# Patient Record
Sex: Female | Born: 1990 | Race: White | Hispanic: No | Marital: Single
Health system: Midwestern US, Academic
[De-identification: ages and names within clinical notes are randomized; demographics above are authoritative.]

## PROBLEM LIST (undated history)

## (undated) DIAGNOSIS — E041 Nontoxic single thyroid nodule: Secondary | ICD-10-CM

## (undated) DIAGNOSIS — R87619 Unspecified abnormal cytological findings in specimens from cervix uteri: Secondary | ICD-10-CM

## (undated) DIAGNOSIS — L309 Dermatitis, unspecified: Secondary | ICD-10-CM

## (undated) DIAGNOSIS — T7840XA Allergy, unspecified, initial encounter: Secondary | ICD-10-CM

## (undated) DIAGNOSIS — Z8489 Family history of other specified conditions: Secondary | ICD-10-CM

## (undated) HISTORY — DX: Dermatitis, unspecified: L30.9

## (undated) HISTORY — DX: Allergy, unspecified, initial encounter: T78.40XA

## (undated) HISTORY — DX: Unspecified abnormal cytological findings in specimens from cervix uteri: R87.619

## (undated) HISTORY — DX: Nontoxic single thyroid nodule: E04.1

## (undated) HISTORY — PX: TYMPANOSTOMY TUBE PLACEMENT: SHX32

---
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
## 2012-05-03 DIAGNOSIS — M67929 Unspecified disorder of synovium and tendon, unspecified upper arm: Secondary | ICD-10-CM

## 2012-05-05 ENCOUNTER — Ambulatory Visit: Payer: PRIVATE HEALTH INSURANCE

## 2012-05-06 ENCOUNTER — Ambulatory Visit: Admit: 2012-05-06 | Payer: PRIVATE HEALTH INSURANCE

## 2012-05-06 DIAGNOSIS — M67929 Unspecified disorder of synovium and tendon, unspecified upper arm: Secondary | ICD-10-CM

## 2012-05-06 NOTE — Unmapped (Signed)
Subjective  HPI:   Patient ID: Debbie Fuller is a 21 y.o. female.    Chief Complaint:  Arm Pain   Incident onset: 05/03/2012. The incident occurred at the gym (after a work out the day prior). There was no injury mechanism. The pain is present in the right forearm and upper right arm. The quality of the pain is described as aching and stabbing. The pain does not radiate. The pain is at a severity of 6/10. The pain is moderate. The pain has been constant since the incident. Pertinent negatives include no chest pain, muscle weakness, numbness or tingling. Exacerbated by: extension of elbow. She has tried NSAIDs for the symptoms. The treatment provided mild relief.                   ROS:   Review of Systems   Constitutional: Negative for fever, chills and diaphoresis.   Respiratory: Negative for cough and shortness of breath.    Cardiovascular: Negative for chest pain and leg swelling.   Gastrointestinal: Negative for nausea and vomiting.   Genitourinary: Negative for dysuria.   Musculoskeletal: Negative for back pain and joint swelling.   Neurological: Negative for tingling and numbness.          Objective:   Physical Exam   Vitals reviewed.  Constitutional: She appears well-nourished. No distress.   HENT:   Head: Normocephalic and atraumatic.   Cardiovascular: Normal rate, regular rhythm and normal heart sounds.    Pulmonary/Chest: Effort normal and breath sounds normal. No respiratory distress. She has no wheezes.   Musculoskeletal:        Right shoulder: Normal.        Left shoulder: Normal.        Right elbow: She exhibits decreased range of motion (decreaased extension due to pain). She exhibits no swelling, no effusion, no deformity and no laceration. tenderness (mild, over distal bicipital tendon) found.        Left elbow: Normal.        Right wrist: Normal.             Filed Vitals:    05/06/12 0932   BP: 86/54   Temp: 98.6 ??F (37 ??C)   TempSrc: Oral   Height: 5' 4.5 (1.638 m)   Weight: 155 lb (70.308  kg)     Body mass index is 26.19 kg/(m^2).  Body surface area is 1.79 meters squared.                Assessment/Plan:     Right bicipital tendinitis  - She has Naprosyn at home, advised her to take two OTC Naprosyn tablets twice a day for 7-10 days and avoid heavy lifting and exercise.

## 2012-05-16 ENCOUNTER — Ambulatory Visit: Admit: 2012-05-16 | Payer: PRIVATE HEALTH INSURANCE

## 2012-05-16 DIAGNOSIS — N926 Irregular menstruation, unspecified: Secondary | ICD-10-CM

## 2012-05-16 MED ORDER — norethindrone-ethinyl estradiol (LOESTRIN 1/20, 21,) 1-20 mg-mcg per tablet
1-20 | PACK | Freq: Every day | ORAL | 1.00 refills | 84.00000 days | Status: AC
Start: 2012-05-16 — End: 2013-04-19

## 2012-05-16 NOTE — Unmapped (Addendum)
Oral Contraceptives  Advantages and Disadvantages  Oral contraceptives (OC's) are the first choice of many women who use birth control. Before starting OC's, a complete history of family and medical problems and a physical exam with a Pap test should be done. Your caregiver may recommend other tests before taking oral contraceptives.  THE ADVANTAGES OF ORAL CONTRACEPTIVES ARE:   ?? They do not interfere with sexual intercourse.   ?? They are the most effective means of birth control (99.5% effective).   ?? They do not affect becoming pregnant when discontinued using them to get pregnant.   ?? They are effective for use in emergency contraception (morning after pill).   ?? They help regulate periods in women with menstrual disorders, and help with severe pain (dysmenorrhea), absence of periods (amenorrhea), and heavy bleeding (menorrhagia).   ?? They can help prevent low red blood cells (anemia) in women who have heavy menstrual periods.   ?? They are helpful in treating polycystic ovary syndrome (multiple tiny cysts on the ovary that do not ovulate and have very irregular menstrual periods).   ?? They are helpful in reducing premenstrual symptoms and for treating endometriosis (uterine tissue misplaced outside its normal location).   ?? There is some evidence that the progestin (synthetic hormone) in OC's may reduce the risk of ovarian, endometrial and possibly colorectal cancers. Progestin seems to be the protective factor in OC's. Some experts believe that women at risk for ovarian cancer might consider oral contraceptives with the highest progestin dose. The longer a woman is on oral contraceptives, the greater the protection.   ?? Progestin OC???s can be taken safely when breastfeeding.   ?? They provide some protection against ectopic pregnancy, ovarian cysts, pelvic inflammatory disease (PID), benign breast lumps, fibrocystic breast disease and rheumatoid arthritis.   ?? Certain low-dose OC's improve acne.   ?? Taking  low-dose OC's in the years before menopause (peri-menopause) may also help protect women against anemia and osteoporosis. The estrogen hormone lowers the risk of osteoporosis.   ?? Although the effect of OC's on bone density is still unclear, some increase in bone density in women taking combination OC's with norethindrone (although not with desogestrel). Low-dose OC's may help prevent bone loss when a woman approaches menopause.   ?? Healthy, nonsmoking women who have frequent physical exams and do not have currant problems taking OC???s can take OC???s up to age 55.   SIDE EFFECTS AND DISADVANTAGES OF ORAL CONTRACEPTIVES  ?? Estrogen and progesterone have different side effects. Women on the combined pill may experience different effects from those on the progestin-only pill. The most serious side effects are due to the estrogen in the combined pill. Women at risk can usually take progestin-only OC's.   ?? Symptoms of serious problems include:   ?? Feeling sick to your stomach (nausea) and vomiting.  ?? Severe abdominal pain.   ?? Chest pain.   ?? Unusual headaches including migraine headaches.   ?? Visual disturbances.   ?? Tiredness and dizziness.  ?? Depression.  ?? Decrease in sex drive (libido).   ?? Amenorrhea (never menstruated by the age of 16).   ?? Breakthrough bleeding.   ?? Yeast infections of the vagina.   ?? Breast tenderness.   ?? Acne.  ?? Patches of brown skin on the face (chloasma).  ?? Rash.   ?? Trouble using contact lenses.   ?? Loss of scalp hair.   ?? Increase hair growth of the face and chest.   ??   Severe pain or swelling in the legs.    ?? OC???s should be used with caution in women over the age of 35.  Severe uncommon complications of OC's include:  ?? High blood pressure can happen. This can usually be corrected by discontinuing the medication. Women who use OC's should not be overly concerned. There is a question if OC's may cause a small but persistent increase in diastolic blood pressure (the second number in a  blood pressure reading or pressure when your heart is resting), which may increase the risk for heart disease years later.   ?? They increase the risk for blood clots. The risk is higher in women with inherited clotting defects. The risk is very slight if there are no other risk factors or if taking anticoagulant therapy.   ?? There may be a higher risk for heart attacks in women taking OC's. Smoking is a significant risk factor in these women. It may not be as big of a risk in healthy women who do not smoke. Chances for a heart attack is also higher in OC users who have high blood pressure, unhealthy cholesterol levels, or both.   ?? There may be a higher than normal risk for stroke in women taking OC's even if women have no other stroke risk factors. Risk of having a stroke is still low. Smoking, migraines and high blood pressure add considerably to the risk.   ?? Use may increase breast or endometrial cancer. If you have a family history of breast cancer and are at a higher risk for developing breast cancer, you should be sure to have regular physical examinations, mammograms and to perform self-examinations every month after your menstrual period.   ?? Use can cause gall bladder disease, benign tumors of the liver, and blood clot in the lungs (pulmonary embolisms).   WOMEN WHO SHOULD NOT TAKE OC???S:  ?? Women who are pregnant, trying to get pregnant or think they may be pregnant.   ?? Women who have a history of blood clots or other blood problems.   ?? Women who have had a heart attack or heart disease should not take the combination estrogen/progesterone OC???s. The progestin only OC may be appropriate with close watching by your caregiver.   ?? Women who have had a stroke.   ?? Women who have migraine headaches.   ?? Women who had yellowing of the skin (jaundice) during pregnancy or with taking OC???s in the past.   ?? Women with undiagnosed vaginal bleeding.   ?? Women who smoke.   ?? Women who have or had a history for breast  or endometrial cancer.   ?? Women who are nursing should not take the combination estrogen/progesterone OC???s.   ?? OC???s should not be used for at least a month before having major surgery.   The risk of having any of these problems is very small.   ?? You should talk with your caregivers to see if OC's are safe for you.   ?? Recommendations for birth control use change continually. There many other options for birth control that are safe and effective depending on your situation and preference. Discuss your choices with your caregivers and decide what is best for you. You may not be able to take certain OC's. Always read the information you are given. Check to find out if there are new recommendations or guidelines for you.   Document Released: 10/03/2004 Document Re-Released: 02/19/2009  Exit[employer]?? Patient Information ??2012 [employer].

## 2012-05-16 NOTE — Unmapped (Signed)
Likely a vagal episode  reassurance

## 2012-05-16 NOTE — Unmapped (Signed)
Trial of ocp's r/b reviewed

## 2012-05-16 NOTE — Unmapped (Signed)
Subjective  HPI:   Patient ID: Debbie Fuller is a 21 y.o. female.    Chief Complaint:  HPI Comments: Here after a spell  Drinking a shake with a friend  Choked and felt out of it  Eyes rolled back  Could hear radio and friend calling her name  Easily came back and had no fatigue or loss of bowel or bladder      Also would like to begin ocp's  For period reguation                  ROS:   Review of Systems   Constitutional: Negative for diaphoresis, activity change, appetite change, fatigue and unexpected weight change.   HENT: Negative for rhinorrhea and postnasal drip.    Respiratory: Negative for shortness of breath.    Cardiovascular: Negative for chest pain.   Musculoskeletal: Negative for back pain and arthralgias.   Neurological: Negative for numbness.   Psychiatric/Behavioral: Negative for dysphoric mood and decreased concentration.   All other systems reviewed and are negative.           Objective:   Physical Exam   Nursing note and vitals reviewed.  Constitutional: She is oriented to person, place, and time. She appears well-developed and well-nourished. No distress.   HENT:   Head: Normocephalic and atraumatic.   Nose: Nose normal.   Mouth/Throat: No oropharyngeal exudate.   Eyes: EOM are normal. Pupils are equal, round, and reactive to light. No scleral icterus.   Neck: Normal range of motion. Neck supple.   Cardiovascular: Normal rate and regular rhythm.    No murmur heard.  Pulmonary/Chest: Effort normal and breath sounds normal. No respiratory distress. She exhibits no tenderness.   Abdominal: Soft. Bowel sounds are normal. She exhibits no distension.   Musculoskeletal: Normal range of motion. She exhibits no edema.   Neurological: She is alert and oriented to person, place, and time.   Skin: Skin is warm and dry. No erythema.   Psychiatric: She has a normal mood and affect. Her behavior is normal. Thought content normal.             Filed Vitals:    05/16/12 1131   BP: 94/56   Pulse: 66      Height: 5' 4.57 (1.64 m)   Weight: 153 lb (69.4 kg)     Body mass index is 25.80 kg/(m^2).  Body surface area is 1.78 meters squared.                Assessment/Plan:     Patient Active Problem List   Diagnosis   ??? Vocal cord disease   ??? Laryngeal spasm   ??? Dyspnea   ??? Routine general medical examination at a health care facility

## 2012-11-02 ENCOUNTER — Ambulatory Visit: Admit: 2012-11-02 | Discharge: 2012-11-02 | Payer: PRIVATE HEALTH INSURANCE | Attending: Adult Health

## 2012-11-02 DIAGNOSIS — D229 Melanocytic nevi, unspecified: Secondary | ICD-10-CM

## 2012-11-02 NOTE — Unmapped (Signed)
Subjective  HPI:   Patient ID: Debbie Fuller is a 21 y.o. female.    Chief Complaint:  Chief Complaint   Patient presents with   ??? FSE       1. Moles on skin surface x many years.  Most have been there as long as she can remember.  All asymptomatic.  Denies any changes.  No prior treatments.  No mod factors.  Denies any personal or family hx of MM.    Derm History: NPV  (- ) Hx NMSC or MM  (- ) sunburns easily  (+ ) uses sunscreen  (- ) history of tanning bed use    ROS:   Review of Systems   Constitutional: Negative for fever and chills.   Respiratory: Negative for cough and shortness of breath.    Neurological: Negative for light-headedness and headaches.   Hematological: Does not bruise/bleed easily.   Reports seasonal allergies      Objective:   Physical Exam  Derm Physical Exam: FSE    Examination was performed of the following were unremarkable: scalp/hair, head/face, conjunctivae/eyelids, gums/teeth/lips, neck, breast/axilla/chest, abdomen, back, RUE, LUE, RLE, LLE, nails/digits and genitalia/groin/buttocks    1. Face, neck, trunk, and extremities with few scattered tan and brown 2-32mm macules and papules     Assessment/Plan:     1. Multiple Nevi  - Education and reassurance  -No atypical features today - rtc for any changes/concerns   -Skin cancer education, ABCDE's reviewed       -Sun protection education, new sunscreen labeling guidelines reviewed  - annual FSE

## 2013-04-19 MED ORDER — GILDESS 1-20 mg-mcg per tablet
1-20 | ORAL_TABLET | ORAL | 3.00 refills | 63.00000 days | Status: AC
Start: 2013-04-19 — End: 2013-05-02

## 2013-05-02 MED ORDER — norethindrone-ethinyl estradiol (GILDESS) 1-20 mg-mcg per tablet
1-20 | ORAL_TABLET | Freq: Every day | ORAL | 1.00 refills | 84.00000 days | Status: AC
Start: 2013-05-02 — End: 2014-06-11

## 2013-05-02 NOTE — Unmapped (Signed)
Pt is wanting to know if we could write for 84 which is 4 pack of the bc pill. pls call pharm back.

## 2013-06-05 NOTE — Unmapped (Signed)
Dr Prescilla Sours would be options for GYN

## 2013-06-05 NOTE — Unmapped (Signed)
Patient's father called and set up a physical for daughter with dr Laural Benes. Patient is scheduled for 07/25/13. Father stating patient would also like a referral to and OB/GN doctor within [facility] preferably a female unless it is booked out far then she may opt for a female

## 2013-06-06 NOTE — Unmapped (Signed)
Pt advised

## 2013-07-25 ENCOUNTER — Ambulatory Visit: Admit: 2013-07-25 | Payer: PRIVATE HEALTH INSURANCE

## 2013-07-25 ENCOUNTER — Inpatient Hospital Stay: Admit: 2013-07-25 | Payer: PRIVATE HEALTH INSURANCE

## 2013-07-25 DIAGNOSIS — Z0000 Encounter for general adult medical examination without abnormal findings: Secondary | ICD-10-CM

## 2013-07-25 LAB — COMPREHENSIVE METABOLIC PANEL, SERUM
ALT: 29 U/L (ref 13–69)
AST (SGOT): 23 U/L (ref 11–53)
Albumin: 4.3 g/dL (ref 3.6–5.1)
Alkaline Phosphatase: 50 U/L (ref 33–115)
Anion Gap: 14 mmol/L (ref 3–16)
BUN: 17 mg/dL (ref 7–25)
CO2: 26 mmol/L (ref 21–33)
Calcium: 9.7 mg/dL (ref 8.6–10.2)
Chloride: 103 mmol/L (ref 98–110)
Creatinine: 0.73 mg/dL (ref 0.50–1.20)
GFR MDRD Af Amer: 123 See note.
GFR MDRD Non Af Amer: 102 See note.
Glucose: 82 mg/dL (ref 65–99)
Osmolality, Calculated: 297 mOsm/kg (ref 278–305)
Potassium: 4.2 mmol/L (ref 3.5–5.3)
Sodium: 143 mmol/L (ref 135–146)
Total Bilirubin: 0.6 mg/dL (ref 0.2–1.2)
Total Protein: 7 g/dL (ref 6.2–8.3)

## 2013-07-25 LAB — LIPID PANEL
Cholesterol, Total: 191 mg/dL (ref 0–200)
HDL: 70 mg/dL (ref 30–60)
LDL Cholesterol: 98 mg/dL (ref 0–160)
Triglycerides: 115 mg/dL (ref 10–150)

## 2013-07-25 MED ORDER — benzoyl peroxide (BPO) 8 % Gel
8 | Freq: Every day | TOPICAL | 3.00 refills | 30.00000 days | Status: AC
Start: 2013-07-25 — End: 2016-09-01

## 2013-07-25 MED ORDER — fluticasone (FLONASE) 50 mcg/actuation nasal spray
50 | Freq: Every day | NASAL | Status: AC
Start: 2013-07-25 — End: 2013-11-28

## 2013-07-25 NOTE — Unmapped (Signed)
Subjective  HPI:   Patient ID: Debbie Fuller is a 22 y.o. female.    Chief Complaint:  HPI Comments: Here for cpe  Feels well  No recent labs             Medications:  Current Outpatient Prescriptions   Medication Sig Dispense Refill   ??? benzoyl peroxide (BPO) 8 % Gel Apply 1 drop topically daily.  90 g  0   ??? cetirizine (ZYRTEC) 10 MG tablet Take 10 mg by mouth daily.       ??? norethindrone-ethinyl estradiol (GILDESS) 1-20 mg-mcg per tablet Take 1 tablet by mouth daily.  84 tablet  2   ??? fluticasone (FLONASE) 50 mcg/actuation nasal spray Use 1 spray into each nostril daily.  16 g  11     No current facility-administered medications for this visit.        ROS:   Review of Systems   Constitutional: Negative for diaphoresis, activity change, appetite change, fatigue and unexpected weight change.   HENT: Negative for rhinorrhea and postnasal drip.    Respiratory: Negative for shortness of breath.    Cardiovascular: Negative for chest pain.   Musculoskeletal: Negative for back pain and arthralgias.   Neurological: Negative for numbness.   Psychiatric/Behavioral: Negative for dysphoric mood and decreased concentration.   All other systems reviewed and are negative.           Objective:   Physical Exam   Nursing note and vitals reviewed.  Constitutional: She is oriented to person, place, and time. She appears well-developed and well-nourished. No distress.   HENT:   Head: Normocephalic and atraumatic.   Right Ear: External ear normal.   Left Ear: External ear normal.   Eyes: Conjunctivae and EOM are normal. Pupils are equal, round, and reactive to light. Right eye exhibits no discharge. Left eye exhibits no discharge.   Neck: Normal range of motion. Neck supple. No thyromegaly present.   Cardiovascular: Normal rate, regular rhythm, normal heart sounds and intact distal pulses.    No murmur heard.  Pulmonary/Chest: Effort normal and breath sounds normal. No respiratory distress.   Normal breast exam no mass       Abdominal: Soft. Bowel sounds are normal. She exhibits no distension. There is no tenderness. There is no rebound.   Musculoskeletal: Normal range of motion.   Neurological: She is alert and oriented to person, place, and time. She has normal reflexes. No cranial nerve deficit.   Skin: Skin is warm and dry.   Psychiatric: She has a normal mood and affect. Her behavior is normal. Thought content normal.             Filed Vitals:    07/25/13 1019   BP: 90/46   Temp: 98.6 ??F (37 ??C)   TempSrc: Oral   Height: 5' 4 (1.626 m)   Weight: 150 lb 3.2 oz (68.13 kg)     Body mass index is 25.77 kg/(m^2).  Body surface area is 1.75 meters squared.                Assessment/Plan:     Patient Active Problem List   Diagnosis   ??? Routine general medical examination at a health care facility   ??? Menstrual irregularity

## 2013-07-25 NOTE — Unmapped (Signed)
Well adult  Obtain fasting labs  We need a copy of immunization records

## 2013-11-28 ENCOUNTER — Ambulatory Visit: Admit: 2013-11-28 | Payer: PRIVATE HEALTH INSURANCE

## 2013-11-28 DIAGNOSIS — N926 Irregular menstruation, unspecified: Secondary | ICD-10-CM

## 2013-11-28 MED ORDER — fluticasone (FLONASE) 50 mcg/actuation nasal spray
50 | Freq: Every day | NASAL | Status: AC
Start: 2013-11-28 — End: ?

## 2013-11-28 MED ORDER — levonorgestrel-ethinyl estradiol (AVIANE) 0.1-20 mg-mcg per tablet
0.1-20 | PACK | Freq: Every day | ORAL | 1.00 refills | 84.00000 days | Status: AC
Start: 2013-11-28 — End: 2014-06-11

## 2013-11-28 NOTE — Unmapped (Signed)
Resume flonase

## 2013-11-28 NOTE — Unmapped (Signed)
Trial of aviane

## 2013-11-28 NOTE — Unmapped (Signed)
Subjective  HPI:   Patient ID: Debbie Fuller is a 22 y.o. female.    Chief Complaint:  HPI Comments: Here for follow up  Accepted in OT school  Needs vaccines  Also discuss birth control  Recently sexually active  Occasional break thru bleeding  Would like to discuss other birth control options    Needs refills on flonase               Medications:  Current Outpatient Prescriptions   Medication Sig Dispense Refill   ??? benzoyl peroxide (BPO) 8 % Gel Apply 1 drop topically daily.  90 g  0   ??? cetirizine (ZYRTEC) 10 MG tablet Take 10 mg by mouth daily.       ??? fluticasone (FLONASE) 50 mcg/actuation nasal spray Use 1 spray into each nostril daily.  48 g  2   ??? levonorgestrel-ethinyl estradiol (AVIANE) 0.1-20 mg-mcg per tablet Take 1 tablet by mouth daily.  3 packet  2   ??? norethindrone-ethinyl estradiol (GILDESS) 1-20 mg-mcg per tablet Take 1 tablet by mouth daily.  84 tablet  2     No current facility-administered medications for this visit.        ROS:   Review of Systems   Constitutional: Negative for diaphoresis, activity change, appetite change, fatigue and unexpected weight change.   HENT: Negative for postnasal drip and rhinorrhea.    Respiratory: Negative for shortness of breath.    Cardiovascular: Negative for chest pain.   Musculoskeletal: Negative for arthralgias and back pain.   Neurological: Negative for numbness.   Psychiatric/Behavioral: Negative for dysphoric mood and decreased concentration.   All other systems reviewed and are negative.           Objective:   Physical Exam   Nursing note and vitals reviewed.  Constitutional: She is oriented to person, place, and time. She appears well-developed and well-nourished. No distress.   HENT:   Head: Normocephalic and atraumatic.   Nose: Nose normal.   Mouth/Throat: No oropharyngeal exudate.   Eyes: EOM are normal. Pupils are equal, round, and reactive to light. No scleral icterus.   Neck: Normal range of motion. Neck supple.   Cardiovascular: Normal  rate and regular rhythm.    No murmur heard.  Pulmonary/Chest: Effort normal and breath sounds normal. No respiratory distress. She exhibits no tenderness.   Abdominal: Soft. Bowel sounds are normal. She exhibits no distension.   Musculoskeletal: Normal range of motion. She exhibits no edema.   Neurological: She is alert and oriented to person, place, and time.   Skin: Skin is warm and dry. No erythema.   Psychiatric: She has a normal mood and affect. Her behavior is normal. Thought content normal.             Filed Vitals:    11/28/13 1106   BP: 130/70   Temp: 98.6 ??F (37 ??C)   TempSrc: Oral   Height: 5' 4 (1.626 m)   Weight: 145 lb (65.772 kg)     Body mass index is 24.88 kg/(m^2).  Body surface area is 1.72 meters squared.                Assessment/Plan:     Patient Active Problem List   Diagnosis   ??? Routine general medical examination at a health care facility   ??? Menstrual irregularity

## 2014-06-11 MED ORDER — levonorgestrel-ethinyl estradiol (LUTERA, 28,) 0.1-20 mg-mcg per tablet
0.1-20 | PACK | Freq: Every day | ORAL | 1.00 refills | 84.00000 days | Status: AC
Start: 2014-06-11 — End: 2015-06-21

## 2014-06-11 NOTE — Unmapped (Signed)
This is her contraception.   Med sent.

## 2014-06-11 NOTE — Unmapped (Signed)
Refill request for Lutera. I do not see this on her med list.

## 2015-06-21 ENCOUNTER — Ambulatory Visit: Admit: 2015-06-21 | Payer: PRIVATE HEALTH INSURANCE

## 2015-06-21 DIAGNOSIS — Q181 Preauricular sinus and cyst: Secondary | ICD-10-CM

## 2015-06-21 MED ORDER — fluocinonide (LIDEX) 0.05 % cream
0.05 | Freq: Two times a day (BID) | TOPICAL | Status: AC
Start: 2015-06-21 — End: ?

## 2015-06-21 NOTE — Unmapped (Signed)
Subjective  HPI:   Patient ID: Debbie Fuller is a 24 y.o. female.    Chief Complaint: Skin issue on ear  HPI         About 1.5 months ago, she had a cyst on right ear. It hasn't resolved. There is a blue tint to it. It is not painful. No discharge. No redness, warmth. Does not wear ear rings.    She didn't take any APAP or NSAID for it.     She has no history of skin cancer.    She has contact dermatitis in posterior knees. Usually uses a steroid cream. She has several environmental allergies, dog and cat allergies. Rash is itchy.       ROS:   Review of Systems   Constitutional: Negative for fever and chills.   HENT: Positive for rhinorrhea. Negative for ear discharge and ear pain.    Respiratory: Negative for cough.           Objective:   Physical Exam   Vitals reviewed.  Constitutional: She appears well-nourished. No distress.   HENT:   On right ear lobe, there is a cyst - non tender, non erythematous, no discharge. It is soft and skin colored.    Skin:   Erythematous maculopapular rash posterior to both knees.             Filed Vitals:    06/21/15 0951   BP: 114/72   Height: 5' 4 (1.626 m)   Weight: 145 lb (65.772 kg)     Body mass index is 24.88 kg/(m^2).  Body surface area is 1.72 meters squared.                Assessment/Plan:           1. Cyst on right ear lobe  - Benign. No signs of infection or abscess. Non tender. Provided reassurance. Educated her on signs of infection.    2. Contact dermatitis due to plants  - Start Lidex cream as needed.

## 2015-07-08 DIAGNOSIS — R87619 Unspecified abnormal cytological findings in specimens from cervix uteri: Secondary | ICD-10-CM

## 2015-07-08 HISTORY — DX: Unspecified abnormal cytological findings in specimens from cervix uteri: R87.619

## 2015-07-30 ENCOUNTER — Ambulatory Visit: Admit: 2015-07-30 | Payer: PRIVATE HEALTH INSURANCE

## 2015-07-30 DIAGNOSIS — Z124 Encounter for screening for malignant neoplasm of cervix: Secondary | ICD-10-CM

## 2015-07-30 MED ORDER — olopatadine (PATANOL) 0.1 % ophthalmic solution
0.1 | Freq: Two times a day (BID) | OPHTHALMIC | 1.00 refills | 25.00000 days | Status: AC
Start: 2015-07-30 — End: ?

## 2015-07-30 NOTE — Unmapped (Signed)
Subjective  HPI:   Patient ID: Debbie Fuller is a 24 y.o. female.    Chief Complaint:  HPI Comments: Here for cpe  Had nexplanon  Had pap then   Had abnormal cells  No periods  Told hpv positive  Did have gardisil               Medications:  Current Outpatient Prescriptions   Medication Sig Dispense Refill   ??? benzoyl peroxide (BPO) 8 % Gel Apply 1 drop topically daily. 90 g 0   ??? cetirizine (ZYRTEC) 10 MG tablet Take 10 mg by mouth daily.     ??? fluocinonide (LIDEX) 0.05 % cream Apply topically 2 times a day. 30 g 2   ??? fluticasone (FLONASE) 50 mcg/actuation nasal spray Use 1 spray into each nostril daily. 48 g 2     No current facility-administered medications for this visit.        ROS:   Review of Systems   Constitutional: Negative for diaphoresis, activity change, appetite change and fatigue.   HENT: Negative for postnasal drip and rhinorrhea.    Respiratory: Negative for shortness of breath.    Cardiovascular: Negative for chest pain.   Musculoskeletal: Negative for back pain and arthralgias.   Neurological: Negative for numbness.   Psychiatric/Behavioral: Negative for dysphoric mood and decreased concentration.   All other systems reviewed and are negative.         Objective:   Physical Exam   Nursing note and vitals reviewed.  Constitutional: She is oriented to person, place, and time. She appears well-developed and well-nourished. No distress.   HENT:   Head: Normocephalic and atraumatic.   Right Ear: External ear normal.   Left Ear: External ear normal.   Eyes: Conjunctivae and EOM are normal. Pupils are equal, round, and reactive to light. Right eye exhibits no discharge. Left eye exhibits no discharge.   Neck: Normal range of motion. Neck supple. No thyromegaly present.   Cardiovascular: Normal rate, regular rhythm, normal heart sounds and intact distal pulses.    No murmur heard.  Pulmonary/Chest: Effort normal and breath sounds normal. No respiratory distress.   Normal breast exam no mass     Abdominal: Soft. Bowel sounds are normal. She exhibits no distension. There is no tenderness. There is no rebound.   Genitourinary: Vagina normal and uterus normal. No vaginal discharge found.   Musculoskeletal: Normal range of motion.   Neurological: She is alert and oriented to person, place, and time. She has normal reflexes. No cranial nerve deficit.   Skin: Skin is warm and dry.   Psychiatric: She has a normal mood and affect. Her behavior is normal. Thought content normal.             Filed Vitals:    07/30/15 1028   BP: 102/80   Height: 5' 4 (1.626 m)   Weight: 140 lb (63.504 kg)     Body mass index is 24.02 kg/(m^2).  Body surface area is 1.69 meters squared.                Assessment/Plan:     Patient Active Problem List   Diagnosis   ??? Routine general medical examination at a health care facility   ??? Menstrual irregularity   ??? Allergic rhinitis        Screening for cervical cancer  Pap sent  Hm utd  Safe sex reviewed

## 2015-07-30 NOTE — Unmapped (Signed)
Pap sent  Hm utd  Safe sex reviewed

## 2016-09-01 ENCOUNTER — Ambulatory Visit: Admit: 2016-09-01 | Discharge: 2016-09-01 | Payer: PRIVATE HEALTH INSURANCE

## 2016-09-01 DIAGNOSIS — Z0000 Encounter for general adult medical examination without abnormal findings: Secondary | ICD-10-CM

## 2016-09-01 NOTE — Assessment & Plan Note (Signed)
Well adult  Hm utd  Update flu today  Obtain record of tetanus

## 2016-09-01 NOTE — Progress Notes (Signed)
[facility]  [facility]  [address]  [place] [address]    Name:  Debbie Fuller Date of Birth: 01/20/1991 (25 y.o.)   MRN: [id number]    Date of Service:  09/01/2016    Subjective   Chief Complaint:     Chief Complaint   Patient presents with    Annual Exam       History of Present Illness:   Debbie Fuller is a(n) 25 y.o. female here today for the following:     Here for cpe  Doing well  Just graduated with OT degree  Feels well          Current Outpatient Medications:  Current Outpatient Prescriptions   Medication Sig Dispense Refill    cetirizine (ZYRTEC) 10 MG tablet Take 10 mg by mouth daily.      fluocinonide (LIDEX) 0.05 % cream Apply topically 2 times a day. 30 g 2    fluticasone (FLONASE) 50 mcg/actuation nasal spray Use 1 spray into each nostril daily. 48 g 2    olopatadine (PATANOL) 0.1 % ophthalmic solution Place 1 drop into both eyes 2 times a day. 5 mL 6     No current facility-administered medications for this visit.          ROS:   Review of Systems   Constitutional: Negative for activity change, appetite change, diaphoresis and fatigue.   HENT: Negative for postnasal drip and rhinorrhea.    Respiratory: Negative for shortness of breath.    Cardiovascular: Negative for chest pain.   Musculoskeletal: Negative for arthralgias and back pain.   Neurological: Negative for numbness.   Psychiatric/Behavioral: Negative for decreased concentration and dysphoric mood.   All other systems reviewed and are negative.         Objective:     Vitals:    09/01/16 1302   BP: 100/70   BP Location: Left arm   Patient Position: Sitting   BP Cuff Size: Regular   Pulse: 96   Weight: 148 lb (67.1 kg)   Height: 5' 4.17 (1.63 m)     Body mass index is 25.27 kg/m.    Physical Exam   Nursing note and vitals reviewed.  Constitutional: She is oriented to person, place, and time. She appears well-developed and well-nourished. No distress.   HENT:   Head:  Normocephalic and atraumatic.   Nose: Nose normal.   Mouth/Throat: No oropharyngeal exudate.   Eyes: EOM are normal. Pupils are equal, round, and reactive to light. No scleral icterus.   Neck: Normal range of motion. Neck supple.   Cardiovascular: Normal rate and regular rhythm.    No murmur heard.  Pulmonary/Chest: Effort normal and breath sounds normal. No respiratory distress. She exhibits no tenderness.   Abdominal: Soft. Bowel sounds are normal. She exhibits no distension.   Musculoskeletal: Normal range of motion. She exhibits no edema.   Neurological: She is alert and oriented to person, place, and time.   Skin: Skin is warm and dry. No erythema.   Psychiatric: She has a normal mood and affect. Her behavior is normal. Thought content normal.             Assessment/Plan:   Debbie Fuller was seen today for annual exam.    Diagnoses and all orders for this visit:    Needs flu shot  -     Flu vaccine greater than or equal to 3yo preservative free Quadrivalent IM  Routine general medical examination at a health care facility    Routine general medical examination at a health care facility  Well adult  Hm utd  Update flu today  Obtain record of tetanus      No Follow-up on file.       Chelsea Aus, MD

## 2016-11-03 ENCOUNTER — Ambulatory Visit: Admit: 2016-11-03 | Discharge: 2016-11-03 | Payer: PRIVATE HEALTH INSURANCE | Attending: Dermatology

## 2016-11-03 DIAGNOSIS — D239 Other benign neoplasm of skin, unspecified: Secondary | ICD-10-CM

## 2016-11-03 NOTE — Patient Instructions (Signed)
OTC -Benzyl peroxide 5% - washing after working out. This can bleach towels, use an old towel. It can burn as well

## 2016-11-03 NOTE — Progress Notes (Signed)
[facility]  [place], [place], [place]       Brenda Fuller  1991/02/16    25 y.o. female     Date of Visit: 11/03/2016    Chief Complaint:   Chief Complaint   Patient presents with   ??? New Patient   ??? Mole     on right side of face, has raised. A month ago, the mole was painful. Not anymore however.         I was asked to see this patient by Dr. No ref. provider found.    History of Present Illness:  Brenda Fuller is a 25 y.o. female who presents with the chief complaint of establish care and the following:    1. Has a raised mole to her right jaw that has been present for decades.  About one month ago, the mole seemed to be irritated and sore.  The symptoms have since ceased.  She denies changes in color, size, or shape  2.  Complains of acne to her back present for years with intermittent flares, worse after exercising. Denies involvement to chest, back, face, shoulders. She requests OTC treatment only.     Review of Systems:  Constitutional: Reports general sense of well-being   Skin: No new or changing moles, no history of keloids or hypertrophic scars.  Heme: No abnormal bruising or bleeding.    Past Medical History, Family History, Surgical History, Medications and Allergies reviewed.    Past Skin Hx:   Patient denies past history of melanoma, NMSC, dysplastic nevi, or chronic skin rashes.    PFHx- maternal grandmother-skin cancer, unsure what type    Family History   Problem Relation Age of Onset   ??? Cancer Maternal Grandmother      skin cancer- unknown     History reviewed. No pertinent past medical history.  History reviewed. No pertinent surgical history.    No Known Allergies  Outpatient Prescriptions Marked as Taking for the 11/03/16 encounter (Office Visit) with Idamae Lusher, DO   Medication Sig Dispense Refill   ??? Cetirizine HCl (ZYRTEC ALLERGY PO) Take by mouth     ??? triamcinolone (KENALOG) 0.1 % ointment Apply topically 2 times daily Apply topically 2 times  daily.         Social History:   Social History     Social History   ??? Marital status: N/A     Spouse name: N/A   ??? Number of children: N/A   ??? Years of education: N/A     Occupational History   ??? Not on file.     Social History Main Topics   ??? Smoking status: Never Smoker   ??? Smokeless tobacco: Never Used   ??? Alcohol use Not on file   ??? Drug use: Unknown   ??? Sexual activity: Not on file     Other Topics Concern   ??? Not on file     Social History Narrative   ??? No narrative on file       Physical Examination     The following were examined and determined to be normal: Psych/Neuro, Conjunctivae/eyelids, Gums/teeth/lips, Neck, Breast/axilla/chest, RUE and LUE.    The following were examined and determined to be abnormal: Head/face and Back.     -General: NAD, well-nourished, well-developed.  -Areas of skin examined as listed above:   1. Right posterior jaw- 0.7x0.6cm well circumscribed brown with uniform pigment and regular borders    2. Few scattered inflammatory  papules to lower and upper back    Assessment and Plan     1. Dermal nevus    2. Acne vulgaris    3. Non-tobacco user        1. Dermal nevus  -Patient declined biopsy today  -Plan: Observation, patient to return in 6 months to monitor changes   - pt to call if changes in size, shape, color or experiences bleeding/pain/itching    2. Acne vulgaris  -recommend OTC benzoyl peroxide 5% wash daily when in shower.   educated regarding the potential for irritation and the risk of bleaching clothing/towels.  -Counseled patient hat acne takes 3-6 months at least to see a significant difference with treatment  -Only wash with mild gentle soap such as Cetaphil and shower after workouts.     3. Non-tobacco user  -continue with tobacco cessation        Return in about 6 months (around 05/03/2017) for mole.

## 2017-05-07 ENCOUNTER — Encounter: Attending: Dermatology

## 2017-07-19 ENCOUNTER — Ambulatory Visit (INDEPENDENT_AMBULATORY_CARE_PROVIDER_SITE_OTHER): Payer: 59 | Admitting: Family Medicine

## 2017-07-19 ENCOUNTER — Encounter: Payer: Self-pay | Admitting: Family Medicine

## 2017-07-19 VITALS — BP 100/70 | HR 63 | Resp 12 | Ht 64.37 in | Wt 147.1 lb

## 2017-07-19 DIAGNOSIS — Z131 Encounter for screening for diabetes mellitus: Secondary | ICD-10-CM

## 2017-07-19 DIAGNOSIS — Z0000 Encounter for general adult medical examination without abnormal findings: Secondary | ICD-10-CM

## 2017-07-19 DIAGNOSIS — J309 Allergic rhinitis, unspecified: Secondary | ICD-10-CM | POA: Diagnosis not present

## 2017-07-19 DIAGNOSIS — L309 Dermatitis, unspecified: Secondary | ICD-10-CM

## 2017-07-19 DIAGNOSIS — M79646 Pain in unspecified finger(s): Secondary | ICD-10-CM

## 2017-07-19 LAB — POCT GLYCOSYLATED HEMOGLOBIN (HGB A1C): Hemoglobin A1C: 5.1

## 2017-07-19 MED ORDER — TRIAMCINOLONE ACETONIDE 0.1 % EX CREA: 1.0000 "application " | TOPICAL_CREAM | Freq: Two times a day (BID) | CUTANEOUS | 2 refills | Status: DC

## 2017-07-19 MED ORDER — FLUTICASONE PROPIONATE 50 MCG/ACT NA SUSP: 1.0000 | Freq: Every day | NASAL | 6 refills | Status: DC

## 2017-07-19 MED FILL — TRIAMCINOLONE 0.1% CREAM: 0.1 | 7 days supply | Qty: 45 | Fill #0

## 2017-07-19 MED FILL — FLUTICASONE PROP 50 MCG SPR: 50 | 60 days supply | Qty: 16 | Fill #0

## 2017-07-19 NOTE — Patient Instructions (Addendum)
A few things to remember from today's visit:   Routine general medical examination at a health care facility  Diabetes mellitus screening - Plan: POCT glycosylated hemoglobin (Hb A1C)  Eczema, unspecified type - Plan: triamcinolone cream (KENALOG) 0.1 %  Allergic rhinitis, unspecified seasonality, unspecified trigger - Plan: fluticasone (FLONASE) 50 MCG/ACT nasal spray    At least 150 minutes of moderate exercise per week, daily brisk walking for 15-30 min is a good exercise option. Healthy diet low in saturated (animal) fats and sweets and consisting of fresh fruits and vegetables, lean meats such as fish and white chicken and whole grains.   - Vaccines:  Tdap vaccine every 10 years.  Shingles vaccine recommended at age 24, could be given after 24 years of age but not sure about insurance coverage.  Pneumonia vaccines:  Prevnar 13 at 65 and Pneumovax at 66.  Screening recommendations for low/normal risk women:  Screening for diabetes at age 24-45 and every 3 years.  Cervical cancer prevention:  -HPV vaccination between 69-24 years old. -Pap smear starts at 24 years of age and continues periodically until 24 years old in low risk women. Pap smear every 3 years between 7721 and 24 years old. Pap smear every 3 years between women 30 and older if pap smear negative and HPV screening negative.   -Breast cancer: Mammogram: There is disagreement between experts about when to start screening in low risk asymptomatic female but recent recommendations are to start screening at 5740 and not later than 24 years old , every 1-2 years and after 24 yo q 2 years. Screening is recommended until 24 years old but some women can continue screening depending of healthy issues.   Colon cancer screening: starts at 24 years old until 24 years old.  Cholesterol disorder screening at age 945 and every 3 years.  Also recommended:  1. Dental visit- Brush and floss your teeth twice daily; visit your  dentist twice a year. 2. Eye doctor- Get an eye exam at least every 2 years. 3. Helmet use- Always wear a helmet when riding a bicycle, motorcycle, rollerblading or skateboarding. 4. Safe sex- If you may be exposed to sexually transmitted infections, use a condom. 5. Seat belts- Seat belts can save your live; always wear one. 6. Smoke/Carbon Monoxide detectors- These detectors need to be installed on the appropriate level of your home. Replace batteries at least once a year. 7. Skin cancer- When out in the sun please cover up and use sunscreen 15 SPF or higher. 8. Violence- If anyone is threatening or hurting you, please tell your healthcare provider.  9. Drink alcohol in moderation- Limit alcohol intake to one drink or less per day. Never drink and drive.  Please be sure medication list is accurate. If a new problem present, please set up appointment sooner than planned today.

## 2017-07-19 NOTE — Progress Notes (Signed)
HPI:   [name] is a 26 y.o. female, who is here today to establish care.  Former PCP: Dr Laural Benes in [place]. Moved to this area in 12/2016. Last preventive routine visit: 07/2016.  Chronic medical problems: Atopic dermatitis and allergic rhinitis. She takes Zyrtec 10 mg and intranasal Flonase for allergic rhinitis.  Triamcinolone oint as needed for rash on ant aspect of elbow, post aspect of knees, upper trunk.   Reporting pap smear in 2015 some "abnormal cells", 2016 pap and HPV negative. Hx of STD: Denies. Birth controlled method: Nexplanon. This has caused irregular vaginal spotting.  LMP 02/23/2017.  Concerns today: She doesn't have any concerns today, she would like a CPE. She has an appointment with gynecologist next week.  He lives alone.  She exercises regularly and follows a healthy diet. She just started with [facility], occupational therapy.   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 Now having occasionally right thumb pain radiated to right forearm. Pain is mild to moderate sometimes, depending of activity. It is exacerbated by prolonged typing and alleviated by rest. It is intermittent, she has not had numbness or tingling. Right handed. No Hx of trauma, erythema,edema,or limitation of ROM.   Review of Systems  Constitutional: Negative for activity change, appetite change, fatigue, fever and unexpected weight change.  HENT: Negative for hearing loss, mouth sores, nosebleeds, sore throat, trouble swallowing and voice change.   Eyes: Negative for redness and visual disturbance.  Respiratory: Negative for cough, shortness of breath and wheezing.   Cardiovascular: Negative for chest pain and leg swelling.  Gastrointestinal: Negative for abdominal pain, nausea and vomiting.       No changes in bowel habits.  Endocrine: Negative for cold intolerance, heat intolerance, polydipsia, polyphagia and polyuria.    Genitourinary: Positive for menstrual problem (irregular). Negative for decreased urine volume, dysuria, hematuria, vaginal bleeding and vaginal discharge.  Musculoskeletal: Positive for arthralgias. Negative for gait problem and neck pain.  Skin: Positive for rash. Negative for color change.  Allergic/Immunologic: Positive for environmental allergies.  Neurological: Negative for syncope, weakness, numbness and headaches.  Hematological: Negative for adenopathy.  Psychiatric/Behavioral: Negative for confusion and sleep disturbance. The patient is not nervous/anxious.   All other systems reviewed and are negative.   No current outpatient prescriptions on file prior to visit.   No current facility-administered medications on file prior to visit.      Past Medical History:  Diagnosis Date  . Allergy   . Eczema    No Known Allergies  Family History  Problem Relation Age of Onset  . Arthritis Mother   . Diabetes Father   . Cancer Father        prostate  . Heart disease Maternal Grandmother   . Stroke Paternal Grandmother   . Hypertension Paternal Grandmother     Social History   Social History  . Marital status: Single    Spouse name: N/A  . Number of children: N/A  . Years of education: N/A   Social History Main Topics  . Smoking status: Never Smoker  . Smokeless tobacco: Never Used  . Alcohol use Yes     Comment: occasional  . Drug use: No  . Sexual activity: Not Asked   Other Topics Concern  . None   Social History Narrative  . None    Vitals:   07/19/17 0653  BP: 100/70  Pulse: 63  Resp: 12  SpO2: 99%  Body mass index is 24.96 kg/m.  Physical Exam  Nursing note and vitals reviewed. Constitutional: She is oriented to person, place, and time. She appears well-developed and well-nourished. No distress.  HENT:  Head: Normocephalic and atraumatic.  Right Ear: Hearing, external ear and ear canal normal. Tympanic membrane is scarred.  Left Ear:  Hearing, external ear and ear canal normal. Tympanic membrane is scarred.  Mouth/Throat: Uvula is midline, oropharynx is clear and moist and mucous membranes are normal.  Eyes: Pupils are equal, round, and reactive to light. Conjunctivae and EOM are normal.  Neck: No tracheal deviation present. No thyromegaly present.  Cardiovascular: Normal rate and regular rhythm.   No murmur heard. Pulses:      Dorsalis pedis pulses are 2+ on the right side, and 2+ on the left side.  Respiratory: Effort normal and breath sounds normal. No respiratory distress.  GI: Soft. She exhibits no mass. There is no hepatomegaly. There is no tenderness.  Genitourinary:  Genitourinary Comments: Deferred to gyn.  Musculoskeletal: She exhibits no edema.       Right wrist: She exhibits normal range of motion and no tenderness.       Left wrist: She exhibits normal range of motion and no tenderness.       Right hand: She exhibits normal range of motion and no tenderness.       Left hand: She exhibits normal range of motion and no tenderness.  No major deformity or signs of synovitis appreciated. Tinel and Phalen test negative. Finkelstein maneuver does not elicit pain, bilateral.   Lymphadenopathy:    She has no cervical adenopathy.       Right: No supraclavicular adenopathy present.       Left: No supraclavicular adenopathy present.  Neurological: She is alert and oriented to person, place, and time. She has normal strength. No cranial nerve deficit. Coordination and gait normal.  Reflex Scores:      Bicep reflexes are 2+ on the right side and 2+ on the left side.      Patellar reflexes are 2+ on the right side and 2+ on the left side. Skin: Skin is warm. Rash noted. No erythema.  Erythematous, micropapular anterior to left axilla, not tender and no local heat or drainage.    Psychiatric: She has a normal mood and affect. Her speech is normal. Cognition and memory are normal.  Well groomed, good eye contact.     ASSESSMENT AND PLAN:   [name] was seen today for establish care and annual exam.  Diagnoses and all orders for this visit:  Lab Results  Component Value Date   HGBA1C 5.1 07/19/2017    Routine general medical examination at a health care facility  We discussed the importance of regular physical activity and healthy diet for prevention of chronic illness and/or complications. Preventive guidelines reviewed. Vaccination up to date, per pt report. She will keep appt with gyn. Next CPE in 1-3 years.  Diabetes mellitus screening -     POCT glycosylated hemoglobin (Hb A1C)  Eczema, unspecified type  Recommend cream instead oint and to use bid as needed. Some side effects of topical steroid discussed. F/U as needed.  -     triamcinolone cream (KENALOG) 0.1 %; Apply 1 application topically 2 (two) times daily.  Allergic rhinitis, unspecified seasonality, unspecified trigger  Stable. No changes in current management. F/U in 12 months.  -     fluticasone (FLONASE) 50 MCG/ACT nasal spray; Place 1 spray into both  nostrils daily.  Thumb pain, unspecified laterality  Asymptomatic at this time. If possible try to avoid trigger factors. OTC NSAID's may help. F/U as needed.      Angelica Pottle G. Swazil[place], MD  [facility]. [place] office.

## 2017-07-22 ENCOUNTER — Encounter: Payer: Self-pay | Admitting: Family Medicine

## 2017-08-02 ENCOUNTER — Other Ambulatory Visit (HOSPITAL_COMMUNITY)
Admission: RE | Admit: 2017-08-02 | Discharge: 2017-08-02 | Disposition: A | Discharge status: Home / Self Care | Payer: 59 | Source: Ambulatory Visit | Attending: Obstetrics and Gynecology | Admitting: Obstetrics and Gynecology

## 2017-08-02 ENCOUNTER — Encounter: Payer: Self-pay | Admitting: Obstetrics and Gynecology

## 2017-08-02 ENCOUNTER — Ambulatory Visit (INDEPENDENT_AMBULATORY_CARE_PROVIDER_SITE_OTHER): Payer: 59 | Admitting: Obstetrics and Gynecology

## 2017-08-02 VITALS — BP 118/70 | HR 90 | Resp 14 | Ht 63.5 in | Wt 145.6 lb

## 2017-08-02 DIAGNOSIS — Z01419 Encounter for gynecological examination (general) (routine) without abnormal findings: Secondary | ICD-10-CM | POA: Diagnosis not present

## 2017-08-02 DIAGNOSIS — Z113 Encounter for screening for infections with a predominantly sexual mode of transmission: Secondary | ICD-10-CM | POA: Diagnosis not present

## 2017-08-02 DIAGNOSIS — Z3009 Encounter for other general counseling and advice on contraception: Secondary | ICD-10-CM

## 2017-08-02 NOTE — Patient Instructions (Signed)

## 2017-08-02 NOTE — Progress Notes (Addendum)
26 y.o. G0P0000 Single Caucasian female here for annual exam.    Patient would like to discuss birth control options. Nexplanon due to expire 10/2017. Happy with Nexplanon and cycles are irregular and light.  No cycle for several months.   Would like general STD testing.   FH uterine didelphys.   From [place].  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  Boyfriend in state of [place].   PCP:  Betty [place], MD   Patient's last menstrual period was 03/21/2017 (exact date).     Period Cycle (Days):  (usually no cycle with Nexplanon)     Sexually active: Yes.    The current method of family planning is Nexplanon inserted 10-26-14 in left arm.    Exercising: Yes.    Weights, yoga biking and rowing Smoker:  no  Health Maintenance: Pap: 07/2016 Neg, 07/2015 Neg:Pos HR HPV. History of abnormal Pap:  Yes, 07/2015 Neg:Pos HR HPV--no colposcopy, no treatment MMG:  n/a Colonoscopy:  n/a BMD:   n/a  Result  n/a TDaP: thinks 2018 Gardasil:   yes HIV: 2016 Neg Hep C: 2016 Neg Screening Labs:    Urine today: not done   reports that she has never smoked. She has never used smokeless tobacco. She reports that she drinks about 0.6 oz of alcohol per week . She reports that she does not use drugs.  Past Medical History:  Diagnosis Date  . Abnormal Pap smear of cervix 07/2015   Neg/Pos HR HPV--in [place]--no colpo/no treatment  . Allergy   . Eczema     No past surgical history on file.  Current Outpatient Prescriptions  Medication Sig Dispense Refill  . cetirizine (ZYRTEC) 10 MG tablet Take 10 mg by mouth daily.    Marland Kitchen etonogestrel (NEXPLANON) 68 MG IMPL implant 1 each by Subdermal route once.    . fluticasone (FLONASE) 50 MCG/ACT nasal spray Place 1 spray into both nostrils daily. 16 g 6  . triamcinolone cream (KENALOG) 0.1 % Apply 1 application topically 2 (two) times daily. 45 g 2   No current facility-administered medications for this visit.     Family History  Problem Relation Age  of Onset  . Arthritis Mother   . Hyperlipidemia Mother   . Diabetes Father   . Cancer Father        prostate  . Thyroid disease Father        hypothyroid  . Heart disease Maternal Grandmother   . Hyperlipidemia Maternal Grandmother   . Stroke Maternal Grandmother   . Stroke Paternal Grandmother   . Hypertension Paternal Grandmother   . Cancer Paternal Grandfather 24       Dec colon cancer    ROS:  Pertinent items are noted in HPI.  Otherwise, a comprehensive ROS was negative.  Exam:   BP 118/70 (BP Location: Right Arm, Patient Position: Sitting, Cuff Size: Normal)   Pulse 90   Resp 14   Ht 5' 3.5" (1.613 m)   Wt 145 lb 9.6 oz (66 kg)   LMP 03/21/2017 (Exact Date)   BMI 25.39 kg/m     General appearance: alert, cooperative and appears stated age Head: Normocephalic, without obvious abnormality, atraumatic Neck: no adenopathy, supple, symmetrical, trachea midline and thyroid normal to inspection and palpation Lungs: clear to auscultation bilaterally Breasts: normal appearance, no masses or tenderness, No nipple retraction or dimpling, No nipple discharge or bleeding, No axillary or supraclavicular adenopathy Heart: regular rate and rhythm Abdomen: soft, non-tender; no masses, no organomegaly Extremities: extremities  normal, atraumatic, no cyanosis or edema.  Nexplanon palpable in left arm, intact. Skin: Skin color, texture, turgor normal. No rashes or lesions Lymph nodes: Cervical, supraclavicular, and axillary nodes normal. No abnormal inguinal nodes palpated Neurologic: Grossly normal  Pelvic: External genitalia:  no lesions              Urethra:  normal appearing urethra with no masses, tenderness or lesions              Bartholins and Skenes: normal                 Vagina: normal appearing vagina with normal color and discharge, no lesions              Cervix: no lesions              Pap taken: Yes.   Bimanual Exam:  Uterus:  normal size, contour, position,  consistency, mobility, non-tender              Adnexa: no mass, fullness, tenderness                Chaperone was present for exam.  Assessment:   Well woman visit with normal exam. Hx positive HR HPV and normal pap.  Desire for STD testing.  Plan: Mammogram screening discussed. Recommended self breast awareness. Pap and HR HPV as above. Guidelines for Calcium, Vitamin D, regular exercise program including cardiovascular and weight bearing exercise. STD screening.  Return for Nexplanon removal and replacement. Follow up annually and prn.   After visit summary provided.

## 2017-08-03 LAB — HEPATITIS C ANTIBODY

## 2017-08-03 LAB — HEP, RPR, HIV PANEL
HIV SCREEN 4TH GENERATION: NONREACTIVE
Hepatitis B Surface Ag: NEGATIVE
RPR Ser Ql: NONREACTIVE

## 2017-08-04 LAB — CYTOLOGY - PAP
CHLAMYDIA, DNA PROBE: NEGATIVE
Diagnosis: NEGATIVE
Neisseria Gonorrhea: NEGATIVE
Trichomonas: NEGATIVE

## 2017-09-20 ENCOUNTER — Telehealth: Payer: Self-pay | Admitting: Obstetrics and Gynecology

## 2017-09-20 NOTE — Telephone Encounter (Signed)
Patient called requesting to schedule Nexplanon removal and reinsertion.

## 2017-09-20 NOTE — Telephone Encounter (Signed)
Spoke with patient. Patient is ready to schedule nexplanon removal and reinsertion. Nexplanon was inserted on 11/20/215. Appointment scheduled for 10/13/2017 at 10 am with Dr.Silva. Patient is agreeable to date and time.  Routing to provider for final review. Patient agreeable to disposition. Will close encounter.

## 2017-09-21 ENCOUNTER — Telehealth: Payer: Self-pay | Admitting: Obstetrics and Gynecology

## 2017-09-21 NOTE — Telephone Encounter (Signed)
Patient called to cancel nexplanon removal and nexplanon insertion on 10/13/17. Patient has rescheduled on 10/20/17 with Dr Edward Jolly.   cc: Dr Edward Jolly

## 2017-09-22 NOTE — Telephone Encounter (Signed)
I have reviewed this phone message and closed the encounter.  

## 2017-10-13 ENCOUNTER — Ambulatory Visit: Payer: 59 | Admitting: Obstetrics and Gynecology

## 2017-10-20 ENCOUNTER — Ambulatory Visit (INDEPENDENT_AMBULATORY_CARE_PROVIDER_SITE_OTHER): Payer: 59 | Admitting: Obstetrics and Gynecology

## 2017-10-20 VITALS — BP 114/70 | HR 70 | Ht 63.5 in | Wt 146.0 lb

## 2017-10-20 DIAGNOSIS — Z30017 Encounter for initial prescription of implantable subdermal contraceptive: Secondary | ICD-10-CM

## 2017-10-20 DIAGNOSIS — Z3046 Encounter for surveillance of implantable subdermal contraceptive: Secondary | ICD-10-CM | POA: Diagnosis not present

## 2017-10-20 DIAGNOSIS — Z01812 Encounter for preprocedural laboratory examination: Secondary | ICD-10-CM | POA: Diagnosis not present

## 2017-10-20 DIAGNOSIS — Z3009 Encounter for other general counseling and advice on contraception: Secondary | ICD-10-CM

## 2017-10-20 LAB — POCT URINE PREGNANCY: PREG TEST UR: NEGATIVE

## 2017-10-20 NOTE — Patient Instructions (Signed)
Nexplanon Instructions After Insertion  Keep bandage clean and dry for 24 hours  May use ice/Tylenol/Ibuprofen for soreness or pain  If you develop fever, drainage or increased warmth from incision site-contact office immediately   

## 2017-10-20 NOTE — Progress Notes (Signed)
GYNECOLOGY  VISIT   HPI: 26 y.o.   Single  Caucasian  female   G0P0000 with Patient's last menstrual period was 10/20/2017 (exact date).   here for Nexplanon removal and reinsertion.    UPT: Negative  GYNECOLOGIC HISTORY: Patient's last menstrual period was 10/20/2017 (exact date). Contraception:  Nexplanon inserted 10-26-14 Menopausal hormone therapy:  n/a Last mammogram:  n/a Last pap smear:  08-02-17 Neg, 07/2016 Neg        OB History    Gravida Para Term Preterm AB Living   0 0 0 0 0 0   SAB TAB Ectopic Multiple Live Births   0 0 0 0 0         Patient Active Problem List   Diagnosis Date Noted  . Eczema 07/19/2017  . Allergic rhinitis 07/19/2017    Past Medical History:  Diagnosis Date  . Abnormal Pap smear of cervix 07/2015   Neg/Pos HR HPV--in [place]--no colpo/no treatment  . Allergy   . Eczema     No past surgical history on file.  Current Outpatient Medications  Medication Sig Dispense Refill  . cetirizine (ZYRTEC) 10 MG tablet Take 10 mg by mouth daily.    Marland Kitchen. etonogestrel (NEXPLANON) 68 MG IMPL implant 1 each by Subdermal route once.    . fluticasone (FLONASE) 50 MCG/ACT nasal spray Place 1 spray into both nostrils daily. 16 g 6  . triamcinolone cream (KENALOG) 0.1 % Apply 1 application topically 2 (two) times daily. 45 g 2   No current facility-administered medications for this visit.      ALLERGIES: Patient has no known allergies.  Family History  Problem Relation Age of Onset  . Arthritis Mother   . Hyperlipidemia Mother   . Diabetes Father   . Cancer Father        prostate  . Thyroid disease Father        hypothyroid  . Heart disease Maternal Grandmother   . Hyperlipidemia Maternal Grandmother   . Stroke Maternal Grandmother   . Stroke Paternal Grandmother   . Hypertension Paternal Grandmother   . Cancer Paternal Grandfather [id number]       Dec colon cancer    Social History   Socioeconomic History  . Marital status: Single    Spouse  name: Not on file  . Number of children: Not on file  . Years of education: Not on file  . Highest education level: Not on file  Social Needs  . Financial resource strain: Not on file  . Food insecurity - worry: Not on file  . Food insecurity - inability: Not on file  . Transportation needs - medical: Not on file  . Transportation needs - non-medical: Not on file  Occupational History  . Not on file  Tobacco Use  . Smoking status: Never Smoker  . Smokeless tobacco: Never Used  Substance and Sexual Activity  . Alcohol use: Yes    Alcohol/week: 0.6 oz    Types: 1 Cans of beer per week  . Drug use: No  . Sexual activity: Yes    Partners: Male    Birth control/protection: Implant    Comment: Nexplanon inserted 10-26-14/Lt.arm  Other Topics Concern  . Not on file  Social History Narrative  . Not on file    ROS:  Pertinent items are noted in HPI.  PHYSICAL EXAMINATION:    BP 114/70 (BP Location: Right Arm, Patient Position: Sitting, Cuff Size: Normal)   Pulse 70   Ht 5' 3.5" (  1.613 m)   Wt 146 lb (66.2 kg)   LMP 10/20/2017 (Exact Date)   BMI 25.46 kg/m     General appearance: alert, cooperative and appears stated age   Nexplanon removal and reinsertion of new Nexplanon.  New Nexplanon - lot [id number], exp 12/2019. Consent for procedures. Nexplanon palpated in left arm. Sterile prep with betadine.  Local 1% lidocaine to left arm. Lot number [id number], exp 11/21. Incision over insertion site of last Nexplanon.  Hemostat used to grasp Nexplanon, intact, shown to patient, and discarded.  New Nexplanon inserted without difficulty.  Palpated by clinician and patient.  No complications.  Minimal EBL.  ASSESSMENT  Nexplanon removal and insertion new Nexplanon.   PLAN  Post Nexplanon insertion instructions reviewed.  Use back up protection for at least one week.  Follow up prn and for annual exams.   An After Visit Summary was printed and given to the patient.

## 2017-12-14 IMAGING — US US THYROID
1 series · 13 of 25 positions shown · non-contrast
Comparison: None.

CLINICAL DATA: Palpable abnormality. 27-year-old female with
thyromegaly

EXAM:
THYROID ULTRASOUND
TECHNIQUE: Ultrasound examination of the thyroid gland and adjacent soft
tissues was performed.

[Series 1: us thyroid · 0.06mm/px · 13 of 63 slices shown]
[im 1/63]
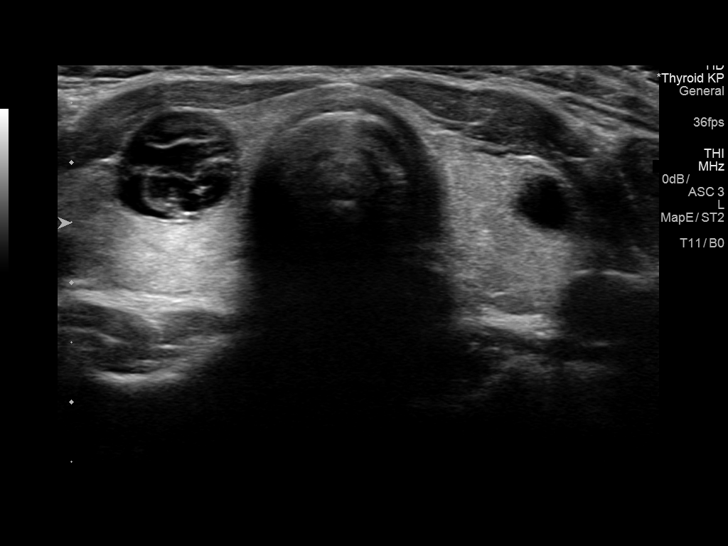
[im 6/63]
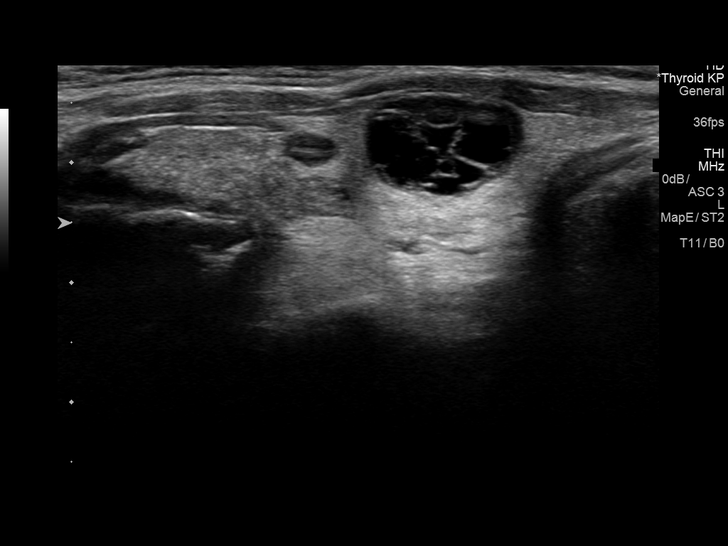
[im 11/63]
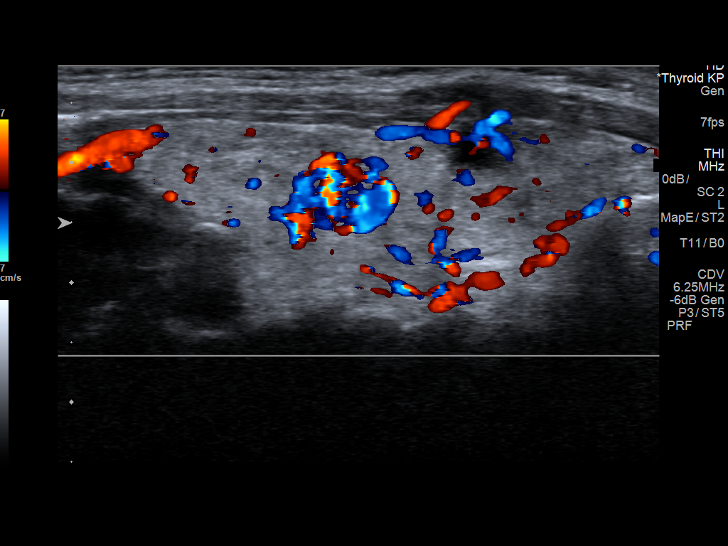
[im 16/63]
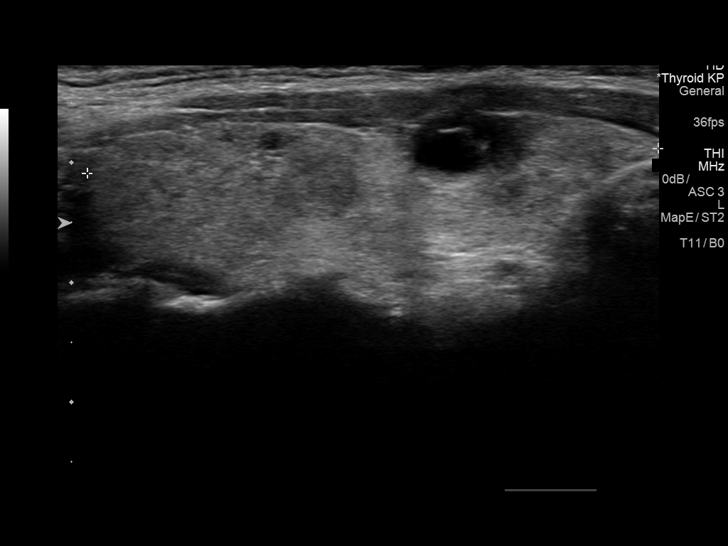
[im 21/63]
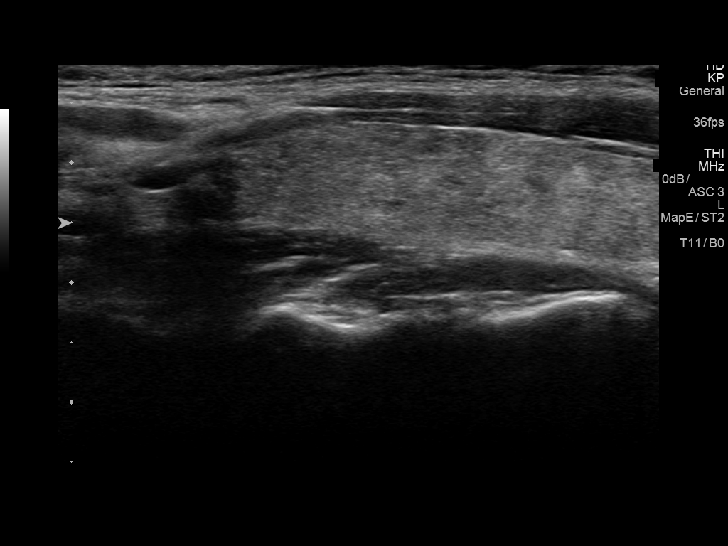
[im 26/63]
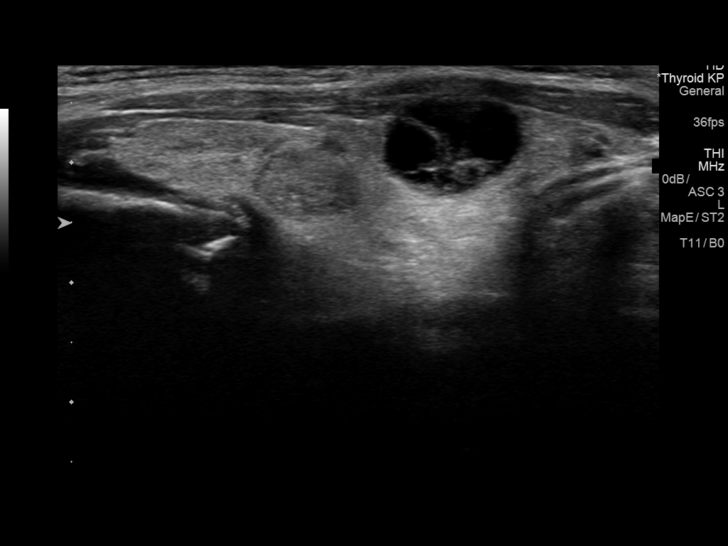
[im 32/63]
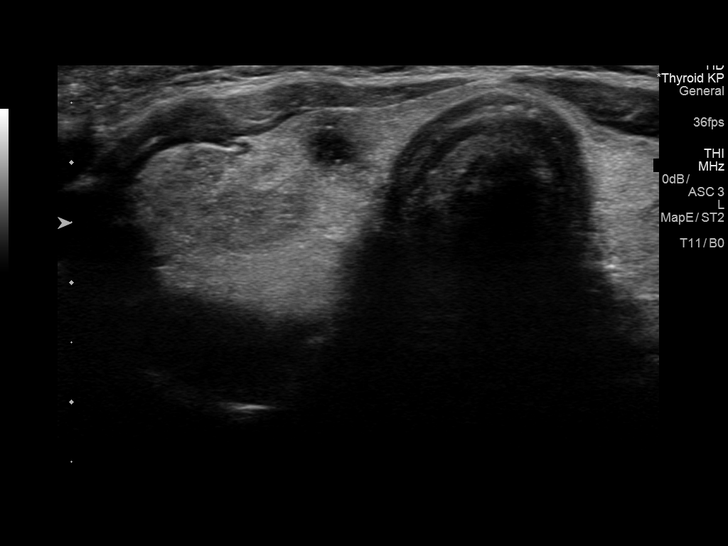
[im 37/63]
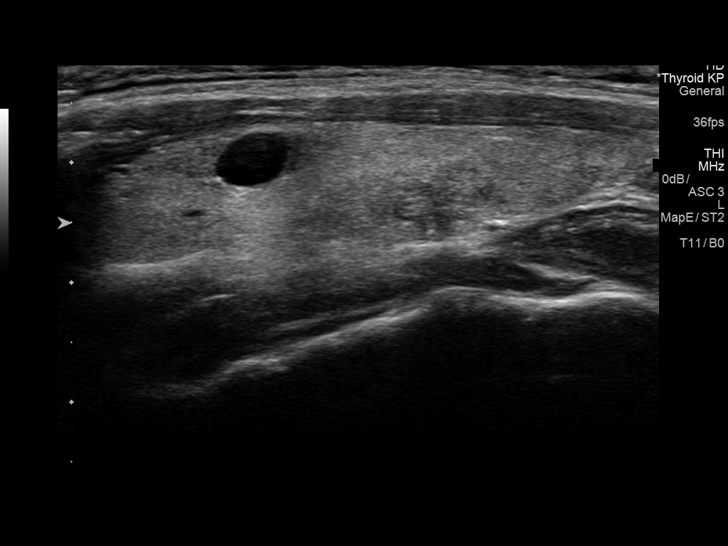
[im 42/63]
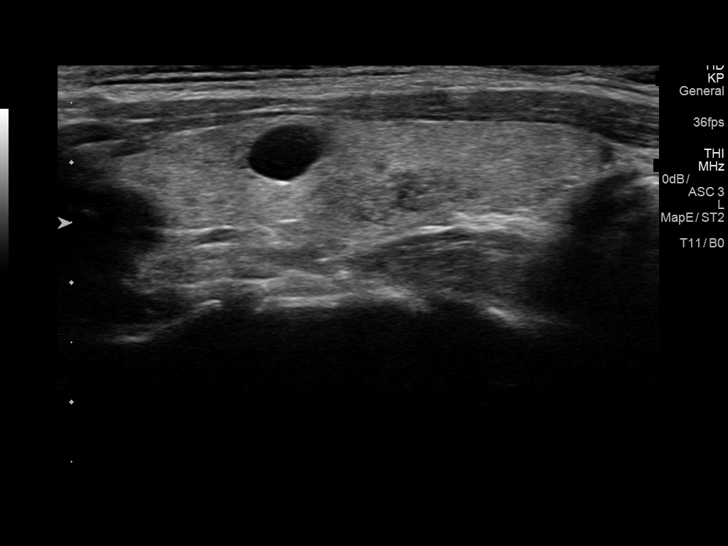
[im 47/63]
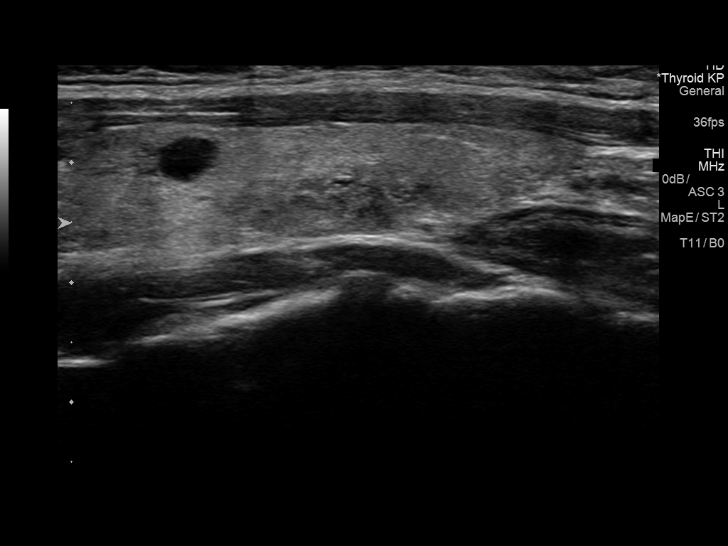
[im 52/63]
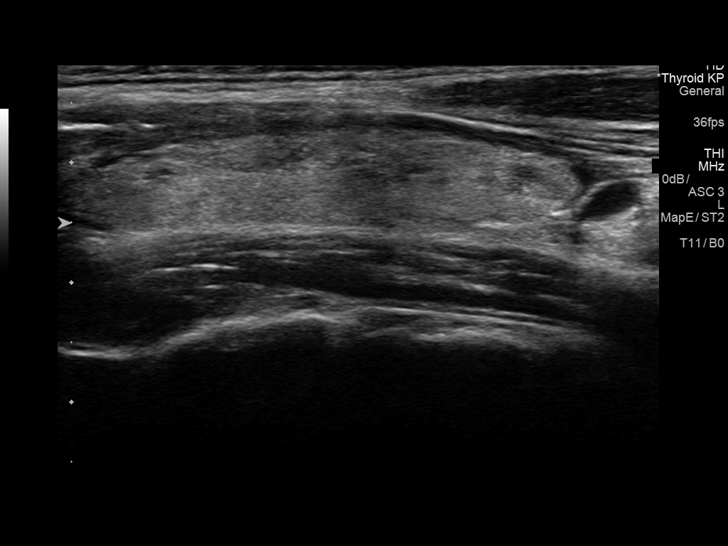
[im 57/63]
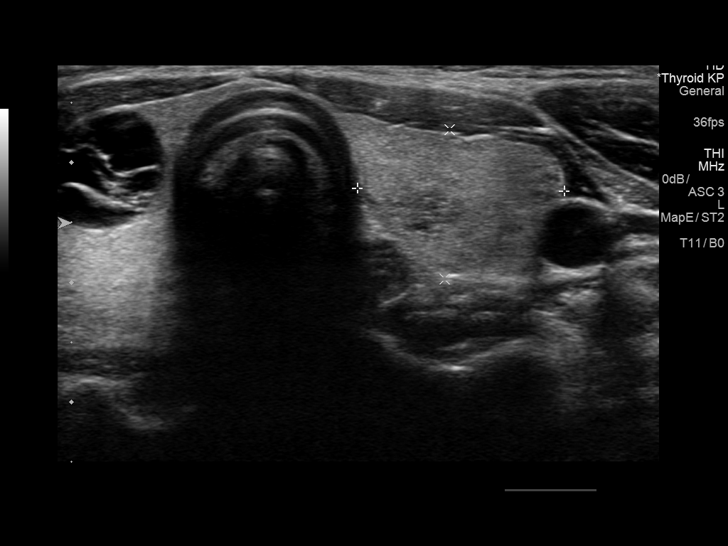
[im 63/63]
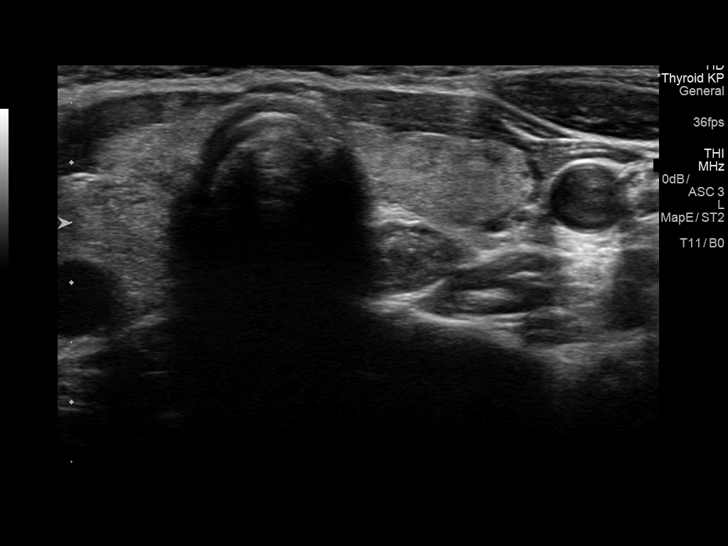

[13 of 25 positions shown; findings below may reference images not displayed]

FINDINGS: Parenchymal Echotexture: Mildly heterogenous

Isthmus: 0.2 cm

Right lobe: 4.8 x 1.7 x 2.1 cm

Left lobe: 4.6 x 1.3 x 1.7 cm

_________________________________________________________

Estimated total number of nodules >/= 1 cm: 2

Number of spongiform nodules >/=  2 cm not described below (TR1): 0

Number of mixed cystic and solid nodules >/= 1.5 cm not described
below (TR2): 0

_________________________________________________________

Mildly exophytic spongiform nodule measuring up to 1.4 cm in the
medial aspect of the right upper thyroid gland may be palpable on
physical exam. The spongiform morphology is consistent with a benign
nodule and this TI-RADS category 2 lesion requires no further
follow-up.

Additional small bilateral thyroid nodules are identified including
a 0.9 cm isoechoic to minimally hypoechoic nodule in the right mid
gland. This nodule does not meet criteria for further evaluation.

Finally, there is a small anechoic simple cyst in the left mid gland
which does not meet criteria for further evaluation.
IMPRESSION: Bilateral thyroid cysts and nodules which are either sonographically
benign or do not meet criteria for further evaluation.

Of note, a slightly exophytic TI-RADS category 2 spongiform nodule
in the medial aspect of the right upper gland could potentially be
palpable on physical exam given its superficial location.

The thyroid gland itself is normal in size.

The above is in keeping with the ACR TI-RADS recommendations - [HOSPITAL] [WW];[DATE].

## 2018-03-06 ENCOUNTER — Encounter: Payer: Self-pay | Admitting: Family Medicine

## 2018-03-07 ENCOUNTER — Ambulatory Visit (INDEPENDENT_AMBULATORY_CARE_PROVIDER_SITE_OTHER): Payer: No Typology Code available for payment source | Admitting: Family Medicine

## 2018-03-07 ENCOUNTER — Encounter: Payer: Self-pay | Admitting: Family Medicine

## 2018-03-07 VITALS — BP 110/60 | HR 73 | Temp 98.3°F | Resp 12 | Ht 63.5 in | Wt 140.4 lb

## 2018-03-07 DIAGNOSIS — R05 Cough: Secondary | ICD-10-CM | POA: Diagnosis not present

## 2018-03-07 DIAGNOSIS — J309 Allergic rhinitis, unspecified: Secondary | ICD-10-CM | POA: Diagnosis not present

## 2018-03-07 DIAGNOSIS — R053 Chronic cough: Secondary | ICD-10-CM

## 2018-03-07 MED ORDER — MONTELUKAST SODIUM 10 MG PO TABS: 10.0000 mg | ORAL_TABLET | Freq: Every day | ORAL | 0 refills | Status: DC

## 2018-03-07 MED FILL — MONTELUKAST SOD 10 MG TAB: 10 | 90 days supply | Qty: 90 | Fill #0

## 2018-03-07 NOTE — Patient Instructions (Signed)
A few things to remember from today's visit:   Persistent cough  Allergic rhinitis, unspecified seasonality, unspecified trigger - Plan: montelukast (SINGULAIR) 10 MG tablet  Cough after upper respiratory infection can last a few weeks or longer depending of history of allergies. Today I do not hear pneumonia or bronchitis in your lungs, so I will obtain antibiotic as needed.  Please let me know in about a week if you want a chest x-ray. Singulair 10 mg at bedtime added today.  Please be sure medication list is accurate. If a new problem present, please set up appointment sooner than planned today.

## 2018-03-07 NOTE — Progress Notes (Signed)
ACUTE VISIT  HPI:  Chief Complaint  Patient presents with  . Chest congestion    started 3 weeks ago, taking OTC medications    Angelica Norman is a 27 y.o.female here today complaining of 3 weeks of respiratory symptoms. Productive cough with greenish mucus, she feels like it is getting worse. She tried OTC medications, Mucinex and Sudafed, discontinued because she did not feel like it was helping.  Initially she had sore throat, nasal congestion, and rhinorrhea.  These symptoms have resolved. Cough seems to be worse in the morning, when she also has yellowish sputum.  She denies hemoptysis. Intermittent dysphonia, worse at the end of the day.  She has no noted fever, chills, dyspnea, wheezing, or chest pain.  Negative for heartburn or abdominal pain. History of allergic rhinitis, she is currently on Zyrtec 10 mg daily and Flonase nasal spray.   Cough  This is a new problem. The current episode started 1 to 4 weeks ago. The problem has been waxing and waning. The problem occurs every few hours. The cough is productive of sputum. Associated symptoms include nasal congestion, postnasal drip and rhinorrhea. Pertinent negatives include no chest pain, chills, ear congestion, ear pain, eye redness, fever, headaches, heartburn, hemoptysis, myalgias, rash, sore throat, shortness of breath, weight loss or wheezing. The symptoms are aggravated by pollens. She has tried OTC cough suppressant for the symptoms. The treatment provided mild relief. Her past medical history is significant for environmental allergies.    No Hx of recent travel. No sick contact. No known insect bite.   Symptoms otherwise stable.   Review of Systems  Constitutional: Negative for activity change, appetite change, chills, fatigue, fever and weight loss.  HENT: Positive for congestion, postnasal drip, rhinorrhea and voice change. Negative for ear pain, mouth sores, sinus pressure, sore  throat and trouble swallowing.   Eyes: Negative for discharge, redness and itching.  Respiratory: Positive for cough. Negative for hemoptysis, shortness of breath and wheezing.   Cardiovascular: Negative for chest pain.  Gastrointestinal: Negative for abdominal pain, diarrhea, heartburn, nausea and vomiting.  Musculoskeletal: Negative for gait problem and myalgias.  Skin: Negative for rash.  Allergic/Immunologic: Positive for environmental allergies.  Neurological: Negative for weakness and headaches.  Hematological: Negative for adenopathy. Does not bruise/bleed easily.      Current Outpatient Medications on File Prior to Visit  Medication Sig Dispense Refill  . cetirizine (ZYRTEC) 10 MG tablet Take 10 mg by mouth daily.    Marland Kitchen. etonogestrel (NEXPLANON) 68 MG IMPL implant 1 each by Subdermal route once.    . fluticasone (FLONASE) 50 MCG/ACT nasal spray Place 1 spray into both nostrils daily. 16 g 6  . triamcinolone cream (KENALOG) 0.1 % Apply 1 application topically 2 (two) times daily. (Patient taking differently: Apply 1 application topically as needed. ) 45 g 2   No current facility-administered medications on file prior to visit.      Past Medical History:  Diagnosis Date  . Abnormal Pap smear of cervix 07/2015   Neg/Pos HR HPV--in [place]--no colpo/no treatment  . Allergy   . Eczema    No Known Allergies  Social History   Socioeconomic History  . Marital status: Single    Spouse name: Not on file  . Number of children: Not on file  . Years of education: Not on file  . Highest education level: Not on file  Occupational History  . Not on file  Social Needs  .  Financial resource strain: Not on file  . Food insecurity:    Worry: Not on file    Inability: Not on file  . Transportation needs:    Medical: Not on file    Non-medical: Not on file  Tobacco Use  . Smoking status: Never Smoker  . Smokeless tobacco: Never Used  Substance and Sexual Activity  . Alcohol  use: Yes    Alcohol/week: 0.6 oz    Types: 1 Cans of beer per week  . Drug use: No  . Sexual activity: Yes    Partners: Male    Birth control/protection: Implant    Comment: Nexplanon inserted 10-26-14/Lt.arm  Lifestyle  . Physical activity:    Days per week: Not on file    Minutes per session: Not on file  . Stress: Not on file  Relationships  . Social connections:    Talks on phone: Not on file    Gets together: Not on file    Attends religious service: Not on file    Active member of club or organization: Not on file    Attends meetings of clubs or organizations: Not on file    Relationship status: Not on file  Other Topics Concern  . Not on file  Social History Narrative  . Not on file    Vitals:   03/07/18 1524  BP: 110/60  Pulse: 73  Resp: 12  Temp: 98.3 F (36.8 C)  SpO2: 100%   Body mass index is 24.48 kg/m.      Physical Exam  Nursing note and vitals reviewed. Constitutional: She is oriented to person, place, and time. She appears well-developed and well-nourished. She does not appear ill. No distress.  HENT:  Head: Normocephalic and atraumatic.  Right Ear: Tympanic membrane, external ear and ear canal normal.  Left Ear: Tympanic membrane, external ear and ear canal normal.  Nose: Rhinorrhea present. Right sinus exhibits no maxillary sinus tenderness and no frontal sinus tenderness. Left sinus exhibits no maxillary sinus tenderness and no frontal sinus tenderness.  Mouth/Throat: Oropharynx is clear and moist and mucous membranes are normal.  Hypertrophic turbinates. Postnasal drainage.  Eyes: Conjunctivae are normal.  Cardiovascular: Normal rate and regular rhythm.  No murmur heard. Respiratory: Effort normal and breath sounds normal. No respiratory distress.  Lymphadenopathy:       Head (right side): No submandibular adenopathy present.       Head (left side): No submandibular adenopathy present.    She has no cervical adenopathy.  Neurological:  She is alert and oriented to person, place, and time. She has normal strength.  Skin: Skin is warm. No rash noted. No erythema.  Psychiatric: She has a normal mood and affect. Her speech is normal.  Well groomed, good eye contact.     ASSESSMENT AND PLAN:  Angelica Norman was seen today for chest congestion.  Diagnoses and all orders for this visit:  Persistent cough  We discussed possible etiologies: Residual symptoms from recent URI, GI, and allergies among some. Lung auscultation today is negative for wheezing, rhonchi, or rales.  History does not suggest a serious respiratory process. She prefers to hold on imaging for now. CXR to be considered if symptoms are still persistent in 1-2 weeks.  Allergic rhinitis, unspecified seasonality, unspecified trigger  This problem could be aggravating cough. Continue Zyrtec 10 mg daily and Flonase nasal spray. Saline nasal irrigations as needed will also help. We will add Singulair 10 mg at bedtime.  -  montelukast (SINGULAIR) 10 MG tablet; Take 1 tablet (10 mg total) by mouth at bedtime.      -Angelica Norman advised to seek attention immediately if symptoms worsen or to follow if they persist or new concerns arise.       Angelica Franze G. [place], MD  [facility]. [place] office.

## 2018-03-07 NOTE — Assessment & Plan Note (Signed)
This problem could be contributing to cough. She agrees on adding Singulair 10 mg at bedtime. She will let me know if new medication helps. Continue Zyrtec 10 mg daily and Flonase nasal spray. Nasal irrigation with saline as needed also recommended.

## 2018-07-07 DIAGNOSIS — E041 Nontoxic single thyroid nodule: Secondary | ICD-10-CM

## 2018-07-07 HISTORY — DX: Nontoxic single thyroid nodule: E04.1

## 2018-07-13 ENCOUNTER — Encounter: Payer: Self-pay | Admitting: Family Medicine

## 2018-07-13 ENCOUNTER — Ambulatory Visit (INDEPENDENT_AMBULATORY_CARE_PROVIDER_SITE_OTHER): Payer: No Typology Code available for payment source | Admitting: Family Medicine

## 2018-07-13 VITALS — BP 112/66 | HR 75 | Temp 98.1°F | Resp 12 | Ht 63.5 in | Wt 140.2 lb

## 2018-07-13 DIAGNOSIS — Z131 Encounter for screening for diabetes mellitus: Secondary | ICD-10-CM | POA: Diagnosis not present

## 2018-07-13 DIAGNOSIS — Z0000 Encounter for general adult medical examination without abnormal findings: Secondary | ICD-10-CM

## 2018-07-13 DIAGNOSIS — E049 Nontoxic goiter, unspecified: Secondary | ICD-10-CM

## 2018-07-13 DIAGNOSIS — Z13220 Encounter for screening for lipoid disorders: Secondary | ICD-10-CM

## 2018-07-13 LAB — BASIC METABOLIC PANEL
BUN: 19 mg/dL (ref 6–23)
CALCIUM: 9.4 mg/dL (ref 8.4–10.5)
CO2: 28 meq/L (ref 19–32)
Chloride: 104 mEq/L (ref 96–112)
Creatinine, Ser: 0.7 mg/dL (ref 0.40–1.20)
GFR: 108.39 mL/min (ref >60.00)
Glucose, Bld: 82 mg/dL (ref 70–99)
Potassium: 4.2 mEq/L (ref 3.5–5.1)
SODIUM: 139 meq/L (ref 135–145)

## 2018-07-13 LAB — LIPID PANEL
CHOL/HDL RATIO: 3
CHOLESTEROL: 135 mg/dL (ref 0–200)
HDL: 48 mg/dL (ref >39.00)
LDL CALC: 75 mg/dL (ref 0–99)
NONHDL: 86.56
Triglycerides: 57 mg/dL (ref 0.0–149.0)
VLDL: 11.4 mg/dL (ref 0.0–40.0)

## 2018-07-13 LAB — TSH: TSH: 1.33 u[IU]/mL (ref 0.35–4.50)

## 2018-07-13 NOTE — Patient Instructions (Signed)
A few things to remember from today's visit:   Routine general medical examination at a health care facility  Screening for lipid disorders - Plan: Lipid panel  Diabetes mellitus screening - Plan: Basic metabolic panel  Enlarged thyroid gland - Plan: TSH  Today you have you routine preventive visit.  At least 150 minutes of moderate exercise per week, daily brisk walking for 15-30 min is a good exercise option. Healthy diet low in saturated (animal) fats and sweets and consisting of fresh fruits and vegetables, lean meats such as fish and white chicken and whole grains.  These are some of recommendations for screening depending of age and risk factors:   - Vaccines:  Tdap vaccine every 10 years.  Shingles vaccine recommended at age 25, could be given after 25 years of age but not sure about insurance coverage.   Pneumonia vaccines:  Prevnar 13 at 65 and Pneumovax at 66. Sometimes Pneumovax is giving earlier if history of smoking, lung disease,diabetes,kidney disease among some.    Screening for diabetes at age 940 and every 3 years.  Cervical cancer prevention:  Pap smear starts at 25 years of age and continues periodically until 25 years old in low risk women. Pap smear every 3 years between 5321 and 25 years old. Pap smear every 3-5 years between women 30 and older if pap smear negative and HPV screening negative.   -Breast cancer: Mammogram: There is disagreement between experts about when to start screening in low risk asymptomatic female but recent recommendations are to start screening at 3840 and not later than 25 years old , every 1-2 years and after 25 yo q 2 years. Screening is recommended until 25 years old but some women can continue screening depending of healthy issues.   Colon cancer screening: starts at 25 years old until 25 years old.  Cholesterol disorder screening at age 25 and every 3 years.  Also recommended:  1. Dental visit- Brush and floss your teeth  twice daily; visit your dentist twice a year. 2. Eye doctor- Get an eye exam at least every 2 years. 3. Helmet use- Always wear a helmet when riding a bicycle, motorcycle, rollerblading or skateboarding. 4. Safe sex- If you may be exposed to sexually transmitted infections, use a condom. 5. Seat belts- Seat belts can save your live; always wear one. 6. Smoke/Carbon Monoxide detectors- These detectors need to be installed on the appropriate level of your home. Replace batteries at least once a year. 7. Skin cancer- When out in the sun please cover up and use sunscreen 15 SPF or higher. 8. Violence- If anyone is threatening or hurting you, please tell your healthcare provider.  9. Drink alcohol in moderation- Limit alcohol intake to one drink or less per day. Never drink and drive.  Please be sure medication list is accurate. If a new problem present, please set up appointment sooner than planned today.

## 2018-07-13 NOTE — Progress Notes (Signed)
HPI:   Angelica Norman is a 27 y.o. female, who is here today for her routine physical.  Last CPE: 07/19/2017  Regular exercise 3 or more time per week: Yes,doing yoga 3/week and wt lifting 4/week. Following a healthy diet: Yes. She lives with her boyfriend.  Chronic medical problems: Allergic rhinitis, eczema, and irregular menses.  Pap smear 07/2017, she follows regularly with gynecologist.  Hx of STD's: Negative   There is no immunization history on file for this patient.  She would like lab work today,she thinks she will need biometric labs for insurance. She has no concerns today.    Review of Systems  Constitutional: Negative for appetite change, fatigue, fever and unexpected weight change.  HENT: Negative for dental problem, hearing loss, mouth sores, sore throat, trouble swallowing and voice change.   Eyes: Negative for redness and visual disturbance.  Respiratory: Negative for cough, shortness of breath and wheezing.   Cardiovascular: Negative for chest pain and leg swelling.  Gastrointestinal: Negative for abdominal pain, blood in stool, nausea and vomiting.       No changes in bowel habits.  Endocrine: Negative for cold intolerance, heat intolerance, polydipsia, polyphagia and polyuria.  Genitourinary: Negative for decreased urine volume, dysuria, hematuria, vaginal bleeding and vaginal discharge.  Musculoskeletal: Negative for gait problem and myalgias.  Skin: Negative for color change and rash.  Allergic/Immunologic: Positive for environmental allergies.  Neurological: Negative for syncope, weakness and headaches.  Hematological: Negative for adenopathy. Does not bruise/bleed easily.  Psychiatric/Behavioral: Negative for confusion and sleep disturbance. The patient is not nervous/anxious.   All other systems reviewed and are negative.     Current Outpatient Medications on File Prior to Visit  Medication Sig Dispense Refill  .  cetirizine (ZYRTEC) 10 MG tablet Take 10 mg by mouth daily.    Marland Kitchen etonogestrel (NEXPLANON) 68 MG IMPL implant 1 each by Subdermal route once.    . fluticasone (FLONASE) 50 MCG/ACT nasal spray Place 1 spray into both nostrils daily. 16 g 6  . montelukast (SINGULAIR) 10 MG tablet Take 1 tablet (10 mg total) by mouth at bedtime. 90 tablet 0  . triamcinolone cream (KENALOG) 0.1 % Apply 1 application topically 2 (two) times daily. (Patient taking differently: Apply 1 application topically as needed. ) 45 g 2   No current facility-administered medications on file prior to visit.      Past Medical History:  Diagnosis Date  . Abnormal Pap smear of cervix 07/2015   Neg/Pos HR HPV--in [place]--no colpo/no treatment  . Allergy   . Eczema     History reviewed. No pertinent surgical history.  No Known Allergies  Family History  Problem Relation Age of Onset  . Arthritis Mother   . Hyperlipidemia Mother   . Diabetes Father   . Cancer Father        prostate  . Thyroid disease Father        hypothyroid  . Heart disease Maternal Grandmother   . Hyperlipidemia Maternal Grandmother   . Stroke Maternal Grandmother   . Stroke Paternal Grandmother   . Hypertension Paternal Grandmother   . Cancer Paternal Grandfather 56       Dec colon cancer    Social History   Socioeconomic History  . Marital status: Single    Spouse name: Not on file  . Number of children: Not on file  . Years of education: Not on file  . Highest education level: Not on file  Occupational  History  . Not on file  Social Needs  . Financial resource strain: Not on file  . Food insecurity:    Worry: Not on file    Inability: Not on file  . Transportation needs:    Medical: Not on file    Non-medical: Not on file  Tobacco Use  . Smoking status: Never Smoker  . Smokeless tobacco: Never Used  Substance and Sexual Activity  . Alcohol use: Yes    Alcohol/week: 1.0 standard drinks    Types: 1 Cans of beer per week    . Drug use: No  . Sexual activity: Yes    Partners: Male    Birth control/protection: Implant    Comment: Nexplanon inserted 10-26-14/Lt.arm  Lifestyle  . Physical activity:    Days per week: Not on file    Minutes per session: Not on file  . Stress: Not on file  Relationships  . Social connections:    Talks on phone: Not on file    Gets together: Not on file    Attends religious service: Not on file    Active member of club or organization: Not on file    Attends meetings of clubs or organizations: Not on file    Relationship status: Not on file  Other Topics Concern  . Not on file  Social History Narrative  . Not on file     Vitals:   07/13/18 0827  BP: 112/66  Pulse: 75  Resp: 12  Temp: 98.1 F (36.7 C)  SpO2: 99%   Body mass index is 24.45 kg/m.   Wt Readings from Last 3 Encounters:  07/13/18 140 lb 4 oz (63.6 kg)  03/07/18 140 lb 6 oz (63.7 kg)  10/20/17 146 lb (66.2 kg)      Physical Exam  Nursing note and vitals reviewed. Constitutional: She is oriented to person, place, and time. She appears well-developed. No distress.  HENT:  Head: Normocephalic and atraumatic.  Right Ear: Hearing, external ear and ear canal normal. Tympanic membrane is scarred.  Left Ear: Hearing, external ear and ear canal normal. Tympanic membrane is scarred.  Mouth/Throat: Uvula is midline, oropharynx is clear and moist and mucous membranes are normal.  Eyes: Pupils are equal, round, and reactive to light. Conjunctivae and EOM are normal.  Neck: No tracheal deviation present. Thyromegaly (? Right thyroid nodule.) present.    Cardiovascular: Normal rate and regular rhythm.  No murmur heard. Pulses:      Dorsalis pedis pulses are 2+ on the right side, and 2+ on the left side.  Respiratory: Effort normal and breath sounds normal. No respiratory distress.  GI: Soft. She exhibits no mass. There is no hepatomegaly. There is no tenderness.  Musculoskeletal: She exhibits no  edema or tenderness.  No major deformity or signs of synovitis appreciated.  Lymphadenopathy:    She has no cervical adenopathy.       Right: No supraclavicular adenopathy present.       Left: No supraclavicular adenopathy present.  Neurological: She is alert and oriented to person, place, and time. She has normal strength. No cranial nerve deficit. Coordination and gait normal.  Reflex Scores:      Bicep reflexes are 2+ on the right side and 2+ on the left side.      Patellar reflexes are 2+ on the right side and 2+ on the left side. Skin: Skin is warm. No rash noted. No erythema.  Psychiatric: She has a normal mood and affect. Cognition  and memory are normal.  Well groomed, good eye contact.      ASSESSMENT AND PLAN:  Angelica Norman was here today annual physical examination.     Orders Placed This Encounter  Procedures  . US THYROID  . Lipid panel  . TSH  . Basic metabolic panel    Lab Results  Component Value Date   TSH 1.33 07/13/2018   Lab Results  Component Value Date   CHOL 135 07/13/2018   HDL 48.00 07/13/2018   LDLCALC 75 07/13/2018   TRIG 57.0 07/13/2018   CHOLHDL 3 07/13/2018   Lab Results  Component Value Date   CREATININE 0.70 07/13/2018   BUN 19 07/13/2018   NA 139 07/13/2018   K 4.2 07/13/2018   CL 104 07/13/2018   CO2 28 07/13/2018    Routine general medical examination at a health care facility  We discussed the importance of regular physical activity and healthy diet for prevention of chronic illness and/or complications. Preventive guidelines reviewed. Vaccination up to date. She will continue female preventive care with gyn. If she has a form to be completed she can drop it off. Next CPE in a year.  Screening for lipid disorders -     Lipid panel  Diabetes mellitus screening -     Basic metabolic panel  Enlarged thyroid gland  ? Small nodule right thyroid lobe. Further recommendations will be given  according to labs/imaging results.  -     TSH -     US THYROID; Future    Return in 1 year (on 07/14/2019) for CPE.     Erick Murin G. [place], MD  [facility]. [place] office.

## 2018-07-17 ENCOUNTER — Encounter: Payer: Self-pay | Admitting: Family Medicine

## 2018-07-26 ENCOUNTER — Encounter: Payer: No Typology Code available for payment source | Admitting: Family Medicine

## 2018-07-28 ENCOUNTER — Ambulatory Visit
Admission: RE | Admit: 2018-07-28 | Discharge: 2018-07-28 | Disposition: A | Discharge status: Home / Self Care | Payer: No Typology Code available for payment source | Source: Ambulatory Visit | Attending: Family Medicine | Admitting: Family Medicine

## 2018-07-28 DIAGNOSIS — E049 Nontoxic goiter, unspecified: Secondary | ICD-10-CM

## 2018-08-01 ENCOUNTER — Encounter: Payer: Self-pay | Admitting: Family Medicine

## 2018-08-10 ENCOUNTER — Other Ambulatory Visit: Payer: Self-pay

## 2018-08-10 ENCOUNTER — Ambulatory Visit (INDEPENDENT_AMBULATORY_CARE_PROVIDER_SITE_OTHER): Payer: No Typology Code available for payment source | Admitting: Obstetrics and Gynecology

## 2018-08-10 ENCOUNTER — Other Ambulatory Visit (HOSPITAL_COMMUNITY)
Admission: RE | Admit: 2018-08-10 | Discharge: 2018-08-10 | Disposition: A | Discharge status: Home / Self Care | Payer: No Typology Code available for payment source | Source: Ambulatory Visit | Attending: Obstetrics and Gynecology | Admitting: Obstetrics and Gynecology

## 2018-08-10 ENCOUNTER — Encounter: Payer: Self-pay | Admitting: Obstetrics and Gynecology

## 2018-08-10 VITALS — BP 100/58 | HR 84 | Resp 16 | Ht 64.5 in | Wt 142.2 lb

## 2018-08-10 DIAGNOSIS — N926 Irregular menstruation, unspecified: Secondary | ICD-10-CM

## 2018-08-10 DIAGNOSIS — Z113 Encounter for screening for infections with a predominantly sexual mode of transmission: Secondary | ICD-10-CM

## 2018-08-10 DIAGNOSIS — Z01419 Encounter for gynecological examination (general) (routine) without abnormal findings: Secondary | ICD-10-CM | POA: Diagnosis present

## 2018-08-10 LAB — POCT URINE PREGNANCY: Preg Test, Ur: NEGATIVE

## 2018-08-10 NOTE — Patient Instructions (Signed)

## 2018-08-10 NOTE — Progress Notes (Addendum)
27 y.o. G67P0000 Single Caucasian female here for annual exam.    Having irregular menses. Bleeding for 2.5 - 3 weeks during the last month.  This is inconvenient for her.  Had this happen with her prior Nexplanon.   Using condoms as back up protection.   Hx of irregular menses prior to Nexplanon.  Used OCPs in the past.   Had benign thyroid nodules.   Does hot yoga.   Boyfriend moved up from [place].  UPT negative.   PCP:  Betty [place]   Patient's last menstrual period was 08/05/2018.           Sexually active: Yes.    The current method of family planning is Nexplanon placed 10/20/17.    Exercising: Yes.    yoga, weight lifting, cardio Smoker:  no  Health Maintenance: Pap: 08/02/17 Negative; 07/2016 Negative History of abnormal Pap:  Yes, 07/2015 Neg:Pos HR HPV--no colposcopy, no treatment TDaP:  Current  Gardasil:   yes HIV and Hep C: 08/02/17 Negative Screening Labs: PCP   reports that she has never smoked. She has never used smokeless tobacco. She reports that she drinks about 2.0 standard drinks of alcohol per week. She reports that she does not use drugs.  Past Medical History:  Diagnosis Date  . Abnormal Pap smear of cervix 07/2015   Neg/Pos HR HPV--in [place]--no colpo/no treatment  . Allergy   . Eczema   . Thyroid nodule 07/2018    History reviewed. No pertinent surgical history.  Current Outpatient Medications  Medication Sig Dispense Refill  . cetirizine (ZYRTEC) 10 MG tablet Take 10 mg by mouth daily.    Marland Kitchen etonogestrel (NEXPLANON) 68 MG IMPL implant 1 each by Subdermal route once.    . fluticasone (FLONASE) 50 MCG/ACT nasal spray Place 1 spray into both nostrils daily. 16 g 6  . montelukast (SINGULAIR) 10 MG tablet Take 1 tablet (10 mg total) by mouth at bedtime. 90 tablet 0  . triamcinolone cream (KENALOG) 0.1 % Apply 1 application topically 2 (two) times daily. (Patient taking differently: Apply 1 application topically as needed. ) 45 g 2   No current  facility-administered medications for this visit.     Family History  Problem Relation Age of Onset  . Arthritis Mother   . Hyperlipidemia Mother   . Diabetes Father   . Cancer Father        prostate  . Thyroid disease Father        hypothyroid  . Heart disease Maternal Grandmother   . Hyperlipidemia Maternal Grandmother   . Stroke Maternal Grandmother   . Stroke Paternal Grandmother   . Hypertension Paternal Grandmother   . Cancer Paternal Grandfather 58       Dec colon cancer    Review of Systems  Genitourinary:       Menstrual cycle changes Unscheduled bleeding or spotting   All other systems reviewed and are negative.   Exam:   BP (!) 100/58 (BP Location: Right Arm, Patient Position: Sitting, Cuff Size: Large)   Pulse 84   Resp 16   Ht 5' 4.5" (1.638 m)   Wt 142 lb 3.2 oz (64.5 kg)   LMP 08/05/2018   BMI 24.03 kg/m     General appearance: alert, cooperative and appears stated age Head: Normocephalic, without obvious abnormality, atraumatic Neck: no adenopathy, supple, symmetrical, trachea midline and thyroid enlarged, right greaster than left, nontender.  Lungs: clear to auscultation bilaterally Breasts: normal appearance, no masses or tenderness, No nipple  retraction or dimpling, No nipple discharge or bleeding, No axillary or supraclavicular adenopathy Heart: regular rate and rhythm Abdomen: soft, non-tender; no masses, no organomegaly Extremities: extremities normal, atraumatic, no cyanosis or edema.  Nexplanon present in left arm.   Skin: Skin color, texture, turgor normal. No rashes or lesions Lymph nodes: Cervical, supraclavicular, and axillary nodes normal. No abnormal inguinal nodes palpated Neurologic: Grossly normal  Pelvic: External genitalia:  no lesions              Urethra:  normal appearing urethra with no masses, tenderness or lesions              Bartholins and Skenes: normal                 Vagina: normal appearing vagina with normal color  and discharge, no lesions              Cervix: no lesions              Pap taken: Yes.   Bimanual Exam:  Uterus:  normal size, contour, position, consistency, mobility, non-tender              Adnexa: no mass, fullness, tenderness                Chaperone was present for exam.  Assessment:   Well woman visit with normal exam. Breakthrough bleeding with Nexplanon.  Hx irregular menses prior to Nexplanon.  Hx prior positive HR HPV.   Plan: Mammogram screening age 58.  Recommended self breast awareness. Pap today. Guidelines for Calcium, Vitamin D, regular exercise program including cardiovascular and weight bearing exercise. We discussed NuvaRing, Ortho Evra, and progesterone IUDs.  She may consider a NuvaRing if her irregular bleeding persists.  STD screening.  Follow up annually and prn.   After visit summary provided.

## 2018-08-11 LAB — HEP, RPR, HIV PANEL
HEP B S AG: NEGATIVE
HIV SCREEN 4TH GENERATION: NONREACTIVE
RPR: NONREACTIVE

## 2018-08-11 LAB — CYTOLOGY - PAP
Chlamydia: NEGATIVE
DIAGNOSIS: NEGATIVE
Neisseria Gonorrhea: NEGATIVE
Trichomonas: NEGATIVE

## 2018-08-11 LAB — HEPATITIS C ANTIBODY: Hep C Virus Ab: <0.1 s/co ratio (ref 0.0–0.9)

## 2018-11-21 ENCOUNTER — Other Ambulatory Visit: Payer: Self-pay | Admitting: *Deleted

## 2018-11-21 DIAGNOSIS — J309 Allergic rhinitis, unspecified: Secondary | ICD-10-CM

## 2018-11-21 MED ORDER — FLUTICASONE PROPIONATE 50 MCG/ACT NA SUSP: 1.0000 | Freq: Every day | NASAL | 2 refills | Status: DC

## 2018-11-21 MED ORDER — MONTELUKAST SODIUM 10 MG PO TABS: 10.0000 mg | ORAL_TABLET | Freq: Every day | ORAL | 0 refills | Status: DC

## 2018-11-21 MED FILL — MONTELUKAST SOD 10 MG TAB: 10 | 90 days supply | Qty: 90 | Fill #0

## 2018-11-21 MED FILL — FLUTICASONE PROP 50 MCG SPR: 50 | 60 days supply | Qty: 16 | Fill #0

## 2018-12-15 ENCOUNTER — Encounter: Payer: Self-pay | Admitting: Adult Health

## 2018-12-15 ENCOUNTER — Ambulatory Visit (INDEPENDENT_AMBULATORY_CARE_PROVIDER_SITE_OTHER): Payer: No Typology Code available for payment source | Admitting: Adult Health

## 2018-12-15 VITALS — BP 104/60 | Temp 99.3°F | Wt 147.0 lb

## 2018-12-15 DIAGNOSIS — R6889 Other general symptoms and signs: Secondary | ICD-10-CM

## 2018-12-15 LAB — POC INFLUENZA A&B (BINAX/QUICKVUE)
INFLUENZA A, POC: NEGATIVE
INFLUENZA B, POC: POSITIVE — AB

## 2018-12-15 MED ORDER — OSELTAMIVIR PHOSPHATE 75 MG PO CAPS: 75.0000 mg | ORAL_CAPSULE | Freq: Two times a day (BID) | ORAL | 0 refills | Status: AC

## 2018-12-15 NOTE — Progress Notes (Signed)
Subjective:    Patient ID: Angelica Norman, female    DOB: 1991-06-14, 28 y.o.   MRN: [id number]  Influenza  This is a new problem. The current episode started yesterday. Associated symptoms include chills, congestion, coughing, fatigue, a fever, headaches and swollen glands. Pertinent negatives include no sore throat. Treatments tried: Dayquil    She did receive flu vaccination    Review of Systems  Constitutional: Positive for chills, fatigue and fever.  HENT: Positive for congestion and sinus pressure. Negative for postnasal drip, rhinorrhea, sore throat and trouble swallowing.   Respiratory: Positive for cough. Negative for chest tightness, shortness of breath and wheezing.   Cardiovascular: Negative.   Gastrointestinal: Negative.   Neurological: Positive for headaches.   Past Medical History:  Diagnosis Date  . Abnormal Pap smear of cervix 07/2015   Neg/Pos HR HPV--in [place]--no colpo/no treatment  . Allergy   . Eczema   . Thyroid nodule 07/2018    Social History   Socioeconomic History  . Marital status: Single    Spouse name: Not on file  . Number of children: Not on file  . Years of education: Not on file  . Highest education level: Not on file  Occupational History  . Not on file  Social Needs  . Financial resource strain: Not on file  . Food insecurity:    Worry: Not on file    Inability: Not on file  . Transportation needs:    Medical: Not on file    Non-medical: Not on file  Tobacco Use  . Smoking status: Never Smoker  . Smokeless tobacco: Never Used  Substance and Sexual Activity  . Alcohol use: Yes    Alcohol/week: 2.0 standard drinks    Types: 2 Cans of beer per week  . Drug use: No  . Sexual activity: Yes    Partners: Male    Birth control/protection: Implant    Comment: Nexplanon inserted 10-26-14/Lt.arm  Lifestyle  . Physical activity:    Days per week: Not on file    Minutes per session: Not on file  . Stress: Not on  file  Relationships  . Social connections:    Talks on phone: Not on file    Gets together: Not on file    Attends religious service: Not on file    Active member of club or organization: Not on file    Attends meetings of clubs or organizations: Not on file    Relationship status: Not on file  . Intimate partner violence:    Fear of current or ex partner: Not on file    Emotionally abused: Not on file    Physically abused: Not on file    Forced sexual activity: Not on file  Other Topics Concern  . Not on file  Social History Narrative  . Not on file    History reviewed. No pertinent surgical history.  Family History  Problem Relation Age of Onset  . Arthritis Mother   . Hyperlipidemia Mother   . Diabetes Father   . Cancer Father        prostate  . Thyroid disease Father        hypothyroid  . Heart disease Maternal Grandmother   . Hyperlipidemia Maternal Grandmother   . Stroke Maternal Grandmother   . Stroke Paternal Grandmother   . Hypertension Paternal Grandmother   . Cancer Paternal Grandfather 14       Dec colon cancer    No Known Allergies  Current Outpatient Medications on File Prior to Visit  Medication Sig Dispense Refill  . cetirizine (ZYRTEC) 10 MG tablet Take 10 mg by mouth daily.    Marland Kitchen etonogestrel (NEXPLANON) 68 MG IMPL implant 1 each by Subdermal route once.    . fluticasone (FLONASE) 50 MCG/ACT nasal spray Place 1 spray into both nostrils daily. 16 g 2  . montelukast (SINGULAIR) 10 MG tablet Take 1 tablet (10 mg total) by mouth at bedtime. 90 tablet 0  . triamcinolone cream (KENALOG) 0.1 % Apply 1 application topically 2 (two) times daily. (Patient taking differently: Apply 1 application topically as needed. ) 45 g 2   No current facility-administered medications on file prior to visit.     BP 104/60   Temp 99.3 F (37.4 C)   Wt 147 lb (66.7 kg)   BMI 24.84 kg/m       Objective:   Physical Exam Vitals signs reviewed.  Constitutional:       Appearance: Normal appearance. She is normal weight. She is ill-appearing and diaphoretic.  Neck:     Musculoskeletal: Normal range of motion and neck supple.  Cardiovascular:     Rate and Rhythm: Normal rate and regular rhythm.     Pulses: Normal pulses.     Heart sounds: Normal heart sounds.  Pulmonary:     Effort: Pulmonary effort is normal.     Breath sounds: Normal breath sounds.  Skin:    General: Skin is warm.     Capillary Refill: Capillary refill takes less than 2 seconds.  Neurological:     General: No focal deficit present.     Mental Status: She is alert and oriented to person, place, and time.  Psychiatric:        Mood and Affect: Mood normal.        Behavior: Behavior normal.        Thought Content: Thought content normal.        Judgment: Judgment normal.       Assessment & Plan:  1. Flu-like symptoms - Stay hydrated, rest motrin and tylenol  - POC Influenza A&B(BINAX/QUICKVUE) + influenza B - oseltamivir (TAMIFLU) 75 MG capsule; Take 1 capsule (75 mg total) by mouth 2 (two) times daily for 5 days.  Dispense: 10 capsule; Refill: 0   Shirline Frees, NP

## 2019-03-21 ENCOUNTER — Other Ambulatory Visit: Payer: Self-pay | Admitting: Family Medicine

## 2019-03-21 DIAGNOSIS — J309 Allergic rhinitis, unspecified: Secondary | ICD-10-CM

## 2019-03-21 MED FILL — MONTELUKAST SOD 10 MG TAB: 10 | 90 days supply | Qty: 90 | Fill #0

## 2019-07-17 ENCOUNTER — Encounter: Payer: Self-pay | Admitting: Family Medicine

## 2019-07-17 ENCOUNTER — Ambulatory Visit (INDEPENDENT_AMBULATORY_CARE_PROVIDER_SITE_OTHER): Payer: No Typology Code available for payment source | Admitting: Family Medicine

## 2019-07-17 ENCOUNTER — Other Ambulatory Visit: Payer: Self-pay

## 2019-07-17 VITALS — BP 110/66 | HR 69 | Temp 98.0°F | Resp 12 | Ht 64.5 in | Wt 142.0 lb

## 2019-07-17 DIAGNOSIS — Z13228 Encounter for screening for other metabolic disorders: Secondary | ICD-10-CM | POA: Diagnosis not present

## 2019-07-17 DIAGNOSIS — Z130 Encounter for screening for diseases of the blood and blood-forming organs and certain disorders involving the immune mechanism: Secondary | ICD-10-CM

## 2019-07-17 DIAGNOSIS — L309 Dermatitis, unspecified: Secondary | ICD-10-CM

## 2019-07-17 DIAGNOSIS — Z1329 Encounter for screening for other suspected endocrine disorder: Secondary | ICD-10-CM

## 2019-07-17 DIAGNOSIS — J309 Allergic rhinitis, unspecified: Secondary | ICD-10-CM

## 2019-07-17 DIAGNOSIS — Z13220 Encounter for screening for lipoid disorders: Secondary | ICD-10-CM | POA: Diagnosis not present

## 2019-07-17 DIAGNOSIS — Z0000 Encounter for general adult medical examination without abnormal findings: Secondary | ICD-10-CM

## 2019-07-17 DIAGNOSIS — E041 Nontoxic single thyroid nodule: Secondary | ICD-10-CM | POA: Diagnosis not present

## 2019-07-17 DIAGNOSIS — Z008 Encounter for other general examination: Secondary | ICD-10-CM

## 2019-07-17 DIAGNOSIS — Z0189 Encounter for other specified special examinations: Secondary | ICD-10-CM

## 2019-07-17 LAB — LIPID PANEL
Cholesterol: 168 mg/dL (ref 0–200)
HDL: 59.1 mg/dL (ref >39.00)
LDL Cholesterol: 97 mg/dL (ref 0–99)
NonHDL: 108.52
Total CHOL/HDL Ratio: 3
Triglycerides: 57 mg/dL (ref 0.0–149.0)
VLDL: 11.4 mg/dL (ref 0.0–40.0)

## 2019-07-17 LAB — GLUCOSE, RANDOM: Glucose, Bld: 84 mg/dL (ref 70–99)

## 2019-07-17 LAB — TSH: TSH: 1.49 u[IU]/mL (ref 0.35–4.50)

## 2019-07-17 MED ORDER — TRIAMCINOLONE ACETONIDE 0.1 % EX CREA: 1.0000 "application " | TOPICAL_CREAM | Freq: Every day | CUTANEOUS | 2 refills | Status: DC | PRN

## 2019-07-17 MED ORDER — MONTELUKAST SODIUM 10 MG PO TABS: 10.0000 mg | ORAL_TABLET | Freq: Every day | ORAL | 3 refills | Status: DC

## 2019-07-17 MED FILL — MONTELUKAST SOD 10 MG TAB: 10 | 90 days supply | Qty: 90 | Fill #0

## 2019-07-17 MED FILL — TRIAMCINOLONE 0.1% CREAM: 0.1 | 21 days supply | Qty: 45 | Fill #0

## 2019-07-17 NOTE — Assessment & Plan Note (Signed)
Benign characteristics. We will continue monitoring for changes.

## 2019-07-17 NOTE — Assessment & Plan Note (Signed)
Currently she has some rash. Continue topical steroid, triamcinolone 01% daily as needed. We discussed some side effects of topical steroids, she already has hypopigmented macular area where she has been applied medication. We can consider Elidel,she would let me know.

## 2019-07-17 NOTE — Patient Instructions (Addendum)
Today you have you routine preventive visit.  At least 150 minutes of moderate exercise per week, daily brisk walking for 15-30 min is a good exercise option. Healthy diet low in saturated (animal) fats and sweets and consisting of fresh fruits and vegetables, lean meats such as fish and white chicken and whole grains.  These are some of recommendations for screening depending of age and risk factors: A few things to remember from today's visit:   Thyroid nodule - Plan: TSH  Routine general medical examination at a health care facility  Screening for lipoid disorders - Plan: Lipid panel  Screening for endocrine, metabolic and immunity disorder - Plan: Glucose, random  Encounter for biometric screening - Plan: Glucose, random  Allergic rhinitis, unspecified seasonality, unspecified trigger - Plan: montelukast (SINGULAIR) 10 MG tablet  Eczema, unspecified type - Plan: triamcinolone cream (KENALOG) 0.1 %   Please be sure medication list is accurate. If a new problem present, please set up appointment sooner than planned today.     - Vaccines:  Tdap vaccine every 10 years.  Shingles vaccine recommended at age 76, could be given after 26 years of age but not sure about insurance coverage.   Pneumonia vaccines:  Prevnar 13 at 65 and Pneumovax at 70. Sometimes Pneumovax is giving earlier if history of smoking, lung disease,diabetes,kidney disease among some.    Screening for diabetes at age 65 and every 3 years.  Cervical cancer prevention:  Pap smear starts at 26 years of age and continues periodically until 26 years old in low risk women. Pap smear every 3 years between 46 and 66 years old. Pap smear every 3-5 years between women 85 and older if pap smear negative and HPV screening negative.   -Breast cancer: Mammogram: There is disagreement between experts about when to start screening in low risk asymptomatic female but recent recommendations are to start screening at 77  and not later than 26 years old , every 1-2 years and after 26 yo q 2 years. Screening is recommended until 26 years old but some women can continue screening depending of healthy issues.   Colon cancer screening: starts at 26 years old until 26 years old.  Cholesterol disorder screening at age 24 and every 3 years.  Also recommended:  1. Dental visit- Brush and floss your teeth twice daily; visit your dentist twice a year. 2. Eye doctor- Get an eye exam at least every 2 years. 3. Helmet use- Always wear a helmet when riding a bicycle, motorcycle, rollerblading or skateboarding. 4. Safe sex- If you may be exposed to sexually transmitted infections, use a condom. 5. Seat belts- Seat belts can save your live; always wear one. 6. Smoke/Carbon Monoxide detectors- These detectors need to be installed on the appropriate level of your home. Replace batteries at least once a year. 7. Skin cancer- When out in the sun please cover up and use sunscreen 15 SPF or higher. 8. Violence- If anyone is threatening or hurting you, please tell your healthcare provider.  9. Drink alcohol in moderation- Limit alcohol intake to one drink or less per day. Never drink and drive.

## 2019-07-17 NOTE — Assessment & Plan Note (Addendum)
Problem is still well controlled with Singulair 10 mg . Continue Flonase nasal spray and Zyrtec 10 mg daily as needed.

## 2019-07-17 NOTE — Progress Notes (Addendum)
HPI:   [name] is a 28 y.o. female, who is here today for her routine physical.  Last CPE: 07/13/2018.  Regular exercise 3 or more time per week: Yes,she does wt lifting,yoga,and walking. Following a healthy diet: Yes. She lives with her boyfriend.  Chronic medical problems: Irregular menses, allergy rhinitis, and eczema.  Pap smear: 08/10/2018.  She follows with her gynecology regularly.  There is no immunization history on file for this patient.  She needs biometric labs done today in case they are requested for insurance purposes. She has no concerns today.  In 07/2018 she has thyroid US: Bilateral thyroid cysts and nodules which are either sonographically benign or do not meet criteria for further evaluation.  Of note, a slightly exophytic TI-RADS category 2 spongiform nodule in the medial aspect of the right upper gland could potentially be palpable on physical exam given its superficial location. The thyroid gland itself is normal in size.  Requesting refills on some of her meds.  Allergic rhinitis, she is on Singulair 10 mg. She is also on Zyrtec 10 mg daily prn and Flonase nasal spray.  Eczema,triamcinolone helps. She has some pruritic rash on UE's.   Review of Systems  Constitutional: Negative for appetite change, fatigue and fever.  HENT: Negative for dental problem, hearing loss, mouth sores, sore throat, trouble swallowing and voice change.   Eyes: Negative for redness and visual disturbance.  Respiratory: Negative for cough, shortness of breath and wheezing.   Cardiovascular: Negative for chest pain, palpitations and leg swelling.  Gastrointestinal: Negative for abdominal pain, nausea and vomiting.       No changes in bowel habits.  Endocrine: Negative for cold intolerance, heat intolerance, polydipsia, polyphagia and polyuria.  Genitourinary: Positive for vaginal bleeding (intermittent for a year or so,attributed to Nexplanon.).  Negative for decreased urine volume, hematuria and vaginal discharge.  Musculoskeletal: Positive for arthralgias and back pain. Negative for gait problem.       Seldom joint pain,very mild and exacerbated by working up,depending of intensity.  Skin: Negative for color change and rash.  Allergic/Immunologic: Positive for environmental allergies.  Neurological: Negative for syncope, weakness and headaches.  Hematological: Negative for adenopathy. Does not bruise/bleed easily.  Psychiatric/Behavioral: Negative for confusion and sleep disturbance. The patient is not nervous/anxious.   All other systems reviewed and are negative.   Current Outpatient Medications on File Prior to Visit  Medication Sig Dispense Refill  . cetirizine (ZYRTEC) 10 MG tablet Take 10 mg by mouth daily.    Marland Kitchen etonogestrel (NEXPLANON) 68 MG IMPL implant 1 each by Subdermal route once.    . fluticasone (FLONASE) 50 MCG/ACT nasal spray Place 1 spray into both nostrils daily. 16 g 2   No current facility-administered medications on file prior to visit.      Past Medical History:  Diagnosis Date  . Abnormal Pap smear of cervix 07/2015   Neg/Pos HR HPV--in [place]--no colpo/no treatment  . Allergy   . Eczema   . Thyroid nodule 07/2018    History reviewed. No pertinent surgical history.  No Known Allergies  Family History  Problem Relation Age of Onset  . Arthritis Mother   . Hyperlipidemia Mother   . Diabetes Father   . Cancer Father        prostate  . Thyroid disease Father        hypothyroid  . Heart disease Maternal Grandmother   . Hyperlipidemia Maternal Grandmother   . Stroke Maternal  Grandmother   . Stroke Paternal Grandmother   . Hypertension Paternal Grandmother   . Cancer Paternal Grandfather [phone]       Dec colon cancer    Social History   Socioeconomic History  . Marital status: Single    Spouse name: Not on file  . Number of children: Not on file  . Years of education: Not on file  .  Highest education level: Not on file  Occupational History  . Not on file  Social Needs  . Financial resource strain: Not on file  . Food insecurity    Worry: Not on file    Inability: Not on file  . Transportation needs    Medical: Not on file    Non-medical: Not on file  Tobacco Use  . Smoking status: Never Smoker  . Smokeless tobacco: Never Used  Substance and Sexual Activity  . Alcohol use: Yes    Alcohol/week: 2.0 standard drinks    Types: 2 Cans of beer per week  . Drug use: No  . Sexual activity: Yes    Partners: Male    Birth control/protection: Implant    Comment: Nexplanon inserted 10-26-14/Lt.arm  Lifestyle  . Physical activity    Days per week: Not on file    Minutes per session: Not on file  . Stress: Not on file  Relationships  . Social Musicianconnections    Talks on phone: Not on file    Gets together: Not on file    Attends religious service: Not on file    Active member of club or organization: Not on file    Attends meetings of clubs or organizations: Not on file    Relationship status: Not on file  Other Topics Concern  . Not on file  Social History Narrative  . Not on file     Vitals:   07/17/19 0838  BP: 110/66  Pulse: 69  Resp: 12  Temp: 98 F (36.7 C)  SpO2: 99%   Body mass index is 24 kg/m.   Wt Readings from Last 3 Encounters:  07/17/19 142 lb (64.4 kg)  12/15/18 147 lb (66.7 kg)  08/10/18 142 lb 3.2 oz (64.5 kg)   Physical Exam  Nursing note and vitals reviewed. Constitutional: She is oriented to person, place, and time. She appears well-developed and well-nourished. No distress.  HENT:  Head: Normocephalic and atraumatic.  Right Ear: Hearing, external ear and ear canal normal. Tympanic membrane is scarred. Tympanic membrane is not erythematous.  Left Ear: Hearing, tympanic membrane, external ear and ear canal normal.  Mouth/Throat: Uvula is midline, oropharynx is clear and moist and mucous membranes are normal.  Eyes: Pupils are  equal, round, and reactive to light. Conjunctivae and EOM are normal.  Neck: No tracheal deviation present. Thyromegaly present.  Cardiovascular: Normal rate and regular rhythm.  No murmur heard. Pulses:      Dorsalis pedis pulses are 2+ on the right side and 2+ on the left side.  Respiratory: Effort normal and breath sounds normal. No respiratory distress.  GI: Soft. She exhibits no mass. There is no hepatomegaly. There is no abdominal tenderness.  Genitourinary:    Genitourinary Comments: Deferred to gyn.   Musculoskeletal:        General: No edema.     Comments: No major deformity or signs of synovitis appreciated.  Lymphadenopathy:    She has no cervical adenopathy.       Right: No supraclavicular adenopathy present.  Left: No supraclavicular adenopathy present.  Neurological: She is alert and oriented to person, place, and time. She has normal strength. No cranial nerve deficit. Coordination and gait normal.  Reflex Scores:      Bicep reflexes are 2+ on the right side and 2+ on the left side.      Patellar reflexes are 2+ on the right side and 2+ on the left side. Skin: Skin is warm. Rash noted. No erythema.     Micropapular erythematous rash on anterior aspect of both elbows. Hypopigmented macular lesion surrounded by some erythema.  Psychiatric: She has a normal mood and affect. Her speech is normal.  Well groomed, good eye contact.    ASSESSMENT AND PLAN:  [name] was here today annual physical examination.  Orders Placed This Encounter  Procedures  . Lipid panel  . TSH  . Glucose, random    Lab Results  Component Value Date   TSH 1.49 07/17/2019   Lab Results  Component Value Date   CHOL 168 07/17/2019   HDL 59.10 07/17/2019   LDLCALC 97 07/17/2019   TRIG 57.0 07/17/2019   CHOLHDL 3 07/17/2019    Routine general medical examination at a health care facility We discussed the importance of regular physical activity and  healthy diet for prevention of chronic illness and/or complications. Preventive guidelines reviewed. Vaccination up to date. Continue following with gyn for her female preventive care and to discussed irregular vaginal bleeding. Next CPE in a year.  Screening for lipoid disorders -     Lipid panel  Screening for endocrine, metabolic and immunity disorder -     Glucose, random  Encounter for biometric screening -     Glucose, random  Allergic rhinitis Problem is still well controlled with Singulair 10 mg . Continue Flonase nasal spray and Zyrtec 10 mg daily as needed.  Eczema Currently she has some rash. Continue topical steroid, triamcinolone 01% daily as needed. We discussed some side effects of topical steroids, she already has hypopigmented macular area where she has been applied medication. We can consider Elidel,she would let me know.  Thyroid nodule Benign characteristics. We will continue monitoring for changes.    Return in 1 year (on 07/16/2020) for cpe.    Angelica Isabell G. SwazilandJordan, MD  [facility]. [place] office.

## 2019-08-01 ENCOUNTER — Encounter: Payer: Self-pay | Admitting: Obstetrics and Gynecology

## 2019-08-02 ENCOUNTER — Telehealth: Payer: Self-pay | Admitting: Obstetrics and Gynecology

## 2019-08-02 NOTE — Telephone Encounter (Signed)
Patient sent the following correspondence through [facility].  I have a scheduled yearly check up in October and I was wondering if we could also remove my nexplanon and exchange with skyla. I have had 2 periods per month for the last 5 months and it is not what I am looking for in contraceptive.     Looking forward to hearing about options    Steps Gillen

## 2019-08-02 NOTE — Telephone Encounter (Signed)
Message left to return call to Triage Nurse at [phone].    

## 2019-08-23 ENCOUNTER — Ambulatory Visit: Payer: No Typology Code available for payment source | Admitting: Obstetrics and Gynecology

## 2019-09-07 NOTE — Telephone Encounter (Signed)
Spoke with patient. Has nexplanon for contraceptive, has been bleeding twice each month for the past 5 months. Describes one as a normal menses and spotting at other times. Denies any other GYN symptoms or pain. Would like to discuss other contraceptive options. Is scheduled for AEX 09/26/19. Advised patient nexplaon removal and IUD insertion would not be able to be done same day as AEX. Recommended discussing with Dr. Quincy Simmonds at [facility], can then precert and schedule depending on what contraceptive you choose.  Offered to move AEX to earlier date, patient declined. Patient verbalizes understanding and is agreeable.   Routing to provider for final review. Patient is agreeable to disposition. Will close encounter.

## 2019-09-21 NOTE — Progress Notes (Signed)
28 y.o. G0P0000 Single Caucasian female here for annual exam.  Patient having 2 cycles per month with Nexplanon.   Interested in a progesterone IUD.   Engaged to be married in [place].  Declines blood work and STD screening.  PCP:  Betty [place], MD   Patient's last menstrual period was 09/23/2019 (exact date).           Sexually active: Yes.    The current method of family planning is Nexplanon 10-20-17 in left arm    Exercising: Yes.    strength and power yoga. Smoker:  no  Health Maintenance: Pap: 08/10/18 normal.  08-02-17 Neg, 07/2016 Neg, 07/2015 Neg:Pos Hr HPV History of abnormal Pap:  Yes,  07/2015 Neg:Pos Hr HPV--no colposcopy, no treatment MMG:  n/a Colonoscopy:  n/a BMD:   n/a  Result  n/a TDaP: up to date through [facility]  Gardasil:   yes HIV:08-02-17 NR Hep C:08-02-17 Neg Screening Labs:  Declined.  Flu vaccine:  Completed.   reports that she has never smoked. She has never used smokeless tobacco. She reports current alcohol use of about 3.0 standard drinks of alcohol per week. She reports that she does not use drugs.  Past Medical History:  Diagnosis Date  . Abnormal Pap smear of cervix 07/2015   Neg/Pos HR HPV--in [place]--no colpo/no treatment  . Allergy   . Eczema   . Thyroid nodule 07/2018    History reviewed. No pertinent surgical history.  Current Outpatient Medications  Medication Sig Dispense Refill  . cetirizine (ZYRTEC) 10 MG tablet Take 10 mg by mouth daily.    Marland Kitchen etonogestrel (NEXPLANON) 68 MG IMPL implant 1 each by Subdermal route once.    . fluticasone (FLONASE) 50 MCG/ACT nasal spray Place 1 spray into both nostrils daily. 16 g 2  . montelukast (SINGULAIR) 10 MG tablet Take 1 tablet (10 mg total) by mouth at bedtime. 90 tablet 3  . triamcinolone cream (KENALOG) 0.1 % Apply 1 application topically daily as needed. 45 g 2   No current facility-administered medications for this visit.     Family History  Problem Relation Age of Onset  . Arthritis  Mother   . Hyperlipidemia Mother   . Diabetes Father   . Cancer Father        prostate  . Thyroid disease Father        hypothyroid  . Heart disease Maternal Grandmother   . Hyperlipidemia Maternal Grandmother   . Stroke Maternal Grandmother   . Stroke Paternal Grandmother   . Hypertension Paternal Grandmother   . Cancer Paternal Grandfather 51       Dec colon cancer    Review of Systems  All other systems reviewed and are negative.   Exam:   BP 112/72   Pulse 66   Temp 98.1 F (36.7 C) (Temporal)   Resp 14   Ht 5' 4.5" (1.638 m)   Wt 143 lb (64.9 kg)   LMP 09/23/2019 (Exact Date)   BMI 24.17 kg/m     General appearance: alert, cooperative and appears stated age Head: normocephalic, without obvious abnormality, atraumatic Neck: no adenopathy, supple, symmetrical, trachea midline and thyroid normal to inspection and palpation Lungs: clear to auscultation bilaterally Breasts: normal appearance, no masses or tenderness, No nipple retraction or dimpling, No nipple discharge or bleeding, No axillary adenopathy Heart: regular rate and rhythm Abdomen: soft, non-tender; no masses, no organomegaly Extremities: extremities normal, atraumatic, no cyanosis or edema.  Nexplanon present in her left arm.  Skin:  skin color, texture, turgor normal. No rashes or lesions Lymph nodes: cervical, supraclavicular, and axillary nodes normal. Neurologic: grossly normal  Pelvic: External genitalia:  no lesions              No abnormal inguinal nodes palpated.              Urethra:  normal appearing urethra with no masses, tenderness or lesions              Bartholins and Skenes: normal                 Vagina: normal appearing vagina with normal color and discharge, no lesions              Cervix: no lesions              Pap taken: No. Bimanual Exam:  Uterus:  normal size, contour, position, consistency, mobility, non-tender              Adnexa: no mass, fullness, tenderness             Chaperone was present for exam.  Assessment:   Well woman visit with normal exam. Nexplanon.  Breakthrough bleeding.  Hx positive HR HPV and normal pap in 2016.  FU paps normal.   Plan: Mammogram screening discussed. Self breast awareness reviewed. Cervical cancer screening in 2022. Guidelines for Calcium, Vitamin D, regular exercise program including cardiovascular and weight bearing exercise. Return for removal of Nexplanon and placement of Nash or Mirena.  Progesterone IUDs discussed in detail, risks and benefits. Will plan for paracervical block and Cytotec 200 mcg pv at hs the night before and the am of procedure.  Follow up annually and prn.   After visit summary provided.

## 2019-09-26 ENCOUNTER — Other Ambulatory Visit: Payer: Self-pay

## 2019-09-26 ENCOUNTER — Ambulatory Visit (INDEPENDENT_AMBULATORY_CARE_PROVIDER_SITE_OTHER): Payer: No Typology Code available for payment source | Admitting: Obstetrics and Gynecology

## 2019-09-26 ENCOUNTER — Encounter: Payer: Self-pay | Admitting: Obstetrics and Gynecology

## 2019-09-26 VITALS — BP 112/72 | HR 66 | Temp 98.1°F | Resp 14 | Ht 64.5 in | Wt 143.0 lb

## 2019-09-26 DIAGNOSIS — Z01419 Encounter for gynecological examination (general) (routine) without abnormal findings: Secondary | ICD-10-CM

## 2019-09-26 DIAGNOSIS — Z3009 Encounter for other general counseling and advice on contraception: Secondary | ICD-10-CM | POA: Diagnosis not present

## 2019-09-26 NOTE — Patient Instructions (Addendum)
EXERCISE AND DIET:  We recommended that you start or continue a regular exercise program for good health. Regular exercise means any activity that makes your heart beat faster and makes you sweat.  We recommend exercising at least 30 minutes per day at least 3 days a week, preferably 4 or 5.  We also recommend a diet low in fat and sugar.  Inactivity, poor dietary choices and obesity can cause diabetes, heart attack, stroke, and kidney damage, among others.   ° °ALCOHOL AND SMOKING:  Women should limit their alcohol intake to no more than 7 drinks/beers/glasses of wine (combined, not each!) per week. Moderation of alcohol intake to this level decreases your risk of breast cancer and liver damage. And of course, no recreational drugs are part of a healthy lifestyle.  And absolutely no smoking or even second hand smoke. Most people know smoking can cause heart and lung diseases, but did you know it also contributes to weakening of your bones? Aging of your skin?  Yellowing of your teeth and nails? ° °CALCIUM AND VITAMIN D:  Adequate intake of calcium and Vitamin D are recommended.  The recommendations for exact amounts of these supplements seem to change often, but generally speaking 600 mg of calcium (either carbonate or citrate) and 800 units of Vitamin D per day seems prudent. Certain women may benefit from higher intake of Vitamin D.  If you are among these women, your doctor will have told you during your visit.   ° °PAP SMEARS:  Pap smears, to check for cervical cancer or precancers,  have traditionally been done yearly, although recent scientific advances have shown that most women can have pap smears less often.  However, every woman still should have a physical exam from her gynecologist every year. It will include a breast check, inspection of the vulva and vagina to check for abnormal growths or skin changes, a visual exam of the cervix, and then an exam to evaluate the size and shape of the uterus and  ovaries.  And after 26 years of age, a rectal exam is indicated to check for rectal cancers. We will also provide age appropriate advice regarding health maintenance, like when you should have certain vaccines, screening for sexually transmitted diseases, bone density testing, colonoscopy, mammograms, etc.  ° °MAMMOGRAMS:  All women over 40 years old should have a yearly mammogram. Many facilities now offer a "3D" mammogram, which may cost around $50 extra out of pocket. If possible,  we recommend you accept the option to have the 3D mammogram performed.  It both reduces the number of women who will be called back for extra views which then turn out to be normal, and it is better than the routine mammogram at detecting truly abnormal areas.   ° °COLONOSCOPY:  Colonoscopy to screen for colon cancer is recommended for all women at age 50.  We know, you hate the idea of the prep.  We agree, BUT, having colon cancer and not knowing it is worse!!  Colon cancer so often starts as a polyp that can be seen and removed at colonscopy, which can quite literally save your life!  And if your first colonoscopy is normal and you have no family history of colon cancer, most women don't have to have it again for 10 years.  Once every ten years, you can do something that may end up saving your life, right?  We will be happy to help you get it scheduled when you are ready.    Be sure to check your insurance coverage so you understand how much it will cost.  It may be covered as a preventative service at no cost, but you should check your particular policy.       Intrauterine Device Information An intrauterine device (IUD) is a medical device that is inserted in the uterus to prevent pregnancy. It is a small, T-shaped device that has one or two nylon strings hanging down from it. The strings hang out of the lower part of the uterus (cervix) to allow for future IUD removal. There are two types of IUDs available:  Hormone IUD. This  type of IUD is made of plastic and contains the hormone progestin (synthetic progesterone). A hormone IUD may last 3-5 years.  Copper IUD. This type of IUD has copper wire wrapped around it. A copper IUD may last up to 10 years. How is an IUD inserted? An IUD is inserted through the vagina and placed into the uterus with a minor medical procedure. The exact procedure for IUD insertion may vary among health care providers and hospitals. How does an IUD work? Synthetic progesterone in a hormonal IUD prevents pregnancy by:  Thickening cervical mucus to prevent sperm from entering the uterus.  Thinning the uterine lining to prevent a fertilized egg from being implanted there. Copper in a copper IUD prevents pregnancy by making the uterus and fallopian tubes produce a fluid that kills sperm. What are the advantages of an IUD? Advantages of either type of IUD  It is highly effective in preventing pregnancy.  It is reversible. You can become pregnant shortly after the IUD is removed.  It is low-maintenance and can stay in place for a long time.  There are no estrogen-related side effects.  It can be used when breastfeeding.  It is not associated with weight gain.  It can be inserted right after childbirth, an abortion, or a miscarriage. Advantages of a hormone IUD  If it is inserted within 7 days of your period starting, it works right after it is inserted. If the hormone IUD is inserted at any other time in your cycle, you will need to use a backup method of birth control for 7 days after insertion.  It can make menstrual periods lighter.  It can reduce menstrual cramping.  It can be used for 3-5 years. Advantages of a copper IUD  It works right after it is inserted.  It can be used as a form of emergency birth control if it is inserted within 5 days after having unprotected sex.  It does not interfere with your body's natural hormones.  It can be used for 10 years. What are  the disadvantages of an IUD?  An IUD may cause irregular menstrual bleeding for a period of time after insertion.  You may have pain during insertion and have cramping and vaginal bleeding after insertion.  An IUD may cut the uterus (uterine perforation) when it is inserted. This is rare.  An IUD may cause pelvic inflammatory disease (PID), which is an infection in the uterus and fallopian tubes. This is rare, and it usually happens during the first 20 days after the IUD is inserted.  A copper IUD can make your menstrual flow heavier and more painful. How is an IUD removed?  You will lie on your back with your knees bent and your feet in footrests (stirrups).  A device will be inserted into your vagina to spread apart the vaginal walls (speculum). This will allow  your health care provider to see the strings attached to the IUD.  Your health care provider will use a small instrument (forceps) to grasp the IUD strings and pull firmly until the IUD is removed. You may have some discomfort when the IUD is removed. Your health care provider may recommend taking over-the-counter pain relievers, such as ibuprofen, before the procedure. You may also have minor spotting for a few days after the procedure. The exact procedure for IUD removal may vary among health care providers and hospitals. Is the IUD right for me? Your health care provider will make sure you are a good candidate for an IUD and will discuss the advantages, disadvantages, and possible side effects with you. Summary  An intrauterine device (IUD) is a medical device that is inserted in the uterus to prevent pregnancy. It is a small, T-shaped device that has one or two nylon strings hanging down from it.  A hormone IUD contains the hormone progestin (synthetic progesterone). A copper IUD has copper wire wrapped around it.  Synthetic progesterone in a hormone IUD prevents pregnancy by thickening cervical mucus and thinning the walls  of the uterus. Copper in a copper IUD prevents pregnancy by making the uterus and fallopian tubes produce a fluid that kills sperm.  A hormone IUD can be left in place for 3-5 years. A copper IUD can be left in place for up to 10 years.  An IUD is inserted and removed by a health care provider. You may feel some pain during insertion and removal. Your health care provider may recommend taking over-the-counter pain medicine, such as ibuprofen, before an IUD procedure. This information is not intended to replace advice given to you by your health care provider. Make sure you discuss any questions you have with your health care provider. Document Released: 10/27/2004 Document Revised: 11/05/2017 Document Reviewed: 12/22/2016 Elsevier Patient Education  2020 [employer].

## 2019-09-27 ENCOUNTER — Telehealth: Payer: Self-pay | Admitting: Obstetrics and Gynecology

## 2019-09-27 NOTE — Telephone Encounter (Signed)
Spoke with patient and conveyed benefits. Patient understands/agreeable with the benefits. Patient prefers a Monday/Tuesday appointment with Dr. Quincy Simmonds. Please call patient for scheduling.

## 2019-09-27 NOTE — Telephone Encounter (Signed)
Call placed to convey benefits for nexplanon removal and iud insertion. °

## 2019-09-27 NOTE — Telephone Encounter (Signed)
Message left to return call to Triage Nurse at [phone].    

## 2019-10-03 MED ORDER — MISOPROSTOL 200 MCG PO TABS: ORAL_TABLET | ORAL | 0 refills | Status: DC

## 2019-10-03 MED FILL — miSOPROStol 200 MCG TABS: 200 | 2 days supply | Qty: 2 | Fill #0

## 2019-10-03 NOTE — Telephone Encounter (Signed)
Spoke with patient. Request to schedule nexplanon removal and IUD insertion with Dr. Quincy Simmonds. Scheduled for 11/9 at 10am with Dr. Quincy Simmonds. Patient declined earlier appts.   Reviewed 09/26/19 OV notes: Will plan for paracervical block and Cytotec 200 mcg pv at hs the night before and the am of procedure.   Rx for cytotec to verified pharmacy, patient read back instructions. Advised to take Motrin 800 mg with food and water one hour before procedure.   Routing to provider for final review. Patient is agreeable to disposition. Will close encounter.  Cc: Lerry Liner, Magdalene Patricia

## 2019-10-11 MED FILL — miSOPROStol 200 MCG TABS: 200 | 2 days supply | Qty: 2 | Fill #0

## 2019-10-12 ENCOUNTER — Other Ambulatory Visit: Payer: Self-pay

## 2019-10-12 NOTE — Progress Notes (Signed)
GYNECOLOGY  VISIT   HPI: 28 y.o.   Single  Caucasian  female   G0P0000 with Patient's last menstrual period was 09/23/2019 (exact date).   here for Nexplanon removal and insertion of IUD.   She will have a Mirena IUD or [place].   Use Cytotec tablets vaginally last hs and this am.  UPT: Neg  GYNECOLOGIC HISTORY: Patient's last menstrual period was 09/23/2019 (exact date). Contraception: Nexplanon 10-20-17 Lt.arm Menopausal hormone therapy:  n/a Last mammogram:  n/a Last pap smear: 08-10-18 Neg,08-02-17 Neg, 07/2016 Neg, 07/2015 Neg:Pos Hr HPV         OB History    Gravida  0   Para  0   Term  0   Preterm  0   AB  0   Living  0     SAB  0   TAB  0   Ectopic  0   Multiple  0   Live Births  0              Patient Active Problem List   Diagnosis Date Noted  . Thyroid nodule 07/17/2019  . Eczema 07/19/2017  . Allergic rhinitis 07/19/2017    Past Medical History:  Diagnosis Date  . Abnormal Pap smear of cervix 07/2015   Neg/Pos HR HPV--in [place]--no colpo/no treatment  . Allergy   . Eczema   . Thyroid nodule 07/2018    History reviewed. No pertinent surgical history.  Current Outpatient Medications  Medication Sig Dispense Refill  . cetirizine (ZYRTEC) 10 MG tablet Take 10 mg by mouth daily.    Marland Kitchen etonogestrel (NEXPLANON) 68 MG IMPL implant 1 each by Subdermal route once.    . fluticasone (FLONASE) 50 MCG/ACT nasal spray Place 1 spray into both nostrils daily. 16 g 2  . misoprostol (CYTOTEC) 200 MCG tablet Place one tablet vaginally night before procedure and place one tablet vaginally morning of procedure. 2 tablet 0  . montelukast (SINGULAIR) 10 MG tablet Take 1 tablet (10 mg total) by mouth at bedtime. 90 tablet 3  . triamcinolone cream (KENALOG) 0.1 % Apply 1 application topically daily as needed. 45 g 2   No current facility-administered medications for this visit.      ALLERGIES: Patient has no known allergies.  Family History  Problem  Relation Age of Onset  . Arthritis Mother   . Hyperlipidemia Mother   . Diabetes Father   . Cancer Father        prostate  . Thyroid disease Father        hypothyroid  . Heart disease Maternal Grandmother   . Hyperlipidemia Maternal Grandmother   . Stroke Maternal Grandmother   . Stroke Paternal Grandmother   . Hypertension Paternal Grandmother   . Cancer Paternal Grandfather 69       Dec colon cancer    Social History   Socioeconomic History  . Marital status: Single    Spouse name: Not on file  . Number of children: Not on file  . Years of education: Not on file  . Highest education level: Not on file  Occupational History  . Not on file  Social Needs  . Financial resource strain: Not on file  . Food insecurity    Worry: Not on file    Inability: Not on file  . Transportation needs    Medical: Not on file    Non-medical: Not on file  Tobacco Use  . Smoking status: Never Smoker  . Smokeless tobacco: Never  Used  Substance and Sexual Activity  . Alcohol use: Yes    Alcohol/week: 3.0 standard drinks    Types: 3 Cans of beer per week  . Drug use: No  . Sexual activity: Yes    Partners: Male    Birth control/protection: Implant    Comment: Nexplanon inserted 10-26-14/Lt.arm  Lifestyle  . Physical activity    Days per week: Not on file    Minutes per session: Not on file  . Stress: Not on file  Relationships  . Social Musicianconnections    Talks on phone: Not on file    Gets together: Not on file    Attends religious service: Not on file    Active member of club or organization: Not on file    Attends meetings of clubs or organizations: Not on file    Relationship status: Not on file  . Intimate partner violence    Fear of current or ex partner: Not on file    Emotionally abused: Not on file    Physically abused: Not on file    Forced sexual activity: Not on file  Other Topics Concern  . Not on file  Social History Narrative  . Not on file    Review of  Systems  All other systems reviewed and are negative.   PHYSICAL EXAMINATION:    BP 120/72   Pulse 76   Temp 97.6 F (36.4 C) (Temporal)   Ht 5' 4.5" (1.638 m)   Wt 146 lb 9.6 oz (66.5 kg)   LMP 09/23/2019 (Exact Date)   BMI 24.78 kg/m     General appearance: alert, cooperative and appears stated age   Pelvic: External genitalia:  no lesions              Urethra:  normal appearing urethra with no masses, tenderness or lesions              Bartholins and Skenes: normal                 Vagina: normal appearing vagina with normal color and discharge, no lesions              Cervix: no lesions                Bimanual Exam:  Uterus:  normal size, contour, position, consistency, mobility, non-tender              Adnexa: no mass, fullness, tenderness           Nexplanon removal. Consent for procedure.  Sterile prep with betadine.  Local 1% lidocaine lot [id number], expiration April 2022.  Incision over the tip of the Nexplanon. Hemostat used to remove the Nexplanon, intact, shown to patient, and discarded.  Steri strips placed after betadine cleansed from the skin.  Sterile Gauze wrap placed.  Minimal ELB. No complications.  Mirena IUD insertion - lot number [id number], expiration December 2022.  Consent for procedure. Paracervical block with 10 cc 1% lidocaine lot [id number], expiration April 2022.  Tenaculum to anterior cervical lip.  Uterus sounded to 7.5 cm. Mirena placed without difficulty.  Strings trimmed and shown to patient.  Repeat bimanual exam, no change.  Minimal EBL.  No complications.   Chaperone was present for exam.  ASSESSMENT  Nexplanon removal.  Mirena IUD placement.   PLAN  Post procedure instructions to patient.  IUD card to patient.  Back up protection for one week.  Fu in 4 weeks.   An After Visit  Summary was printed and given to the patient.

## 2019-10-16 ENCOUNTER — Other Ambulatory Visit: Payer: Self-pay

## 2019-10-16 ENCOUNTER — Ambulatory Visit (INDEPENDENT_AMBULATORY_CARE_PROVIDER_SITE_OTHER): Payer: No Typology Code available for payment source | Admitting: Obstetrics and Gynecology

## 2019-10-16 ENCOUNTER — Encounter: Payer: Self-pay | Admitting: Obstetrics and Gynecology

## 2019-10-16 VITALS — BP 120/72 | HR 76 | Temp 97.6°F | Ht 64.5 in | Wt 146.6 lb

## 2019-10-16 DIAGNOSIS — Z3046 Encounter for surveillance of implantable subdermal contraceptive: Secondary | ICD-10-CM

## 2019-10-16 DIAGNOSIS — Z30430 Encounter for insertion of intrauterine contraceptive device: Secondary | ICD-10-CM | POA: Diagnosis not present

## 2019-10-16 DIAGNOSIS — Z01812 Encounter for preprocedural laboratory examination: Secondary | ICD-10-CM

## 2019-10-16 DIAGNOSIS — Z3009 Encounter for other general counseling and advice on contraception: Secondary | ICD-10-CM

## 2019-10-16 LAB — POCT URINE PREGNANCY: Preg Test, Ur: NEGATIVE

## 2019-10-16 NOTE — Patient Instructions (Signed)
Intrauterine Device Insertion, Care After  This sheet gives you information about how to care for yourself after your procedure. Your health care provider may also give you more specific instructions. If you have problems or questions, contact your health care provider. What can I expect after the procedure? After the procedure, it is common to have:  Cramps and pain in the abdomen.  Light bleeding (spotting) or heavier bleeding that is like your menstrual period. This may last for up to a few days.  Lower back pain.  Dizziness.  Headaches.  Nausea. Follow these instructions at home:  Before resuming sexual activity, check to make sure that you can feel the IUD string(s). You should be able to feel the end of the string(s) below the opening of your cervix. If your IUD string is in place, you may resume sexual activity. ? If you had a hormonal IUD inserted more than 7 days after your most recent period started, you will need to use a backup method of birth control for 7 days after IUD insertion. Ask your health care provider whether this applies to you.  Continue to check that the IUD is still in place by feeling for the string(s) after every menstrual period, or once a month.  Take over-the-counter and prescription medicines only as told by your health care provider.  Do not drive or use heavy machinery while taking prescription pain medicine.  Keep all follow-up visits as told by your health care provider. This is important. Contact a health care provider if:  You have bleeding that is heavier or lasts longer than a normal menstrual cycle.  You have a fever.  You have cramps or abdominal pain that get worse or do not get better with medicine.  You develop abdominal pain that is new or is not in the same area of earlier cramping and pain.  You feel lightheaded or weak.  You have abnormal or bad-smelling discharge from your vagina.  You have pain during sexual activity.   You have any of the following problems with your IUD string(s): ? The string bothers or hurts you or your sexual partner. ? You cannot feel the string. ? The string has gotten longer.  You can feel the IUD in your vagina.  You think you may be pregnant, or you miss your menstrual period.  You think you may have an STI (sexually transmitted infection). Get help right away if:  You have flu-like symptoms.  You have a fever and chills.  You can feel that your IUD has slipped out of place. Summary  After the procedure, it is common to have cramps and pain in the abdomen. It is also common to have light bleeding (spotting) or heavier bleeding that is like your menstrual period.  Continue to check that the IUD is still in place by feeling for the string(s) after every menstrual period, or once a month.  Keep all follow-up visits as told by your health care provider. This is important.  Contact your health care provider if you have problems with your IUD string(s), such as the string getting longer or bothering you or your sexual partner. This information is not intended to replace advice given to you by your health care provider. Make sure you discuss any questions you have with your health care provider. Document Released: 07/22/2011 Document Revised: 11/05/2017 Document Reviewed: 10/14/2016 Elsevier Patient Education  2020 Else[employer] Inc[employer]  

## 2019-11-14 ENCOUNTER — Ambulatory Visit (INDEPENDENT_AMBULATORY_CARE_PROVIDER_SITE_OTHER): Payer: No Typology Code available for payment source | Admitting: Obstetrics and Gynecology

## 2019-11-14 ENCOUNTER — Encounter: Payer: Self-pay | Admitting: Obstetrics and Gynecology

## 2019-11-14 ENCOUNTER — Other Ambulatory Visit: Payer: Self-pay

## 2019-11-14 VITALS — BP 110/62 | HR 70 | Temp 97.4°F | Ht 64.5 in | Wt 144.4 lb

## 2019-11-14 DIAGNOSIS — Z30431 Encounter for routine checking of intrauterine contraceptive device: Secondary | ICD-10-CM

## 2019-11-14 NOTE — Progress Notes (Signed)
GYNECOLOGY  VISIT   HPI: 28 y.o.   Single  Caucasian  female   G0P0000 with No LMP recorded. (Menstrual status: IUD).   here for 4 week follow up after Mirena IUD insertion.   Having a smear of blood once the entire day, not even filling a panty liner. No pain.   She had a Nexplanon removal on the same day as the Mirena insertion.  She uses the Diva cup.   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  Getting married in [place] next year.  GYNECOLOGIC HISTORY: No LMP recorded. (Menstrual status: IUD). Contraception:  Mirena IUD 10-16-19 Menopausal hormone therapy:  n/a Last mammogram:  n/a Last pap smear:  08-10-18 Neg,08-02-17 Neg, 07/2016 Neg, 07/2015 Neg:Pos Hr HPV         OB History    Gravida  0   Para  0   Term  0   Preterm  0   AB  0   Living  0     SAB  0   TAB  0   Ectopic  0   Multiple  0   Live Births  0              Patient Active Problem List   Diagnosis Date Noted  . Thyroid nodule 07/17/2019  . Eczema 07/19/2017  . Allergic rhinitis 07/19/2017    Past Medical History:  Diagnosis Date  . Abnormal Pap smear of cervix 07/2015   Neg/Pos HR HPV--in [place]--no colpo/no treatment  . Allergy   . Eczema   . Thyroid nodule 07/2018    History reviewed. No pertinent surgical history.  Current Outpatient Medications  Medication Sig Dispense Refill  . cetirizine (ZYRTEC) 10 MG tablet Take 10 mg by mouth daily.    . fluticasone (FLONASE) 50 MCG/ACT nasal spray Place 1 spray into both nostrils daily. 16 g 2  . levonorgestrel (MIRENA) 20 MCG/24HR IUD 1 each by Intrauterine route once.    . montelukast (SINGULAIR) 10 MG tablet Take 1 tablet (10 mg total) by mouth at bedtime. 90 tablet 3  . triamcinolone cream (KENALOG) 0.1 % Apply 1 application topically daily as needed. 45 g 2   No current facility-administered medications for this visit.      ALLERGIES: Patient has no known allergies.  Family History  Problem Relation Age of Onset  . Arthritis  Mother   . Hyperlipidemia Mother   . Diabetes Father   . Cancer Father        prostate  . Thyroid disease Father        hypothyroid  . Heart disease Maternal Grandmother   . Hyperlipidemia Maternal Grandmother   . Stroke Maternal Grandmother   . Stroke Paternal Grandmother   . Hypertension Paternal Grandmother   . Cancer Paternal Grandfather 59       Dec colon cancer    Social History   Socioeconomic History  . Marital status: Single    Spouse name: Not on file  . Number of children: Not on file  . Years of education: Not on file  . Highest education level: Not on file  Occupational History  . Not on file  Social Needs  . Financial resource strain: Not on file  . Food insecurity    Worry: Not on file    Inability: Not on file  . Transportation needs    Medical: Not on file    Non-medical: Not on file  Tobacco Use  . Smoking status: Never  Smoker  . Smokeless tobacco: Never Used  Substance and Sexual Activity  . Alcohol use: Yes    Alcohol/week: 3.0 standard drinks    Types: 3 Cans of beer per week  . Drug use: No  . Sexual activity: Yes    Partners: Male    Birth control/protection: Implant    Comment: Nexplanon inserted 10-26-14/Lt.arm  Lifestyle  . Physical activity    Days per week: Not on file    Minutes per session: Not on file  . Stress: Not on file  Relationships  . Social Musician on phone: Not on file    Gets together: Not on file    Attends religious service: Not on file    Active member of club or organization: Not on file    Attends meetings of clubs or organizations: Not on file    Relationship status: Not on file  . Intimate partner violence    Fear of current or ex partner: Not on file    Emotionally abused: Not on file    Physically abused: Not on file    Forced sexual activity: Not on file  Other Topics Concern  . Not on file  Social History Narrative  . Not on file    Review of Systems  All other systems reviewed and  are negative.   PHYSICAL EXAMINATION:    BP 110/62   Pulse 70   Temp (!) 97.4 F (36.3 C) (Temporal)   Ht 5' 4.5" (1.638 m)   Wt 144 lb 6.4 oz (65.5 kg)   BMI 24.40 kg/m     General appearance: alert, cooperative and appears stated age   Pelvic: External genitalia:  no lesions              Urethra:  normal appearing urethra with no masses, tenderness or lesions              Bartholins and Skenes: normal                 Vagina: normal appearing vagina with normal color and discharge, no lesions              Cervix: no lesions.  Small amount of brown blood.  IUD strings noted.                 Bimanual Exam:  Uterus:  normal size, contour, position, consistency, mobility, non-tender              Adnexa: no mass, fullness, tenderness           Chaperone was present for exam.  ASSESSMENT  Mirena IUD check up. Doing well.  PLAN  Reassurance regarding IUD position, functionality, and bleeding profile.  Ok to use Diva cup. Fu for annual exam and prn.    An After Visit Summary was printed and given to the patient.  __15____ minutes face to face time of which over 50% was spent in counseling.

## 2019-12-18 ENCOUNTER — Other Ambulatory Visit: Payer: Self-pay | Admitting: Family Medicine

## 2019-12-18 DIAGNOSIS — J309 Allergic rhinitis, unspecified: Secondary | ICD-10-CM

## 2019-12-18 MED FILL — MONTELUKAST SOD 10 MG TAB: 10 | 90 days supply | Qty: 90 | Fill #1

## 2019-12-18 MED FILL — FLUTICASONE PROP 50 MCG SPR: 50 | 60 days supply | Qty: 16 | Fill #0

## 2020-04-09 ENCOUNTER — Encounter: Payer: Self-pay | Admitting: Family Medicine

## 2020-04-12 ENCOUNTER — Other Ambulatory Visit: Payer: Self-pay

## 2020-04-15 ENCOUNTER — Ambulatory Visit (INDEPENDENT_AMBULATORY_CARE_PROVIDER_SITE_OTHER): Payer: No Typology Code available for payment source | Admitting: Family Medicine

## 2020-04-15 ENCOUNTER — Encounter: Payer: Self-pay | Admitting: Family Medicine

## 2020-04-15 ENCOUNTER — Other Ambulatory Visit: Payer: Self-pay

## 2020-04-15 VITALS — BP 100/60 | HR 92 | Temp 97.1°F | Resp 12 | Ht 64.5 in | Wt 151.4 lb

## 2020-04-15 DIAGNOSIS — M545 Low back pain, unspecified: Secondary | ICD-10-CM

## 2020-04-15 NOTE — Patient Instructions (Signed)
A few things to remember from today's visit:   Acute left-sided low back pain, unspecified whether sciatica present - Plan: Ambulatory referral to Physical Therapy  PT will be arranged. Caution with exercises that could aggravate problem. Stretching exercises,local ice,massage,and icy hot may help. Let me know if you want a muscle relaxant.

## 2020-04-15 NOTE — Progress Notes (Signed)
ACUTE VISIT  Chief Complaint  Patient presents with  . Back Pain    lower back pain off and on for a few months, would like referral to PT   HPI: Angelica Norman is a 29 y.o. female, who is here today with above complaint.  Negative for recent injury or unusual level of activity. She exercises regularly: Wt lifting,yoga,and very active. Her fianc is her trainer. 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  Pain was radiated to lateral aspect of left lower extremity down to ankle x1.  Pain is not radiated at this time, dull/achy like, 4/10 in intensity, with no associated LE numbness, tingling, urinary incontinence or retention, stool incontinence, or saddle anesthesia.  Exacerbated by certain movements. Alleviated by rest and massage (she had one last week and helped). No rash or edema on area, fever, chills, or abnormal wt loss.  Prior Hx of back pain: Negative. Problem seems to be stable.   OTC medications: Ibuprofen.  Review of Systems  Constitutional: Negative for activity change and appetite change.  Gastrointestinal: Negative for abdominal pain, nausea and vomiting.  Genitourinary: Negative for decreased urine volume, dysuria and hematuria.  Hematological: Negative for adenopathy. Does not bruise/bleed easily.  Rest see pertinent positives and negatives per HPI.  Current Outpatient Medications on File Prior to Visit  Medication Sig Dispense Refill  . cetirizine (ZYRTEC) 10 MG tablet Take 10 mg by mouth daily.    . fluticasone (FLONASE) 50 MCG/ACT nasal spray USE 1 SPRAY INTO EACH NOSTRIL DAILY. 16 g 2  . levonorgestrel (MIRENA) 20 MCG/24HR IUD 1 each by Intrauterine route once.    . montelukast (SINGULAIR) 10 MG tablet Take 1 tablet (10 mg total) by mouth at bedtime. 90 tablet 3  . triamcinolone cream (KENALOG) 0.1 % Apply 1 application topically daily as needed. 45 g 2   No current facility-administered medications on file  prior to visit.   Past Medical History:  Diagnosis Date  . Abnormal Pap smear of cervix 07/2015   Neg/Pos HR HPV--in [place]--no colpo/no treatment  . Allergy   . Eczema   . Thyroid nodule 07/2018   No Known Allergies  Social History   Socioeconomic History  . Marital status: Single    Spouse name: Not on file  . Number of children: Not on file  . Years of education: Not on file  . Highest education level: Not on file  Occupational History  . Not on file  Tobacco Use  . Smoking status: Never Smoker  . Smokeless tobacco: Never Used  Substance and Sexual Activity  . Alcohol use: Yes    Alcohol/week: 3.0 standard drinks    Types: 3 Cans of beer per week  . Drug use: No  . Sexual activity: Yes    Partners: Male    Birth control/protection: Implant    Comment: Nexplanon inserted 10-26-14/Lt.arm  Other Topics Concern  . Not on file  Social History Narrative  . Not on file   Social Determinants of Health   Financial Resource Strain:   . Difficulty of Paying Living Expenses:   Food Insecurity:   . Worried About Charity fundraiser in the Last Year:   . Arboriculturist in the Last Year:   Transportation Needs:   . Film/video editor (Medical):   Marland Kitchen Lack of Transportation (Non-Medical):   Physical Activity:   . Days of Exercise per Week:   . Minutes  of Exercise per Session:   Stress:   . Feeling of Stress :   Social Connections:   . Frequency of Communication with Friends and Family:   . Frequency of Social Gatherings with Friends and Family:   . Attends Religious Services:   . Active Member of Clubs or Organizations:   . Attends Banker Meetings:   Marland Kitchen Marital Status:     Vitals:   04/15/20 0830  BP: 100/60  Pulse: 92  Resp: 12  Temp: (!) 97.1 F (36.2 C)  SpO2: 98%   Body mass index is 25.58 kg/m.  Physical Exam  Nursing note and vitals reviewed. Constitutional: She is oriented to person, place, and time. She appears well-developed  and well-nourished. She does not appear ill. No distress.  HENT:  Head: Atraumatic.  Eyes: Conjunctivae are normal.  Cardiovascular: Normal rate and regular rhythm.  Pulses:      Posterior tibial pulses are 2+ on the right side and 2+ on the left side.  Respiratory: Effort normal and breath sounds normal. No respiratory distress.  GI: Soft. She exhibits no mass. There is no hepatomegaly. There is no abdominal tenderness.  Musculoskeletal:        General: No edema.     Lumbar back: Spasms present. No tenderness or bony tenderness.       Back:     Comments: No significant deformity appreciated. No tenderness upon palpation of paraspinal muscles. Pain is not elicited with movement on exam table during examination. No local edema or erythema appreciated, no suspicious lesions.  Neurological: She is alert and oriented to person, place, and time. She has normal strength. Gait normal.  Reflex Scores:      Patellar reflexes are 2+ on the right side and 2+ on the left side. SLR negative bilateral.  Skin: Skin is warm. No rash noted. No erythema.  Psychiatric: She has a normal mood and affect.  Well groomed, good eye contact.    ASSESSMENT AND PLAN:  Fred was seen today for back pain.  Diagnoses and all orders for this visit:  Acute left-sided low back pain, unspecified whether sciatica present -     Ambulatory referral to Physical Therapy  Most likely musculoskeletal. I do not think imaging or blood work are needed at this time but will be considered if problem is persistent or gets worse. Recommend caution with exercises. Local heat, massage, and OTC IcyHot may help. For now just interested in muscle relaxers, she will let me know if she decides to try. PT will be arranged.  Return if symptoms worsen or fail to improve.   Majestic Brister G. Swazil[place], MD  [facility]. [facility].  Discharge Instructions   None     A few things to remember from today's  visit:  Acute left-sided low back pain, unspecified whether sciatica present - Plan: Ambulatory referral to Physical Therapy  PT will be arranged. Caution with exercises that could aggravate problem. Stretching exercises,local ice,massage,and icy hot may help. Let me know if you want a muscle relaxant.

## 2020-04-22 ENCOUNTER — Ambulatory Visit: Payer: No Typology Code available for payment source | Attending: Family Medicine

## 2020-04-22 ENCOUNTER — Other Ambulatory Visit: Payer: Self-pay

## 2020-04-22 DIAGNOSIS — M545 Low back pain, unspecified: Secondary | ICD-10-CM

## 2020-04-22 DIAGNOSIS — M6281 Muscle weakness (generalized): Secondary | ICD-10-CM | POA: Insufficient documentation

## 2020-04-22 DIAGNOSIS — R252 Cramp and spasm: Secondary | ICD-10-CM | POA: Insufficient documentation

## 2020-04-22 DIAGNOSIS — G8929 Other chronic pain: Secondary | ICD-10-CM | POA: Diagnosis present

## 2020-04-22 NOTE — Patient Instructions (Signed)
Access Code: [email or web address] URL: [email or web address] Date: 04/22/2020 Prepared by: Tresa Endo  Exercises Supine Figure 4 Piriformis Stretch - 2 x daily - 7 x weekly - 1 sets - 3 reps - 20 hold Seated Figure 4 Piriformis Stretch - 2 x daily - 7 x weekly - 1 sets - 3 reps - 20 hold Seated Hamstring Stretch - 2 x daily - 7 x weekly - 1 sets - 3 reps - 20 hold Forward T - 1 x daily - 7 x weekly - 10 reps - 3 sets Sidelying Open Book Thoracic Rotation with Knee on Foam Roll - 3 x daily - 7 x weekly - 1 sets - 10 reps Hip Hiking on Step - 2 x daily - 7 x weekly - 2 sets - 10 reps

## 2020-04-22 NOTE — Therapy (Signed)
[facility] [address] [place], [place], [address] Phone: [phone]   Fax:  [phone]  Physical Therapy Evaluation  Patient Details  Name: Ocia Simek MRN: [id number] Date of Birth: 1991/07/06 Referring Provider (PT): [place], Betty, MD   Encounter Date: 04/22/2020  PT End of Session - 04/22/20 1529    Visit Number  1    Date for PT Re-Evaluation  06/17/20    Authorization Type  [facility]    PT Start Time  1447    PT Stop Time  1528    PT Time Calculation (min)  41 min    Activity Tolerance  Patient tolerated treatment well    Behavior During Therapy  [facility] for tasks assessed/performed       Past Medical History:  Diagnosis Date  . Abnormal Pap smear of cervix 07/2015   Neg/Pos HR HPV--in [place]--no colpo/no treatment  . Allergy   . Eczema   . Thyroid nodule 07/2018    History reviewed. No pertinent surgical history.  There were no vitals filed for this visit.   Subjective Assessment - 04/22/20 1451    Subjective  Pt presents to PT with compliants of Lt sided LBP that began 6-9 months ago. Pt power lifts and teaches yoga.    Diagnostic tests  none    Patient Stated Goals  reduce pain, improve symmetry    Currently in Pain?  Yes    Pain Score  3    up to 4/10 after lifting   Pain Location  Buttocks    Pain Orientation  Left    Pain Descriptors / Indicators  Aching;Tightness    Pain Type  Chronic pain    Pain Onset  More than a month ago    Pain Frequency  Intermittent    Aggravating Factors   transfering patients, constant pain/background noise         [facility] PT Assessment - 04/22/20 0001      Assessment   Medical Diagnosis  acute Lt sided low back pain    Referring Provider (PT)  [place], Betty, MD    Onset Date/Surgical Date  08/24/19    Next MD Visit  none    Prior Therapy  none      Precautions   Precautions  None      Restrictions   Weight Bearing Restrictions   No      Balance Screen   Has the patient fallen in the past 6 months  No    Has the patient had a decrease in activity level because of a fear of falling?   No    Is the patient reluctant to leave their home because of a fear of falling?   No      Home Environment   Living Environment  Private residence    Type of Home  House      Prior Function   Level of Independence  Independent    Vocation  Full time employment    Vocation Requirements  OT at Inpatient Rehab    Leisure  teaches yoga, power lifting      Cognition   Overall Cognitive Status  Within Functional Limits for tasks assessed      Posture/Postural Control   Posture/Postural Control  Postural limitations      ROM / Strength   AROM / PROM / Strength  AROM;PROM;Strength      AROM   Overall AROM   Within functional limits  for tasks performed    Overall AROM Comments  Lumbar A/ROM is full.  Lt quadratus/lumbar tension with Rt motion      PROM   Overall PROM   Deficits    Overall PROM Comments  hip flexibility limited by 20% bil in all directions without pain.  Pain with overpressure FABER      Strength   Overall Strength  Within functional limits for tasks performed    Overall Strength Comments  Pt demonstrates Lt hip instability vs Rt with single limb activity ("T" extension with Rt LE).        Palpation   Spinal mobility  reduced PA mobility T10-L5 without pain    Palpation comment  trigger points and tension in Lt quadratus and upper gluteals      Transfers   Transfers  Independent with all Transfers      Ambulation/Gait   Gait Pattern  Within Functional Limits                  Objective measurements completed on examination: See above findings.              PT Education - 04/22/20 1525    Education Details  Access Code: [email or web address]    Person(s) Educated  Patient    Methods  Explanation;Demonstration;Handout    Comprehension  Verbalized understanding;Returned demonstration        PT Short Term Goals - 04/22/20 1530      PT SHORT TERM GOAL #1   Title  be independent in initial HEP    Time  4    Period  Weeks    Status  New    Target Date  05/20/20      PT SHORT TERM GOAL #2   Title  report a 30% reduction in Lt LBP/gluteal pain with physical activity    Time  4    Period  Weeks    Status  New    Target Date  06/17/20        PT Long Term Goals - 04/22/20 1531      PT LONG TERM GOAL #1   Title  be independent in advanced HEP    Time  8    Period  Weeks    Status  New    Target Date  06/17/20      PT LONG TERM GOAL #2   Title  report a 70% reduction in Lt sided LBP and gluteal pain with lifting and physical activity    Time  8    Period  Weeks    Status  New    Target Date  06/17/20      PT LONG TERM GOAL #3   Title  deonstrate Rt=Lt single limb stability with hip extension of opposite limb (T position) x 10    Time  8    Period  Weeks    Status  New    Target Date  06/17/20             Plan - 04/22/20 1702    Clinical Impression Statement  Pt presents to PT with Lt sided LBP and gluteal pain that began ~6-9 months ago without incident or injury.  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  Pt demonstrates full lumbar A/ROM with Lt quadratus/lumbar tension at end range Rt sidebending.  Pt with tension and trigger points in Lt quadratus and proximal gluteals and reduced segmental mobility in the lower  thoracic and lumbar spine.  Pt with reduced proximal strength/stability on the Lt hip and LE with single limb activity.  Pt with level pelvis today.  Pt will benefit from skilled PT to address trigger points, assymetries and Lt gluteal strength.    Examination-Activity Limitations  Other    Stability/Clinical Decision Making  Stable/Uncomplicated    Clinical Decision Making  Low    Rehab Potential  Excellent    PT Frequency  2x / week    PT Duration  8 weeks    PT Treatment/Interventions  ADLs/Self Care  Home Management;Cryotherapy;Moist Heat;Electrical Stimulation;Functional mobility training;Neuromuscular re-education;Therapeutic exercise;Therapeutic activities;Patient/family education;Taping;Passive range of motion;Dry needling    PT Next Visit Plan  DN to bil lumbar multifidi, Lt gluteals and quadratus.  Lt limb stability and glute medius strength,    PT Home Exercise Plan  Access Code: [email or web address]    Consulted and Agree with Plan of Care  Patient       Patient will benefit from skilled therapeutic intervention in order to improve the following deficits and impairments:  Decreased activity tolerance, Decreased strength, Pain, Increased muscle spasms  Visit Diagnosis: Chronic left-sided low back pain without sciatica - Plan: PT plan of care cert/re-cert  Cramp and spasm - Plan: PT plan of care cert/re-cert  Muscle weakness (generalized) - Plan: PT plan of care cert/re-cert     Problem List Patient Active Problem List   Diagnosis Date Noted  . Thyroid nodule 07/17/2019  . Eczema 07/19/2017  . Allergic rhinitis 07/19/2017    Lorrene Reid, PT 04/22/20 5:07 PM  [facility] [address] [place], [place], [address] Phone: [phone]   Fax:  [phone]  Name: Nixon Sparr MRN: [id number] Date of Birth: June 25, 1991

## 2020-04-27 ENCOUNTER — Encounter: Payer: Self-pay | Admitting: Family Medicine

## 2020-04-27 ENCOUNTER — Other Ambulatory Visit: Payer: Self-pay

## 2020-04-27 ENCOUNTER — Telehealth (INDEPENDENT_AMBULATORY_CARE_PROVIDER_SITE_OTHER): Payer: No Typology Code available for payment source | Admitting: Family Medicine

## 2020-04-27 VITALS — Ht 64.5 in | Wt 147.0 lb

## 2020-04-27 DIAGNOSIS — L089 Local infection of the skin and subcutaneous tissue, unspecified: Secondary | ICD-10-CM | POA: Diagnosis not present

## 2020-04-27 DIAGNOSIS — S0183XA Puncture wound without foreign body of other part of head, initial encounter: Secondary | ICD-10-CM | POA: Diagnosis not present

## 2020-04-27 MED ORDER — CEPHALEXIN 500 MG PO CAPS: 500.0000 mg | ORAL_CAPSULE | Freq: Three times a day (TID) | ORAL | 0 refills | Status: AC

## 2020-04-27 NOTE — Progress Notes (Signed)
   Virtual Visit via Video   I connected with patient on 04/27/20 at 12:30 PM EDT by a video enabled telemedicine application and verified that I am speaking with the correct person using two identifiers.  Location patient: Home Location provider: [facility], Office Persons participating in the virtual visit: Patient, Provider, CMA Donn Pierini)  I discussed the limitations of evaluation and management by telemedicine and the availability of in person appointments. The patient expressed understanding and agreed to proceed.  Subjective:   HPI:   Infected piercing- got nose pierced ~1 month ago.  3 days ago noticed that the skin above her piercing was a bit red and TTP.  Redness has spread across bridge of nose and now on R side.  Piercing is on L.  No abscess in the nostril.  Cleaning piercing w/ Dial soap once daily and a saline solution.  Has small bubble just above piercing.  No pain in nostril- pain is all external.  Pt feels pain has improved since last night and some of the redness on the L side.  No drainage, no fevers.    ROS:   See pertinent positives and negatives per HPI.  Patient Active Problem List   Diagnosis Date Noted  . Thyroid nodule 07/17/2019  . Eczema 07/19/2017  . Allergic rhinitis 07/19/2017    Social History   Tobacco Use  . Smoking status: Never Smoker  . Smokeless tobacco: Never Used  Substance Use Topics  . Alcohol use: Yes    Alcohol/week: 3.0 standard drinks    Types: 3 Cans of beer per week    Current Outpatient Medications:  .  cetirizine (ZYRTEC) 10 MG tablet, Take 10 mg by mouth daily., Disp: , Rfl:  .  fluticasone (FLONASE) 50 MCG/ACT nasal spray, USE 1 SPRAY INTO EACH NOSTRIL DAILY., Disp: 16 g, Rfl: 2 .  levonorgestrel (MIRENA) 20 MCG/24HR IUD, 1 each by Intrauterine route once., Disp: , Rfl:  .  montelukast (SINGULAIR) 10 MG tablet, Take 1 tablet (10 mg total) by mouth at bedtime., Disp: 90 tablet, Rfl: 3 .  triamcinolone cream  (KENALOG) 0.1 %, Apply 1 application topically daily as needed., Disp: 45 g, Rfl: 2  No Known Allergies  Objective:   Ht 5' 4.5" (1.638 m)   Wt 147 lb (66.7 kg)   LMP 04/10/2020 Comment: IUD  BMI 24.84 kg/m   AAOx3, NAD NCAT, EOMI No obvious CN deficits Mild erythema over bridge of nose Coloring WNL Pt is able to speak clearly, coherently without shortness of breath or increased work of breathing.  Thought process is linear.  Mood is appropriate.   Assessment and Plan:   Infected piercing- new.  Pt's TTP and overlying redness are consistent w/ cellulitis.  This is complicated by mask wearing.  Pt denies pus or signs of MRSA/abscess.  Will start Keflex TID x7 and reviewed need to keep clean.  If no improvement, may need culture or abx change.  Pt expressed understanding and is in agreement w/ plan.    Neena Rhymes, MD 04/27/2020

## 2020-04-29 ENCOUNTER — Encounter: Payer: Self-pay | Admitting: Physical Therapy

## 2020-04-29 ENCOUNTER — Other Ambulatory Visit: Payer: Self-pay

## 2020-04-29 ENCOUNTER — Ambulatory Visit: Payer: No Typology Code available for payment source | Admitting: Physical Therapy

## 2020-04-29 DIAGNOSIS — R252 Cramp and spasm: Secondary | ICD-10-CM

## 2020-04-29 DIAGNOSIS — M545 Low back pain, unspecified: Secondary | ICD-10-CM

## 2020-04-29 DIAGNOSIS — M6281 Muscle weakness (generalized): Secondary | ICD-10-CM

## 2020-04-29 DIAGNOSIS — G8929 Other chronic pain: Secondary | ICD-10-CM

## 2020-04-29 NOTE — Therapy (Signed)
[facility] [address], [place], [place], [address] Phone: [phone]   Fax:  [phone]  Physical Therapy Treatment  Patient Details  Name: Angelica Norman MRN: [id number] Date of Birth: 1991-04-03 Referring Provider (PT): Martin[place], Betty, MD   Encounter Date: 04/29/2020  PT End of Session - 04/29/20 1144    Visit Number  2    Date for PT Re-Evaluation  06/17/20    Authorization Type  [facility]    PT Start Time  1100    PT Stop Time  1152    PT Time Calculation (min)  52 min    Activity Tolerance  Patient tolerated treatment well    Behavior During Therapy  [facility] for tasks assessed/performed       Past Medical History:  Diagnosis Date  . Abnormal Pap smear of cervix 07/2015   Neg/Pos HR HPV--in [place]--no colpo/no treatment  . Allergy   . Eczema   . Thyroid nodule 07/2018    History reviewed. No pertinent surgical history.  There were no vitals filed for this visit.  Subjective Assessment - 04/29/20 1103    Subjective  Just was at gym.                        [facility] Adult PT Treatment/Exercise - 04/29/20 0001      Exercises   Exercises  Knee/Hip      Knee/Hip Exercises: Stretches   Piriformis Stretch  Left;2 reps;30 seconds    Piriformis Stretch Limitations  push away x 1, pull across x 1      Knee/Hip Exercises: Standing   Forward Lunges Limitations  static with red hip abd band with 8lb trunk rotation over front knee, 5 each      Modalities   Modalities  Moist Heat      Moist Heat Therapy   Number Minutes Moist Heat  10 Minutes    Moist Heat Location  Hip   Left     Manual Therapy   Manual Therapy  Soft tissue mobilization    Soft tissue mobilization  gluteals on Lt and lumbar multifdi bil after DN       Trigger Point Dry Needling - 04/29/20 0001    Consent Given?  Yes    Education Handout Provided  Yes    Muscles Treated Back/Hip  Gluteus  minimus;Gluteus medius;Piriformis;Lumbar multifidi    Dry Needling Comments  L4-S1    Other Dry Needling  Lt hip, bil multifidi    Gluteus Minimus Response  Twitch response elicited;Palpable increased muscle length    Gluteus Medius Response  Twitch response elicited;Palpable increased muscle length    Piriformis Response  Twitch response elicited;Palpable increased muscle length    Lumbar multifidi Response  Twitch response elicited;Palpable increased muscle length           PT Education - 04/29/20 1138    Education Details  DN aftercare handout, Access Code: [email or web address]    Person(s) Educated  Patient    Methods  Explanation;Handout       PT Short Term Goals - 04/22/20 1530      PT SHORT TERM GOAL #1   Title  be independent in initial HEP    Time  4    Period  Weeks    Status  New    Target Date  05/20/20      PT SHORT TERM GOAL #2   Title  report a 30% reduction  in Lt LBP/gluteal pain with physical activity    Time  4    Period  Weeks    Status  New    Target Date  06/17/20        PT Long Term Goals - 04/22/20 1531      PT LONG TERM GOAL #1   Title  be independent in advanced HEP    Time  8    Period  Weeks    Status  New    Target Date  06/17/20      PT LONG TERM GOAL #2   Title  report a 70% reduction in Lt sided LBP and gluteal pain with lifting and physical activity    Time  8    Period  Weeks    Status  New    Target Date  06/17/20      PT LONG TERM GOAL #3   Title  deonstrate Rt=Lt single limb stability with hip extension of opposite limb (T position) x 10    Time  8    Period  Weeks    Status  New    Target Date  06/17/20            Plan - 04/29/20 1246    Clinical Impression Statement  Pt presented with signif tone/tension and trigger points in Lt lateral hip and lumbar multifidi.  PT performed DN, STM and heat for LT glut min/med piriformis and bil lumbar multifidi L4-S1 with good release.  Pt reported feeling looser end of session  although sore.  She had better elongation with Lt hip stretches after DN.  PT reviewed several reps of mini static lunge with red hip abd band and trunk rotation with 8lb dumbbell over front knee as strength option for HEP.  Pt will continue to benefit from ongoing assessment of response to DN between visits.  Progress Lt lumbopelvic-hip stability as tolerated.    Rehab Potential  Excellent    PT Frequency  2x / week    PT Duration  8 weeks    PT Treatment/Interventions  ADLs/Self Care Home Management;Cryotherapy;Moist Heat;Electrical Stimulation;Functional mobility training;Neuromuscular re-education;Therapeutic exercise;Therapeutic activities;Patient/family education;Taping;Passive range of motion;Dry needling    PT Next Visit Plan  f/u on DN to Lt hip and bil multifidi, progress Lt limb stability and glut med strength, hip and lumbar stretching on Lt    PT Home Exercise Plan  Access Code: [email or web address]    Consulted and Agree with Plan of Care  Patient       Patient will benefit from skilled therapeutic intervention in order to improve the following deficits and impairments:     Visit Diagnosis: Chronic left-sided low back pain without sciatica  Cramp and spasm  Muscle weakness (generalized)     Problem List Patient Active Problem List   Diagnosis Date Noted  . Thyroid nodule 07/17/2019  . Eczema 07/19/2017  . Allergic rhinitis 07/19/2017    Morton Peters, PT 04/29/20 12:50 PM   [facility] [address], [address] [place], [place], [address] Phone: [phone]   Fax:  [phone]  Name: Umi Mainor MRN: [id number] Date of Birth: 04/28/1991

## 2020-04-29 NOTE — Patient Instructions (Signed)

## 2020-05-13 ENCOUNTER — Encounter: Payer: No Typology Code available for payment source | Admitting: Physical Therapy

## 2020-05-20 ENCOUNTER — Ambulatory Visit: Payer: No Typology Code available for payment source | Attending: Family Medicine | Admitting: Physical Therapy

## 2020-05-20 ENCOUNTER — Other Ambulatory Visit: Payer: Self-pay

## 2020-05-20 ENCOUNTER — Encounter: Payer: Self-pay | Admitting: Physical Therapy

## 2020-05-20 DIAGNOSIS — M545 Low back pain, unspecified: Secondary | ICD-10-CM

## 2020-05-20 DIAGNOSIS — R252 Cramp and spasm: Secondary | ICD-10-CM | POA: Diagnosis present

## 2020-05-20 DIAGNOSIS — G8929 Other chronic pain: Secondary | ICD-10-CM | POA: Insufficient documentation

## 2020-05-20 DIAGNOSIS — M6281 Muscle weakness (generalized): Secondary | ICD-10-CM | POA: Insufficient documentation

## 2020-05-20 NOTE — Therapy (Signed)
[facility] [address], [place], [place], [address] Phone: [phone]   Fax:  [phone]  Physical Therapy Treatment  Patient Details  Name: Angelica Norman MRN: [id number] Date of Birth: [birth year] Referring Provider (PT): Martinique, Betty, MD   Encounter Date: 05/20/2020   PT End of Session - 05/20/20 1022    Visit Number 3    Date for PT Re-Evaluation 06/17/20    Authorization Type [facility]    PT Start Time 1017    PT Stop Time 1056    PT Time Calculation (min) 39 min    Activity Tolerance Patient tolerated treatment well    Behavior During Therapy [facility] for tasks assessed/performed           Past Medical History:  Diagnosis Date  . Abnormal Pap smear of cervix 07/2015   Neg/Pos HR HPV--in [place]--no colpo/no treatment  . Allergy   . Eczema   . Thyroid nodule 07/2018    History reviewed. No pertinent surgical history.  There were no vitals filed for this visit.   Subjective Assessment - 05/20/20 1019    Subjective DN helped x 2 weeks and then tightness/pain returned but to a lesser degree.  More stiffness vs pain after a max effort lifting day.  30% improvement with first DN session.    Diagnostic tests none    Patient Stated Goals reduce pain, improve symmetry    Currently in Pain? Yes    Pain Score 2     Pain Location Hip    Pain Orientation Left;Lateral    Pain Descriptors / Indicators Tightness    Pain Type Chronic pain    Pain Onset More than a month ago    Pain Frequency Intermittent                             [facility] Adult PT Treatment/Exercise - 05/20/20 0001      Knee/Hip Exercises: Stretches   Hip Flexor Stretch Left;Right;1 rep;30 seconds    Piriformis Stretch Left;30 seconds    Piriformis Stretch Limitations pull knee across and push knee away in fig 4, 1x30 each    Other Knee/Hip Stretches A/ROM hip IR/ER x 10 reps    Other Knee/Hip Stretches  pigeon x 1' on Lt      Knee/Hip Exercises: Standing   SLS forward T holding 8lb x 10     SLS with Vectors Lt SLS with mini lunge taps fwd/side/back x 5 cycles standing on foam    Other Standing Knee Exercises blue band around knees sidestepping and monster walks fwd/bwd 5 steps each x 3 cycles in static squat            Trigger Point Dry Needling - 05/20/20 0001    Consent Given? Yes    Education Handout Provided Previously provided    Muscles Treated Back/Hip Gluteus medius;Piriformis    Gluteus Medius Response Twitch response elicited;Palpable increased muscle length    Piriformis Response Twitch response elicited;Palpable increased muscle length                  PT Short Term Goals - 05/20/20 1022      PT SHORT TERM GOAL #1   Title be independent in initial HEP    Status Achieved      PT SHORT TERM GOAL #2   Title report a 30% reduction in Lt LBP/gluteal pain with physical activity  Status Achieved             PT Long Term Goals - 05/20/20 1022      PT LONG TERM GOAL #1   Title be independent in advanced HEP    Time 8    Period Weeks    Status New    Target Date 06/17/20      PT LONG TERM GOAL #2   Title report a 70% reduction in Lt sided LBP and gluteal pain with lifting and physical activity    Baseline 30%    Time 8    Period Weeks    Status New    Target Date 06/17/20      PT LONG TERM GOAL #3   Title deonstrate Rt=Lt single limb stability with hip extension of opposite limb (T position) x 10    Status On-going                 Plan - 05/20/20 1052    Clinical Impression Statement Pt reports 2 weeks of relief following DN #1 last visit.  Return of Lt hip symptoms is 30% improved and feels more like tightness than pain.  Pt had no LBP, just Lt lateral hip tightness.  She has been able to tolerated max rep/resistance lifting since last visit without pain.  PT found TPs in glute med and piriformis with more localized need for DN than  first time today.  Pt demo's improving Lt LE closed chain Lt hip stability in SLS challenges today.  She chose to do ther ex without shoes on.  She needed to run errands so will do heat at home for DN aftercare.  Continue along POC with ongoing assessment of Lt hip and lumbar soft tissue mobility and strength.    Stability/Clinical Decision Making Stable/Uncomplicated    Rehab Potential Excellent    PT Frequency 2x / week    PT Duration 8 weeks    PT Treatment/Interventions ADLs/Self Care Home Management;Cryotherapy;Moist Heat;Electrical Stimulation;Functional mobility training;Neuromuscular re-education;Therapeutic exercise;Therapeutic activities;Patient/family education;Taping;Passive range of motion;Dry needling    PT Next Visit Plan f/u on Lt hip DN #2, DN #3 as needed, continue Lt LE SLS and DLS stability training, finalize HEP when pt ready for d/c    PT Home Exercise Plan Access Code: [email or web address]    Consulted and Agree with Plan of Care Patient           Patient will benefit from skilled therapeutic intervention in order to improve the following deficits and impairments:  Decreased activity tolerance, Decreased strength, Pain, Increased muscle spasms  Visit Diagnosis: Chronic left-sided low back pain without sciatica - Plan: PT plan of care cert/re-cert  Cramp and spasm - Plan: PT plan of care cert/re-cert  Muscle weakness (generalized) - Plan: PT plan of care cert/re-cert     Problem List Patient Active Problem List   Diagnosis Date Noted  . Thyroid nodule 07/17/2019  . Eczema 07/19/2017  . Allergic rhinitis 07/19/2017    Morton Peters, PT 05/20/20 10:58 AM   [facility] [address] [place], [place], [address] Phone: [phone]   Fax:  [phone]  Name: Angelica Norman MRN: [id number] Date of Birth: Jan 25, 1991

## 2020-05-28 ENCOUNTER — Ambulatory Visit: Payer: No Typology Code available for payment source

## 2020-05-28 ENCOUNTER — Other Ambulatory Visit: Payer: Self-pay

## 2020-05-28 ENCOUNTER — Encounter: Payer: Self-pay | Admitting: Family Medicine

## 2020-05-28 ENCOUNTER — Telehealth (INDEPENDENT_AMBULATORY_CARE_PROVIDER_SITE_OTHER): Payer: No Typology Code available for payment source | Admitting: Family Medicine

## 2020-05-28 DIAGNOSIS — M549 Dorsalgia, unspecified: Secondary | ICD-10-CM | POA: Diagnosis not present

## 2020-05-28 DIAGNOSIS — R252 Cramp and spasm: Secondary | ICD-10-CM

## 2020-05-28 DIAGNOSIS — M545 Low back pain, unspecified: Secondary | ICD-10-CM

## 2020-05-28 DIAGNOSIS — M6281 Muscle weakness (generalized): Secondary | ICD-10-CM

## 2020-05-28 DIAGNOSIS — G8929 Other chronic pain: Secondary | ICD-10-CM

## 2020-05-28 NOTE — Progress Notes (Signed)
Virtual Visit via Video Note I connected with Angelica Norman on 05/28/20 by a video enabled telemedicine application and verified that I am speaking with the correct person using two identifiers.  Location patient: home Location provider:work office Persons participating in the virtual visit: patient, provider  I discussed the limitations of evaluation and management by telemedicine and the availability of in person appointments. The patient expressed understanding and agreed to proceed.  Chief Complaint  Patient presents with   Rib out of place    requesting a referral   HPI: Angelica Norman is a 29 yo c/o a month of posterior right rib cage mild to moderate pain, around costovertebral area. No hx of trauma. Pain is constant,not radiated, stabbing/sharp pain. Pain is exacerbated by deep breathing and certain movements as well as lifting. She has not noted fever, chills, local edema or erythema, or rash. Negative for associated abdominal pain, N/V, changes in bowel habits, or urinary symptoms.  She has taken Tylenol sometimes. Stable. Currently he is doing PT for lower back pain, which has helped.  She would like a referral to chiropractor, she thinks she may need some adjustment/manipulation of affected area.  ROS: See pertinent positives and negatives per HPI.  Past Medical History:  Diagnosis Date   Abnormal Pap smear of cervix 07/2015   Neg/Pos HR HPV--in [place]--no colpo/no treatment   Allergy    Eczema    Thyroid nodule 07/2018   History reviewed. No pertinent surgical history.  Family History  Problem Relation Age of Onset   Arthritis Mother    Hyperlipidemia Mother    Diabetes Father    Cancer Father        prostate   Thyroid disease Father        hypothyroid   Heart disease Maternal Grandmother    Hyperlipidemia Maternal Grandmother    Stroke Maternal Grandmother    Stroke Paternal Grandmother    Hypertension Paternal Grandmother    Cancer Paternal  Grandfather 65       Dec colon cancer    Social History   Socioeconomic History   Marital status: Single    Spouse name: Not on file   Number of children: Not on file   Years of education: Not on file   Highest education level: Not on file  Occupational History   Not on file  Tobacco Use   Smoking status: Never Smoker   Smokeless tobacco: Never Used  Vaping Use   Vaping Use: Never used  Substance and Sexual Activity   Alcohol use: Yes    Alcohol/week: 3.0 standard drinks    Types: 3 Cans of beer per week   Drug use: No   Sexual activity: Yes    Partners: Male    Birth control/protection: Implant    Comment: Nexplanon inserted 10-26-14/Lt.arm  Other Topics Concern   Not on file  Social History Narrative   Not on file   Social Determinants of Health   Financial Resource Strain:    Difficulty of Paying Living Expenses:   Food Insecurity:    Worried About Charity fundraiser in the Last Year:    Arboriculturist in the Last Year:   Transportation Needs:    Film/video editor (Medical):    Lack of Transportation (Non-Medical):   Physical Activity:    Days of Exercise per Week:    Minutes of Exercise per Session:   Stress:    Feeling of Stress :   Social Connections:  Frequency of Communication with Friends and Family:    Frequency of Social Gatherings with Friends and Family:    Attends Religious Services:    Active Member of Clubs or Organizations:    Attends Engineer, structural:    Marital Status:   Intimate Partner Violence:    Fear of Current or Ex-Partner:    Emotionally Abused:    Physically Abused:    Sexually Abused:     Current Outpatient Medications:    cetirizine (ZYRTEC) 10 MG tablet, Take 10 mg by mouth daily., Disp: , Rfl:    fluticasone (FLONASE) 50 MCG/ACT nasal spray, USE 1 SPRAY INTO EACH NOSTRIL DAILY., Disp: 16 g, Rfl: 2   levonorgestrel (MIRENA) 20 MCG/24HR IUD, 1 each by Intrauterine  route once., Disp: , Rfl:    montelukast (SINGULAIR) 10 MG tablet, Take 1 tablet (10 mg total) by mouth at bedtime., Disp: 90 tablet, Rfl: 3   triamcinolone cream (KENALOG) 0.1 %, Apply 1 application topically daily as needed., Disp: 45 g, Rfl: 2  EXAM:  VITALS per patient if applicable:N/A  GENERAL: alert, oriented, appears well and in no acute distress  HEENT: atraumatic, conjunctiva clear, no obvious abnormalities on inspection.  LUNGS: on inspection no signs of respiratory distress, breathing rate appears normal, no obvious gross SOB, gasping or wheezing  CV: no obvious cyanosis  MS: moves all visible extremities without noticeable abnormality  PSYCH/NEURO: pleasant and cooperative, no obvious depression or anxiety, speech and thought processing grossly intact  ASSESSMENT AND PLAN:  Discussed the following assessment and plan:  Right-sided back pain, unspecified back location, unspecified chronicity - Plan: Ambulatory referral to Chiropractic  She does not think muscle relaxant will help. Continue Tylenol 500 mg 3-4 times per day as needed. For now imaging is not necessary. Referral to chiropractor placed, she would like to see provider at [facility], if it is part of her net work.  I discussed the assessment and treatment plan with the patient. [name] was provided an opportunity to ask questions and all were answered. She agreed with the plan and demonstrated an understanding of the instructions.   Return if symptoms worsen or fail to improve.   Refugio Mcconico [place], MD

## 2020-05-28 NOTE — Therapy (Signed)
[facility] [address] [place], [place], [address] Phone: [phone]   Fax:  [phone]  Physical Therapy Treatment  Patient Details  Name: Angelica Norman MRN: [id number] Date of Birth: 09-07-1991 Referring Provider (PT): [place], Betty, MD   Encounter Date: 05/28/2020   PT End of Session - 05/28/20 1228    Visit Number 4    Date for PT Re-Evaluation 06/17/20    Authorization Type [facility]    PT Start Time 1146   dry needling   PT Stop Time 1224    PT Time Calculation (min) 38 min    Activity Tolerance Patient tolerated treatment well    Behavior During Therapy [facility] for tasks assessed/performed           Past Medical History:  Diagnosis Date  . Abnormal Pap smear of cervix 07/2015   Neg/Pos HR HPV--in [place]--no colpo/no treatment  . Allergy   . Eczema   . Thyroid nodule 07/2018    History reviewed. No pertinent surgical history.  There were no vitals filed for this visit.   Subjective Assessment - 05/28/20 1149    Subjective Things are feeling good.  I did max effort squat and dead lift.  My symmetry is better and I feel like it is equal.    Currently in Pain? No/denies   just tight                            [facility] Adult PT Treatment/Exercise - 05/28/20 0001      Manual Therapy   Manual Therapy Soft tissue mobilization    Soft tissue mobilization gluteals on Lt and lumbar multifdi bil after DN            Trigger Point Dry Needling - 05/28/20 0001    Consent Given? Yes    Education Handout Provided Previously provided    Muscles Treated Back/Hip Gluteus medius;Piriformis;Lumbar multifidi    Gluteus Medius Response Twitch response elicited;Palpable increased muscle length    Piriformis Response Twitch response elicited;Palpable increased muscle length    Lumbar multifidi Response Twitch response elicited;Palpable increased muscle length                   PT Short Term Goals - 05/28/20 1219      PT SHORT TERM GOAL #1   Title be independent in initial HEP    Status Achieved      PT SHORT TERM GOAL #2   Title report a 30% reduction in Lt LBP/gluteal pain with physical activity    Status Achieved             PT Long Term Goals - 05/28/20 1219      PT LONG TERM GOAL #2   Title report a 70% reduction in Lt sided LBP and gluteal pain with lifting and physical activity    Baseline 30%    Time 8    Period Weeks    Status On-going                 Plan - 05/28/20 1231    Clinical Impression Statement Pt continues to benefit from dry needling to Lt gluteals.   Return of Lt hip symptoms is 30% improved and feels more like tightness than pain.  Pt with increased lumbar stiffness today.  She has been able to tolerate max rep/resistance lifting since last visit without pain, just stiffness.  PT found  tension and trigger points in glute med and piriformis and demonstrated improved tissue mobility and reduced size of trigger points after manual therapy today.  Pt is consistent and compliant with HEP for hip strength and stability.  No further additions are needed at this time. Continue along POC with ongoing assessment of Lt hip and lumbar soft tissue mobility and strength.    PT Frequency 2x / week    PT Duration 8 weeks    PT Treatment/Interventions ADLs/Self Care Home Management;Cryotherapy;Moist Heat;Electrical Stimulation;Functional mobility training;Neuromuscular re-education;Therapeutic exercise;Therapeutic activities;Patient/family education;Taping;Passive range of motion;Dry needling    PT Next Visit Plan repeat DN to Lt gluteals and multifidi as needed, check stability on Rt vs Lt    PT Home Exercise Plan Access Code: [email or web address]    Consulted and Agree with Plan of Care Patient           Patient will benefit from skilled therapeutic intervention in order to improve the following deficits and impairments:   Decreased activity tolerance, Decreased strength, Pain, Increased muscle spasms  Visit Diagnosis: Chronic left-sided low back pain without sciatica  Cramp and spasm  Muscle weakness (generalized)     Problem List Patient Active Problem List   Diagnosis Date Noted  . Thyroid nodule 07/17/2019  . Eczema 07/19/2017  . Allergic rhinitis 07/19/2017     Sigurd Sos, PT 05/28/20 12:32 PM  [facility] [address] [place], [place], [address] Phone: [phone]   Fax:  [phone]  Name: Jazel Nimmons MRN: [id number] Date of Birth: 08-25-1991

## 2020-06-03 ENCOUNTER — Other Ambulatory Visit: Payer: Self-pay

## 2020-06-03 ENCOUNTER — Ambulatory Visit: Payer: No Typology Code available for payment source

## 2020-06-03 DIAGNOSIS — M545 Low back pain, unspecified: Secondary | ICD-10-CM

## 2020-06-03 DIAGNOSIS — M6281 Muscle weakness (generalized): Secondary | ICD-10-CM

## 2020-06-03 DIAGNOSIS — R252 Cramp and spasm: Secondary | ICD-10-CM

## 2020-06-03 DIAGNOSIS — G8929 Other chronic pain: Secondary | ICD-10-CM

## 2020-06-03 NOTE — Therapy (Signed)
[facility] [address], [place], [place], [address] Phone: [phone]   Fax:  [phone]  Physical Therapy Treatment  Patient Details  Name: Angelica Norman MRN: [id number] Date of Birth: 03-01-1991 Referring Provider (PT): Martinique, Betty, MD   Encounter Date: 06/03/2020   PT End of Session - 06/03/20 1438    Visit Number 5    Date for PT Re-Evaluation 06/17/20    Authorization Type [facility]    PT Start Time 1660    PT Stop Time 1439    PT Time Calculation (min) 34 min    Activity Tolerance Patient tolerated treatment well    Behavior During Therapy [facility] for tasks assessed/performed           Past Medical History:  Diagnosis Date  . Abnormal Pap smear of cervix 07/2015   Neg/Pos HR HPV--in [place]--no colpo/no treatment  . Allergy   . Eczema   . Thyroid nodule 07/2018    History reviewed. No pertinent surgical history.  There were no vitals filed for this visit.   Subjective Assessment - 06/03/20 1407    Subjective This week is the best that I have felt in general in a while.  I did speed dead lifts and sqatting and felt great.  The needling is really helping.    Currently in Pain? No/denies                             [facility] Adult PT Treatment/Exercise - 06/03/20 0001      Manual Therapy   Manual Therapy Soft tissue mobilization    Soft tissue mobilization gluteals on Lt and lumbar/thoracic multifdi bil after DN            Trigger Point Dry Needling - 06/03/20 0001    Consent Given? Yes    Education Handout Provided Previously provided    Muscles Treated Back/Hip Gluteus medius;Piriformis;Lumbar multifidi    Dry Needling Comments thoracic multifidi T10-12 bil    Gluteus Medius Response Twitch response elicited;Palpable increased muscle length    Piriformis Response Twitch response elicited;Palpable increased muscle length    Lumbar multifidi Response Twitch  response elicited;Palpable increased muscle length                  PT Short Term Goals - 05/28/20 1219      PT SHORT TERM GOAL #1   Title be independent in initial HEP    Status Achieved      PT SHORT TERM GOAL #2   Title report a 30% reduction in Lt LBP/gluteal pain with physical activity    Status Achieved             PT Long Term Goals - 05/28/20 1219      PT LONG TERM GOAL #2   Title report a 70% reduction in Lt sided LBP and gluteal pain with lifting and physical activity    Baseline 30%    Time 8    Period Weeks    Status On-going                 Plan - 06/03/20 1439    Clinical Impression Statement Pt continues to benefit from dry needling to Lt gluteals.   Pt reports minimal to no symptoms in the Lt gluteals and low back this week.   She has been able to tolerate max rep/resistance lifting since last visit without pain or  significant  stiffness.  PT found tension and trigger points in Lt glute med and piriformis and demonstrated improved tissue mobility and reduced size of trigger points after manual therapy today.  Pt is consistent and compliant with HEP for hip strength and stability.  No further additions are needed at this time. 1 more session is probable with D/C.    PT Frequency 2x / week    PT Duration 8 weeks    PT Treatment/Interventions ADLs/Self Care Home Management;Cryotherapy;Moist Heat;Electrical Stimulation;Functional mobility training;Neuromuscular re-education;Therapeutic exercise;Therapeutic activities;Patient/family education;Taping;Passive range of motion;Dry needling    PT Next Visit Plan 1 more visit.  DN, FOTO, finalize HEP if needed    PT Home Exercise Plan Access Code: [email or web address]    Recommended Other Services initial cert is signed           Patient will benefit from skilled therapeutic intervention in order to improve the following deficits and impairments:     Visit Diagnosis: Cramp and spasm  Chronic left-sided low back  pain without sciatica  Muscle weakness (generalized)     Problem List Patient Active Problem List   Diagnosis Date Noted  . Thyroid nodule 07/17/2019  . Eczema 07/19/2017  . Allergic rhinitis 07/19/2017     Lorrene Reid, PT 06/03/20 2:42 PM  [facility] [address] [place], [place], [address] Phone: [phone]   Fax:  [phone]  Name: Nysia Dell MRN: [id number] Date of Birth: 12-Nov-1991

## 2020-06-18 ENCOUNTER — Other Ambulatory Visit: Payer: Self-pay

## 2020-06-18 ENCOUNTER — Ambulatory Visit: Payer: No Typology Code available for payment source | Attending: Family Medicine

## 2020-06-18 DIAGNOSIS — M545 Low back pain, unspecified: Secondary | ICD-10-CM

## 2020-06-18 DIAGNOSIS — R252 Cramp and spasm: Secondary | ICD-10-CM | POA: Diagnosis present

## 2020-06-18 DIAGNOSIS — M6281 Muscle weakness (generalized): Secondary | ICD-10-CM | POA: Diagnosis present

## 2020-06-18 DIAGNOSIS — G8929 Other chronic pain: Secondary | ICD-10-CM | POA: Insufficient documentation

## 2020-06-18 NOTE — Therapy (Signed)
[facility] [address], [place], [place], [address] Phone: [phone]   Fax:  [phone]  Physical Therapy Treatment  Patient Details  Name: Angelica Norman MRN: [id number] Date of Birth: Aug 02, 1991 Referring Provider (PT): Martinique, Betty, MD   Encounter Date: 06/18/2020   PT End of Session - 06/18/20 0806    Visit Number 6    Authorization Type [facility]    PT Start Time 0734    PT Stop Time 0800    PT Time Calculation (min) 26 min    Activity Tolerance Patient tolerated treatment well    Behavior During Therapy [facility] for tasks assessed/performed           Past Medical History:  Diagnosis Date  . Abnormal Pap smear of cervix 07/2015   Neg/Pos HR HPV--in [place]--no colpo/no treatment  . Allergy   . Eczema   . Thyroid nodule 07/2018    History reviewed. No pertinent surgical history.  There were no vitals filed for this visit.   Subjective Assessment - 06/18/20 0737    Subjective I was traveling for a wedding so I was in the car.  I am overall doing better. 95% overall improvement    Currently in Pain? No/denies              [facility] PT Assessment - 06/18/20 0001      Assessment   Medical Diagnosis acute Lt sided low back pain    Referring Provider (PT) Martinique, Betty, MD    Onset Date/Surgical Date 08/24/19      Prior Function   Level of Independence Independent    Vocation Full time employment      Cognition   Overall Cognitive Status Within Functional Limits for tasks assessed      Posture/Postural Control   Posture/Postural Control Postural limitations      Palpation   Palpation comment minimal trigger points in Lt gluteals                         [facility] Adult PT Treatment/Exercise - 06/18/20 0001      Manual Therapy   Manual Therapy Soft tissue mobilization    Soft tissue mobilization gluteals on Lt            Trigger Point Dry Needling -  06/18/20 0001    Consent Given? Yes    Education Handout Provided Previously provided    Muscles Treated Back/Hip Gluteus medius;Piriformis   Lt only   Gluteus Medius Response Twitch response elicited;Palpable increased muscle length    Piriformis Response Twitch response elicited;Palpable increased muscle length                  PT Short Term Goals - 05/28/20 1219      PT SHORT TERM GOAL #1   Title be independent in initial HEP    Status Achieved      PT SHORT TERM GOAL #2   Title report a 30% reduction in Lt LBP/gluteal pain with physical activity    Status Achieved             PT Long Term Goals - 06/18/20 0738      PT LONG TERM GOAL #1   Title be independent in advanced HEP    Status On-going    Target Date 06/25/20      PT LONG TERM GOAL #2   Title report a 70% reduction in Lt sided LBP  and gluteal pain with lifting and physical activity    Baseline 95%    Status On-going    Target Date 06/25/20      PT LONG TERM GOAL #3   Title deonstrate Rt=Lt single limb stability with hip extension of opposite limb (T position) x 10    Status Achieved                 Plan - 06/18/20 0800    Clinical Impression Statement Pt reports 95% overall improvement in symptoms since the start of care.  Pt has met all goals and demonstrates Rt=Lt single limb stability with movement.  Pt with very minor trigger points in Lt gluteals.  Pt with good response to dry needling and demonstrated improved tissue mobility after manual therapy.  Pt will be discharged to HEP today.    PT Frequency --    PT Duration --    PT Treatment/Interventions --    PT Next Visit Plan D/C PT to HEP    PT Home Exercise Plan Access Code: [email or web address]    Consulted and Agree with Plan of Care Patient           Patient will benefit from skilled therapeutic intervention in order to improve the following deficits and impairments:     Visit Diagnosis: Cramp and spasm - Plan: PT plan of care  cert/re-cert  Chronic left-sided low back pain without sciatica - Plan: PT plan of care cert/re-cert  Muscle weakness (generalized) - Plan: PT plan of care cert/re-cert     Problem List Patient Active Problem List   Diagnosis Date Noted  . Thyroid nodule 07/17/2019  . Eczema 07/19/2017  . Allergic rhinitis 07/19/2017   PHYSICAL THERAPY DISCHARGE SUMMARY  Visits from Start of Care: 6  Current functional level related to goals / functional outcomes: See above for current status.  95% overall improvement.     Remaining deficits: No deficits remain at this time.   Education / Equipment: HEP for LE strength, flexibility and stability Plan: Patient agrees to discharge.  Patient goals were not met. Patient is being discharged due to meeting the stated rehab goals.  ?????       Angelica Norman, PT 06/18/20 8:08 AM  [facility] [address] [place], [place], [address] Phone: [phone]   Fax:  [phone]  Name: Angelica Norman MRN: [id number] Date of Birth: December 28, 1990

## 2020-07-22 ENCOUNTER — Other Ambulatory Visit: Payer: Self-pay

## 2020-07-22 ENCOUNTER — Encounter: Payer: Self-pay | Admitting: Family Medicine

## 2020-07-22 ENCOUNTER — Ambulatory Visit (INDEPENDENT_AMBULATORY_CARE_PROVIDER_SITE_OTHER): Payer: No Typology Code available for payment source | Admitting: Family Medicine

## 2020-07-22 VITALS — BP 120/70 | HR 98 | Temp 98.1°F | Resp 12 | Ht 64.5 in | Wt 141.1 lb

## 2020-07-22 DIAGNOSIS — Z13228 Encounter for screening for other metabolic disorders: Secondary | ICD-10-CM

## 2020-07-22 DIAGNOSIS — Z1329 Encounter for screening for other suspected endocrine disorder: Secondary | ICD-10-CM

## 2020-07-22 DIAGNOSIS — M25561 Pain in right knee: Secondary | ICD-10-CM | POA: Diagnosis not present

## 2020-07-22 DIAGNOSIS — E041 Nontoxic single thyroid nodule: Secondary | ICD-10-CM

## 2020-07-22 DIAGNOSIS — Z0000 Encounter for general adult medical examination without abnormal findings: Secondary | ICD-10-CM | POA: Diagnosis not present

## 2020-07-22 DIAGNOSIS — Z13220 Encounter for screening for lipoid disorders: Secondary | ICD-10-CM | POA: Diagnosis not present

## 2020-07-22 DIAGNOSIS — Z130 Encounter for screening for diseases of the blood and blood-forming organs and certain disorders involving the immune mechanism: Secondary | ICD-10-CM

## 2020-07-22 NOTE — Progress Notes (Signed)
HPI: [name] is a 29 y.o. female, who is here today for her routine physical.  Last CPE: 07/13/2018.  Regular exercise 3 or more time per week: She also on weight lifting at least 4 times per week. Following a healthy diet: Yes She lives with her fianc.  Chronic medical problems: Eczema, thyroid nodule, and allergy rhinitis.  Pap smear: 08/10/2018. She follows with gynecologist annually.  There is no immunization history on file for this patient.  Hep C screening: 08/10/2018 NR  Concerns today:  Right knee pain with extension and internal rotation. No hx of recent injuries. No edema or erythema. No limitation of ROM. She has had similar problems in the past when she was swimming, resolved with PT.  Thyroid nodule: Thyroid US 07/2018:Bilateral thyroid cysts and nodules which are either sonographically benign or do not meet criteria for further evaluation.  Of note, a slightly exophytic TI-RADS category 2 spongiform nodule in the medial aspect of the right upper gland could potentially be palpable on physical exam given its superficial location. The thyroid gland itself is normal in size.  Lab Results  Component Value Date   TSH 1.49 07/17/2019   Review of Systems  Constitutional: Negative for appetite change, fatigue and fever.  HENT: Negative for dental problem, hearing loss, mouth sores and sore throat.   Eyes: Negative for redness and visual disturbance.  Respiratory: Negative for cough, shortness of breath and wheezing.   Cardiovascular: Negative for chest pain and leg swelling.  Gastrointestinal: Negative for abdominal pain, nausea and vomiting.       No changes in bowel habits.  Endocrine: Negative for cold intolerance, heat intolerance, polydipsia, polyphagia and polyuria.  Genitourinary: Negative for decreased urine volume, dysuria, hematuria, vaginal bleeding and vaginal discharge.  Musculoskeletal: Negative for gait problem and myalgias.    Skin: Negative for color change and rash.  Allergic/Immunologic: Positive for environmental allergies.  Neurological: Negative for syncope, weakness and headaches.  Hematological: Negative for adenopathy. Does not bruise/bleed easily.  Psychiatric/Behavioral: Negative for behavioral problems and confusion.  All other systems reviewed and are negative.  Current Outpatient Medications on File Prior to Visit  Medication Sig Dispense Refill  . cetirizine (ZYRTEC) 10 MG tablet Take 10 mg by mouth daily.    . fluticasone (FLONASE) 50 MCG/ACT nasal spray USE 1 SPRAY INTO EACH NOSTRIL DAILY. 16 g 2  . levonorgestrel (MIRENA) 20 MCG/24HR IUD 1 each by Intrauterine route once.    . montelukast (SINGULAIR) 10 MG tablet Take 1 tablet (10 mg total) by mouth at bedtime. 90 tablet 3  . triamcinolone cream (KENALOG) 0.1 % Apply 1 application topically daily as needed. 45 g 2   No current facility-administered medications on file prior to visit.   Past Medical History:  Diagnosis Date  . Abnormal Pap smear of cervix 07/2015   Neg/Pos HR HPV--in [place]--no colpo/no treatment  . Allergy   . Eczema   . Thyroid nodule 07/2018   History reviewed. No pertinent surgical history.  No Known Allergies  Family History  Problem Relation Age of Onset  . Arthritis Mother   . Hyperlipidemia Mother   . Diabetes Father   . Cancer Father        prostate  . Thyroid disease Father        hypothyroid  . Heart disease Maternal Grandmother   . Hyperlipidemia Maternal Grandmother   . Stroke Maternal Grandmother   . Stroke Paternal Grandmother   . Hypertension Paternal Grandmother   .  Cancer Paternal Grandfather 80       Dec colon cancer    Social History   Socioeconomic History  . Marital status: Single    Spouse name: Not on file  . Number of children: Not on file  . Years of education: Not on file  . Highest education level: Not on file  Occupational History  . Not on file  Tobacco Use  .  Smoking status: Never Smoker  . Smokeless tobacco: Never Used  Vaping Use  . Vaping Use: Never used  Substance and Sexual Activity  . Alcohol use: Yes    Alcohol/week: 3.0 standard drinks    Types: 3 Cans of beer per week  . Drug use: No  . Sexual activity: Yes    Partners: Male    Birth control/protection: Implant    Comment: Nexplanon inserted 10-26-14/Lt.arm  Other Topics Concern  . Not on file  Social History Narrative  . Not on file   Social Determinants of Health   Financial Resource Strain:   . Difficulty of Paying Living Expenses:   Food Insecurity:   . Worried About Programme researcher, broadcasting/film/video in the Last Year:   . Barista in the Last Year:   Transportation Needs:   . Freight forwarder (Medical):   Marland Kitchen Lack of Transportation (Non-Medical):   Physical Activity:   . Days of Exercise per Week:   . Minutes of Exercise per Session:   Stress:   . Feeling of Stress :   Social Connections:   . Frequency of Communication with Friends and Family:   . Frequency of Social Gatherings with Friends and Family:   . Attends Religious Services:   . Active Member of Clubs or Organizations:   . Attends Banker Meetings:   Marland Kitchen Marital Status:    Vitals:   07/22/20 0802  BP: 120/70  Pulse: 98  Resp: 12  Temp: 98.1 F (36.7 C)  SpO2: 99%   Body mass index is 23.85 kg/m.  Wt Readings from Last 3 Encounters:  07/22/20 141 lb 2 oz (64 kg)  04/27/20 147 lb (66.7 kg)  04/15/20 151 lb 6 oz (68.7 kg)   Physical Exam Vitals and nursing note reviewed.  Constitutional:      General: She is not in acute distress.    Appearance: She is well-developed and normal weight.  HENT:     Head: Normocephalic and atraumatic.     Right Ear: Hearing, ear canal and external ear normal. Tympanic membrane is scarred.     Left Ear: Hearing, ear canal and external ear normal. Tympanic membrane is scarred.     Mouth/Throat:     Mouth: Mucous membranes are moist.     Pharynx:  Oropharynx is clear. Uvula midline.  Eyes:     Extraocular Movements: Extraocular movements intact.     Conjunctiva/sclera: Conjunctivae normal.     Pupils: Pupils are equal, round, and reactive to light.  Neck:     Thyroid: No thyroid tenderness.     Trachea: No tracheal deviation.  Cardiovascular:     Rate and Rhythm: Normal rate and regular rhythm.     Pulses:          Posterior tibial pulses are 2+ on the right side and 2+ on the left side.     Heart sounds: No murmur heard.   Pulmonary:     Effort: Pulmonary effort is normal. No respiratory distress.     Breath sounds:  Normal breath sounds.  Abdominal:     Palpations: Abdomen is soft. There is no hepatomegaly or mass.     Tenderness: There is no abdominal tenderness.  Genitourinary:    Comments: Deferred to gyn. Musculoskeletal:     Right knee: No deformity, effusion, erythema or bony tenderness. Normal range of motion. No tenderness. Normal alignment.     Instability Tests: Anterior drawer test negative. Posterior drawer test negative.     Comments: No major deformity or signs of synovitis appreciated.  Lymphadenopathy:     Cervical: No cervical adenopathy.     Upper Body:     Right upper body: No supraclavicular adenopathy.     Left upper body: No supraclavicular adenopathy.  Skin:    General: Skin is warm.     Findings: No erythema or rash.  Neurological:     General: No focal deficit present.     Mental Status: She is alert and oriented to person, place, and time.     Cranial Nerves: No cranial nerve deficit.     Sensory: Sensation is intact.     Coordination: Coordination normal.     Gait: Gait normal.     Deep Tendon Reflexes:     Reflex Scores:      Bicep reflexes are 2+ on the right side and 2+ on the left side.      Patellar reflexes are 2+ on the right side and 2+ on the left side. Psychiatric:        Mood and Affect: Mood and affect normal.     Comments: Well groomed, good eye contact.   ASSESSMENT AND  PLAN:  Angelica Norman was here today annual physical examination.  Orders Placed This Encounter  Procedures  . Basic metabolic panel  . Lipid panel  . TSH  . Ambulatory referral to Physical Therapy   Lab Results  Component Value Date   CREATININE 0.87 07/23/2020   BUN 17 07/23/2020   NA 139 07/23/2020   K 4.8 07/23/2020   CL 103 07/23/2020   CO2 30 07/23/2020   Lab Results  Component Value Date   CHOL 170 07/23/2020   HDL 57 07/23/2020   LDLCALC 99 07/23/2020   TRIG 56 07/23/2020   CHOLHDL 3.0 07/23/2020   Lab Results  Component Value Date   TSH 1.78 07/23/2020    Routine general medical examination at a health care facility We discussed the importance of regular physical activity and healthy diet for prevention of chronic illness and/or complications. Preventive guidelines reviewed. Continue to follow with gynecology for her female preventive care. Vaccination up-to-date.  Next CPE in a year.  Right knee pain, unspecified chronicity Knee examination today is normal. PT will be arranged.  Thyroid nodule Further recommendation will be given according to TSH results.  Screening for lipoid disorders -     Lipid panel; Future  Screening for endocrine, metabolic and immunity disorder -     Basic metabolic panel; Future  Return in 1 year (on 07/22/2021) for Labs tomorrow..   Thijs Brunton G. [place], MD  [facility]. [facility]. Today you have you routine preventive visit.  A few things to remember from today's visit:  Routine general medical examination at a health care facility  Right knee pain, unspecified chronicity - Plan: Ambulatory referral to Physical Therapy  Thyroid nodule - Plan: TSH  Screening for lipoid disorders - Plan: Lipid panel  Screening for endocrine, metabolic and immunity disorder - Plan: Basic metabolic panel  Please be sure medication list is accurate. If a new problem present, please set up  appointment sooner than planned today.  At least 150 minutes of moderate exercise per week, daily brisk walking for 15-30 min is a good exercise option. Healthy diet low in saturated (animal) fats and sweets and consisting of fresh fruits and vegetables, lean meats such as fish and white chicken and whole grains.  These are some of recommendations for screening depending of age and risk factors:  - Vaccines:  Tdap vaccine every 10 years.  Shingles vaccine recommended at age 63, could be given after 27 years of age but not sure about insurance coverage.   Pneumonia vaccines: Pneumovax at 65. Sometimes Pneumovax is giving earlier if history of smoking, lung disease,diabetes,kidney disease among some.  Screening for diabetes at age 25 and every 3 years.  Cervical cancer prevention:  Pap smear starts at 27 years of age and continues periodically until 27 years old in low risk women. Pap smear every 3 years between 67 and 35 years old. Pap smear every 3-5 years between women 30 and older if pap smear negative and HPV screening negative.   -Breast cancer: Mammogram: There is disagreement between experts about when to start screening in low risk asymptomatic female but recent recommendations are to start screening at 67 and not later than 27 years old , every 1-2 years and after 27 yo q 2 years. Screening is recommended until 27 years old but some women can continue screening depending of healthy issues.  Colon cancer screening: Has been recently changed to 27 yo. Insurance may not cover until you are 27 years old. Screening is recommended until 27 years old.  Cholesterol disorder screening at age 30 and every 3 years.  Also recommended:  1. Dental visit- Brush and floss your teeth twice daily; visit your dentist twice a year. 2. Eye doctor- Get an eye exam at least every 2 years. 3. Helmet use- Always wear a helmet when riding a bicycle, motorcycle, rollerblading or skateboarding. 4. Safe  sex- If you may be exposed to sexually transmitted infections, use a condom. 5. Seat belts- Seat belts can save your live; always wear one. 6. Smoke/Carbon Monoxide detectors- These detectors need to be installed on the appropriate level of your home. Replace batteries at least once a year. 7. Skin cancer- When out in the sun please cover up and use sunscreen 15 SPF or higher. 8. Violence- If anyone is threatening or hurting you, please tell your healthcare provider.  9. Drink alcohol in moderation- Limit alcohol intake to one drink or less per day. Never drink and drive. 10. Calcium supplementation 1000 to 1200 mg daily, ideally through your diet.  Vitamin D supplementation 800 units daily.

## 2020-07-22 NOTE — Patient Instructions (Addendum)
Today you have you routine preventive visit. A few things to remember from today's visit:  Routine general medical examination at a health care facility  Right knee pain, unspecified chronicity - Plan: Ambulatory referral to Physical Therapy  Thyroid nodule - Plan: TSH  Screening for lipoid disorders - Plan: Lipid panel  Screening for endocrine, metabolic and immunity disorder - Plan: Basic metabolic panel  Please be sure medication list is accurate. If a new problem present, please set up appointment sooner than planned today.  At least 150 minutes of moderate exercise per week, daily brisk walking for 15-30 min is a good exercise option. Healthy diet low in saturated (animal) fats and sweets and consisting of fresh fruits and vegetables, lean meats such as fish and white chicken and whole grains.  These are some of recommendations for screening depending of age and risk factors:  - Vaccines:  Tdap vaccine every 10 years.  Shingles vaccine recommended at age 27, could be given after 27 years of age but not sure about insurance coverage.   Pneumonia vaccines: Pneumovax at 65. Sometimes Pneumovax is giving earlier if history of smoking, lung disease,diabetes,kidney disease among some.  Screening for diabetes at age 42 and every 3 years.  Cervical cancer prevention:  Pap smear starts at 27 years of age and continues periodically until 27 years old in low risk women. Pap smear every 3 years between 46 and 54 years old. Pap smear every 3-5 years between women 30 and older if pap smear negative and HPV screening negative.   -Breast cancer: Mammogram: There is disagreement between experts about when to start screening in low risk asymptomatic female but recent recommendations are to start screening at 26 and not later than 27 years old , every 1-2 years and after 27 yo q 2 years. Screening is recommended until 27 years old but some women can continue screening depending of healthy  issues.  Colon cancer screening: Has been recently changed to 27 yo. Insurance may not cover until you are 27 years old. Screening is recommended until 27 years old.  Cholesterol disorder screening at age 76 and every 3 years.  Also recommended:  1. Dental visit- Brush and floss your teeth twice daily; visit your dentist twice a year. 2. Eye doctor- Get an eye exam at least every 2 years. 3. Helmet use- Always wear a helmet when riding a bicycle, motorcycle, rollerblading or skateboarding. 4. Safe sex- If you may be exposed to sexually transmitted infections, use a condom. 5. Seat belts- Seat belts can save your live; always wear one. 6. Smoke/Carbon Monoxide detectors- These detectors need to be installed on the appropriate level of your home. Replace batteries at least once a year. 7. Skin cancer- When out in the sun please cover up and use sunscreen 15 SPF or higher. 8. Violence- If anyone is threatening or hurting you, please tell your healthcare provider.  9. Drink alcohol in moderation- Limit alcohol intake to one drink or less per day. Never drink and drive. 10. Calcium supplementation 1000 to 1200 mg daily, ideally through your diet.  Vitamin D supplementation 800 units daily.

## 2020-07-23 ENCOUNTER — Other Ambulatory Visit (INDEPENDENT_AMBULATORY_CARE_PROVIDER_SITE_OTHER): Payer: No Typology Code available for payment source

## 2020-07-23 DIAGNOSIS — Z13220 Encounter for screening for lipoid disorders: Secondary | ICD-10-CM

## 2020-07-23 DIAGNOSIS — E041 Nontoxic single thyroid nodule: Secondary | ICD-10-CM | POA: Diagnosis not present

## 2020-07-23 DIAGNOSIS — Z130 Encounter for screening for diseases of the blood and blood-forming organs and certain disorders involving the immune mechanism: Secondary | ICD-10-CM

## 2020-07-23 DIAGNOSIS — Z13228 Encounter for screening for other metabolic disorders: Secondary | ICD-10-CM

## 2020-07-24 LAB — BASIC METABOLIC PANEL
BUN: 17 mg/dL (ref 7–25)
CO2: 30 mmol/L (ref 20–32)
Calcium: 9.9 mg/dL (ref 8.6–10.2)
Chloride: 103 mmol/L (ref 98–110)
Creat: 0.87 mg/dL (ref 0.50–1.10)
Glucose, Bld: 92 mg/dL (ref 65–99)
Potassium: 4.8 mmol/L (ref 3.5–5.3)
Sodium: 139 mmol/L (ref 135–146)

## 2020-07-24 LAB — LIPID PANEL
Cholesterol: 170 mg/dL (ref <200)
HDL: 57 mg/dL (ref >=50)
LDL Cholesterol (Calc): 99 mg/dL (calc)
Non-HDL Cholesterol (Calc): 113 mg/dL (calc) (ref <130)
Total CHOL/HDL Ratio: 3 (calc) (ref <5.0)
Triglycerides: 56 mg/dL (ref <150)

## 2020-07-24 LAB — TSH: TSH: 1.78 mIU/L

## 2020-09-03 ENCOUNTER — Encounter: Payer: Self-pay | Admitting: Physical Therapy

## 2020-09-03 ENCOUNTER — Other Ambulatory Visit: Payer: Self-pay

## 2020-09-03 ENCOUNTER — Ambulatory Visit: Payer: No Typology Code available for payment source | Admitting: Physical Therapy

## 2020-09-03 ENCOUNTER — Ambulatory Visit: Payer: No Typology Code available for payment source | Attending: Family Medicine | Admitting: Physical Therapy

## 2020-09-03 DIAGNOSIS — M25561 Pain in right knee: Secondary | ICD-10-CM | POA: Insufficient documentation

## 2020-09-03 DIAGNOSIS — G8929 Other chronic pain: Secondary | ICD-10-CM | POA: Insufficient documentation

## 2020-09-03 DIAGNOSIS — M6281 Muscle weakness (generalized): Secondary | ICD-10-CM | POA: Diagnosis present

## 2020-09-03 NOTE — Therapy (Signed)
[facility] [address] [place], [place], [address] Phone: [phone]   Fax:  [phone]  Physical Therapy Evaluation  Patient Details  Name: Angelica Norman MRN: [id number] Date of Birth: 12/16/1990 Referring Provider (PT): Dr. Betty Swaziland   Encounter Date: 09/03/2020   PT End of Session - 09/03/20 1959    Visit Number 1    Date for PT Re-Evaluation 10/29/20    Authorization Type [employer]    PT Start Time 0845    PT Stop Time 0928    PT Time Calculation (min) 43 min    Activity Tolerance Patient tolerated treatment well           Past Medical History:  Diagnosis Date  . Abnormal Pap smear of cervix 07/2015   Neg/Pos HR HPV--in [place]--no colpo/no treatment  . Allergy   . Eczema   . Thyroid nodule 07/2018    History reviewed. No pertinent surgical history.  There were no vitals filed for this visit.    Subjective Assessment - 09/03/20 0846    Subjective Was here for LBP which resolved with DN and stretching.  Lateral right knee pain sharp with extension and internal rotation.  Catch with weight bearing. 1 1/2 years.  Did power lifting competition.  No pain with steps.    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 No/denies    Pain Score 0-No pain    Pain Location Knee    Pain Orientation Right;Lateral    Pain Type Chronic pain    Pain Onset More than a month ago    Pain Frequency Intermittent    Aggravating Factors  terminal knee extension even without weight bearing; knee extension machine at the gym and leg press;  internal rotation    Pain  Relieving Factors no pain at rest              [facility] PT Assessment - 09/03/20 0001      Assessment   Medical Diagnosis right knee pain     Referring Provider (PT) Dr. Betty Swaziland    Onset Date/Surgical Date --   1 1/2 years    Next MD Visit as needed    Prior Therapy for LBP/hip      Precautions   Precautions None      Restrictions   Weight Bearing Restrictions No      Balance Screen   Has the patient fallen in the past 6 months No    Has the patient had a decrease in activity level because of a fear of falling?  No    Is the patient reluctant to leave their home because of a fear of falling?  No      Home Tourist information centre manager residence      Prior Function   Level of Independence Independent    Vocation Full time employment    Leisure power lifting, teach yoga, cooking, hang out with friends       Observation/Other Assessments   Focus on Therapeutic Outcomes (FOTO)  28% limitation  Posture/Postural Control   Posture Comments mild right increased pronation compared to left        AROM   Overall AROM Comments Able to do full squat no pain     Right Knee Extension 0    Right Knee Flexion 145    Left Knee Extension 0    Left Knee Flexion 145      Strength   Overall Strength Comments right hip abduction 4+/5; genu valgus with fatigue with 8 reps of 6 inch step downs     Right Knee Flexion 5/5    Right Knee Extension --   5-/5   Left Knee Flexion 5/5    Left Knee Extension 5/5      Flexibility   Soft Tissue Assessment /Muscle Length yes    Hamstrings WFLs      Palpation   Palpation comment mild tenderness distal ITB/lateral knee       Special Tests   Other special tests normal patellar mobility                      Objective measurements completed on examination: See above findings.               PT Education - 09/03/20 1957    Education Details discussed new shoewear, possible off shelf orthotic;  patellofemoral alignment with gym ex's;  wall abduction isometrics with and without minisquats    Person(s) Educated Patient    Methods Explanation;Demonstration;Handout    Comprehension Verbalized understanding;Returned demonstration            PT Short Term Goals - 09/03/20 2025      PT SHORT TERM GOAL #1   Title be independent in initial HEP    Time 4    Period Weeks    Status New    Target Date 10/01/20      PT SHORT TERM GOAL #2   Title report a 30% reduction in right knee pain with physical activity including knee extension    Time 4    Period Weeks    Status New             PT Long Term Goals - 09/03/20 2026      PT LONG TERM GOAL #1   Title be independent in advanced HEP    Time 8    Period Weeks    Status New    Target Date 10/29/20      PT LONG TERM GOAL #2   Title report a 60% reduction in right lateral knee pain with knee extension and rotation exercise and general  physical activity    Time 8    Period Weeks    Status New      PT LONG TERM GOAL #3   Title Right quad/patellofemoral eccentric control 5/5 or able to do 15 step downs with good PF alignment    Time 8    Period Weeks    Status New      PT LONG TERM GOAL #4   Title Right hip abduction strength 5/5 for improved mechanics and PF alignment during home, work and leisure activities    Time 8    Period Weeks    Status New      PT LONG TERM GOAL #5   Title FOTO functional outcome score improved from 28% limitation to 15%    Time 8    Period Weeks    Status New  Plan - 09/03/20 0928    Clinical Impression Statement The patient reports a year and a half history of right lateral knee pain with terminal knee extension particularly with weight bearing but also with nonweight bearing and with internal rotation.  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  She continues to work out at [employer] and train although she has some pain with the knee extension  machine and leg press.  She has full knee ROM for both flexion and extension and is able to do a full squat painfree.  She has some tenderness distal ITB and lateral knee joint line.  90 degrees bil HS length.  Knee strength grossly 5-/5 but decreased patellofemoral control (excessive hip adduction, internal rotation/genu valgus) noted with 6 inch step downs after 8 reps.  Hip abduction on right 4+/5 to 5-/5 with discrepancy compared to left.  Slightly more pronation on right compared to left.  Patient is due for new shoes which may help with the overpronation.  If not she may benefit from an orthotic.  She would benefit from PT to address these deficits.    Personal Factors and Comorbidities Time since onset of injury/illness/exacerbation    Examination-Activity Limitations Other    Examination-Participation Restrictions Other    Stability/Clinical Decision Making Stable/Uncomplicated    Clinical Decision Making Low    Rehab Potential Good    PT Frequency 1x / week    PT Duration 8 weeks    PT Treatment/Interventions ADLs/Self Care Home Management;Neuromuscular re-education;Therapeutic exercise;Therapeutic activities;Manual techniques;Taping;Dry needling;Iontophoresis 4mg /ml Dexamethasone;Electrical Stimulation    PT Next Visit Plan patellofemoral control awareness with eccentric strengthening;  intermediate level glute med strengthening (try band side step taps);  possible DN lateral knee soft tissue    PT Home Exercise Plan    Consulted and Agree with Plan of Care Patient           Patient will benefit from skilled therapeutic intervention in order to improve the following deficits and impairments:  Pain, Decreased strength, Postural dysfunction  Visit Diagnosis: Chronic pain of right knee - Plan: PT plan of care cert/re-cert  Muscle weakness (generalized) - Plan: PT plan of care cert/re-cert     Problem List Patient Active Problem List   Diagnosis Date Noted  . Thyroid  nodule 07/17/2019  . Eczema 07/19/2017  . Allergic rhinitis 07/19/2017   07/21/2017, PT 09/03/20 8:33 PM Phone: [phone] Fax: [phone] 09/03/2020, 8:32 PM  [facility] [address], [address] [place], [place], [place] Phone: [phone]   Fax:  [phone]  Name: Angelica Norman MRN: [name] Date of Birth: 03/31/1991

## 2020-09-03 NOTE — Patient Instructions (Signed)
Access Code: [email or web address] URL: [email or web address] Date: 09/03/2020 Prepared by: Lavinia Sharps  Exercises Standing Isometric Hip Abduction with Newman Pies on Wall - 1 x daily - 7 x weekly - 2 sets - 10 reps - 5 hold

## 2020-09-09 ENCOUNTER — Ambulatory Visit: Payer: No Typology Code available for payment source | Attending: Family Medicine

## 2020-09-09 ENCOUNTER — Other Ambulatory Visit: Payer: Self-pay

## 2020-09-09 DIAGNOSIS — M6281 Muscle weakness (generalized): Secondary | ICD-10-CM | POA: Diagnosis present

## 2020-09-09 DIAGNOSIS — R252 Cramp and spasm: Secondary | ICD-10-CM | POA: Diagnosis present

## 2020-09-09 DIAGNOSIS — M25561 Pain in right knee: Secondary | ICD-10-CM | POA: Diagnosis not present

## 2020-09-09 DIAGNOSIS — G8929 Other chronic pain: Secondary | ICD-10-CM | POA: Insufficient documentation

## 2020-09-09 NOTE — Therapy (Signed)
[facility] [address], [address] [place], [place], [address] Phone: [phone]   Fax:  [phone]  Physical Therapy Treatment  Patient Details  Name: Angelica Norman MRN: [id number] Date of Birth: [birth year] Referring Provider (PT): Dr. Betty Swaziland   Encounter Date: 09/09/2020   PT End of Session - 09/09/20 1233    Visit Number 2    Date for PT Re-Evaluation 10/29/20    Authorization Type [employer]    PT Start Time 1149    PT Stop Time 1231    PT Time Calculation (min) 42 min    Activity Tolerance Patient tolerated treatment well    Behavior During Therapy [facility] for tasks assessed/performed           Past Medical History:  Diagnosis Date  . Abnormal Pap smear of cervix 07/2015   Neg/Pos HR HPV--in [place]--no colpo/no treatment  . Allergy   . Eczema   . Thyroid nodule 07/2018    History reviewed. No pertinent surgical history.  There were no vitals filed for this visit.   Subjective Assessment - 09/09/20 1150    Subjective I am having lateral knee pain today.    Currently in Pain? Yes    Pain Score 3     Pain Location Knee    Pain Orientation Right;Lateral    Pain Descriptors / Indicators Sharp    Pain Type Chronic pain    Pain Onset More than a month ago    Pain Frequency Intermittent    Aggravating Factors  knee extension, walking    Pain Relieving Factors no pain at rest                             [facility] Adult PT Treatment/Exercise - 09/09/20 0001      Exercises   Exercises Knee/Hip      Knee/Hip Exercises: Standing   Step Down Both;2 sets    Other Standing Knee Exercises sidestepping with band: red loop bil.     Other Standing Knee Exercises iosmetric hip aduction on with ball: standing and in mini squat      Knee/Hip Exercises: Sidelying   Clams red loop x10 bil       Manual Therapy   Manual Therapy Myofascial release;Soft tissue mobilization    Manual  therapy comments elongation to Rt lateral quads- advised getting a roller for home tissue mobilization            Trigger Point Dry Needling - 09/09/20 0001    Consent Given? Yes    Education Handout Provided Previously provided    Muscles Treated Lower Quadrant Quadriceps;Hamstring   Rt only   Quadriceps Response Twitch response elicited;Palpable increased muscle length    Hamstring Response Twitch response elicited;Palpable increased muscle length                  PT Short Term Goals - 09/03/20 2025      PT SHORT TERM GOAL #1   Title be independent in initial HEP    Time 4    Period Weeks    Status New    Target Date 10/01/20      PT SHORT TERM GOAL #2   Title report a 30% reduction in right knee pain with physical activity including knee extension    Time 4    Period Weeks    Status New  PT Long Term Goals - 09/03/20 2026      PT LONG TERM GOAL #1   Title be independent in advanced HEP    Time 8    Period Weeks    Status New    Target Date 10/29/20      PT LONG TERM GOAL #2   Title report a 60% reduction in right lateral knee pain with knee extension and rotation exercise and general  physical activity    Time 8    Period Weeks    Status New      PT LONG TERM GOAL #3   Title Right quad/patellofemoral eccentric control 5/5 or able to do 15 step downs with good PF alignment    Time 8    Period Weeks    Status New      PT LONG TERM GOAL #4   Title Right hip abduction strength 5/5 for improved mechanics and PF alignment during home, work and leisure activities    Time 8    Period Weeks    Status New      PT LONG TERM GOAL #5   Title FOTO functional outcome score improved from 28% limitation to 15%    Time 8    Period Weeks    Status New                 Plan - 09/09/20 1209    Clinical Impression Statement Pt with first time follow-up after evaluation.  Pt did have some Rt lateral knee pain after the evaluation indicating  that fatigue of quads plays a role in her pain.  Pt performed all HEP well today.  PT added sidestepping with a band and step-downs to work on stability in neutral and lateral hip strength. Pt demonstrated fatigue with single leg squat on 2nd set.   Pt with trigger points in lateral Rt quads and hamstrings and demonstrated improved tissue mobility after manual therapy today.  Pt will continue to benefit from skilled PT to address hip and knee stability to perform high level strength exercises without pain.    PT Frequency 1x / week    PT Duration 8 weeks    PT Treatment/Interventions ADLs/Self Care Home Management;Neuromuscular re-education;Therapeutic exercise;Therapeutic activities;Manual techniques;Taping;Dry needling;Iontophoresis 4mg /ml Dexamethasone;Electrical Stimulation    PT Next Visit Plan assess response to dry needling, continue strength and stability    PT Home Exercise Plan           Patient will benefit from skilled therapeutic intervention in order to improve the following deficits and impairments:  Pain, Decreased strength, Postural dysfunction  Visit Diagnosis: Chronic pain of right knee  Muscle weakness (generalized)  Cramp and spasm     Problem List Patient Active Problem List   Diagnosis Date Noted  . Thyroid nodule 07/17/2019  . Eczema 07/19/2017  . Allergic rhinitis 07/19/2017    07/21/2017, PT 09/09/20 12:38 PM  [facility] [address] [place], [place], [place] Phone: [phone]   Fax:  [phone]  Name: Raney Antwine MRN: [name] Date of Birth: 25-Feb-1991

## 2020-09-19 ENCOUNTER — Telehealth (INDEPENDENT_AMBULATORY_CARE_PROVIDER_SITE_OTHER): Payer: No Typology Code available for payment source | Admitting: Family Medicine

## 2020-09-19 ENCOUNTER — Other Ambulatory Visit: Payer: Self-pay

## 2020-09-19 DIAGNOSIS — L989 Disorder of the skin and subcutaneous tissue, unspecified: Secondary | ICD-10-CM | POA: Diagnosis not present

## 2020-09-19 MED ORDER — DOXYCYCLINE HYCLATE 100 MG PO TABS
100.0000 mg | ORAL_TABLET | Freq: Two times a day (BID) | ORAL | 0 refills | Status: DC
Start: 2020-09-19 — End: 2021-06-21

## 2020-09-19 NOTE — Patient Instructions (Addendum)
-  I sent the medication(s) we discussed to your pharmacy: Meds ordered this encounter  Medications  . doxycycline (VIBRA-TABS) 100 MG tablet    Sig: Take 1 tablet (100 mg total) by mouth 2 (two) times daily.    Dispense:  10 tablet    Refill:  0     I hope you are feeling better soon! Seek in person care promptly if your symptoms worsen, new concerns arise or you are not improving with treatment.  Please note: I help [employer] out with their telemedicine visits on Tuesdays and Thursdays. However, I am not logged into their system on other days. If you have further questions or concerns following this visit, please schedule a follow-up visit with your primary care doctor or with another care facility.

## 2020-09-19 NOTE — Progress Notes (Addendum)
Virtual Visit via Video Note  I connected with Sherell  on 09/19/20 at  4:20 PM EDT by a video enabled telemedicine application and verified that I am speaking with the correct person using two identifiers.  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 The patient expressed understanding and agreed to proceed.   HPI:  Acute telemedicine visit for "insect bite": -Onset: started after sitting outside at a [place] several days ago, thought was a mosquito bite -Symptoms include: however, developed red swollen area on the R thigh, was a little tender and some surrounding erythema -she is leaving to go out of town and thought better get checked out for possible abx in case of celluitis -Denies: stinger, oozing, purulence, other lesions, fevers, malaise -Has tried: a topical steroid once -Pertinent medication allergies: nkda -TD on tetanus -denies any chance of pregnancy -no history of liver disease  ROS: See pertinent positives and negatives per HPI.  Past Medical History:  Diagnosis Date  . Abnormal Pap smear of cervix 07/2015   Neg/Pos HR HPV--in [place]--no colpo/no treatment  . Allergy   . Eczema   . Thyroid nodule 07/2018    No past surgical history on file.   Current Outpatient Medications:  .  cetirizine (ZYRTEC) 10 MG tablet, Take 10 mg by mouth daily., Disp: , Rfl:  .  doxycycline (VIBRA-TABS) 100 MG tablet, Take 1 tablet (100 mg total) by mouth 2 (two) times daily., Disp: 10 tablet, Rfl: 0 .  fluticasone (FLONASE) 50 MCG/ACT nasal spray, USE 1 SPRAY INTO EACH NOSTRIL DAILY., Disp: 16 g, Rfl: 2 .  levonorgestrel (MIRENA) 20 MCG/24HR IUD, 1 each by Intrauterine route once., Disp: , Rfl:  .  montelukast (SINGULAIR) 10 MG tablet, Take 1 tablet (10 mg total) by mouth at  bedtime., Disp: 90 tablet, Rfl: 3 .  triamcinolone cream (KENALOG) 0.1 %, Apply 1 application topically daily as needed., Disp: 45 g, Rfl: 2  EXAM:  VITALS per patient if applicable:  GENERAL: alert, oriented, appears well and in no acute distress  HEENT: atraumatic, conjunttiva clear, no obvious abnormalities on inspection of external nose and ears  NECK: normal movements of the head and neck  LUNGS: on inspection no signs of respiratory distress, breathing rate appears normal, no obvious gross SOB, gasping or wheezing  CV: no obvious cyanosis  SKIN: erythematous papule on the thigh in question with some surrounding erythema in area ~ 4 cm in diameter on video visit assessment, no purulence appreciated, 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 Pt prefers to treat via telemedicine empirically rather than in person at this moment. Query possible mild cellulitis secondary to an insect bite versus other. She opted for empiric treatment with doxycycline 100 mg twice daily for 5 days. She agrees to monitor the area and seek in person care if any worsening, new symptoms or is not improving with treatment over the next few days.  Advised to seek  prompt follow up telemedicine visit or in person care if worsening, new symptoms arise, or if is not improving with treatment. Did let this patient know that I only do telemedicine on Tuesdays and Thursdays for [facility]. Advised to schedule follow up visit with PCP or [facility] if  any further questions or concerns to avoid delays in care.   I discussed the assessment and treatment plan with the patient. The patient was provided an opportunity to ask questions and all were answered. The patient agreed with the plan and demonstrated an understanding of the instructions.     Terressa Koyanagi, DO

## 2020-09-24 ENCOUNTER — Other Ambulatory Visit: Payer: Self-pay

## 2020-09-24 ENCOUNTER — Ambulatory Visit: Payer: No Typology Code available for payment source

## 2020-09-24 DIAGNOSIS — R252 Cramp and spasm: Secondary | ICD-10-CM

## 2020-09-24 DIAGNOSIS — G8929 Other chronic pain: Secondary | ICD-10-CM

## 2020-09-24 DIAGNOSIS — M6281 Muscle weakness (generalized): Secondary | ICD-10-CM

## 2020-09-24 DIAGNOSIS — M25561 Pain in right knee: Secondary | ICD-10-CM

## 2020-09-24 NOTE — Therapy (Signed)
[facility] [address] [place], [place], [address] Phone: [phone]   Fax:  [phone]  Physical Therapy Treatment  Patient Details  Name: Janis Sol MRN: [id number] Date of Birth: 06-23-1991 Referring Provider (PT): Dr. Betty Swaziland   Encounter Date: 09/24/2020   PT End of Session - 09/24/20 1223    Visit Number 3    Date for PT Re-Evaluation 10/29/20    Authorization Type [employer]    PT Start Time 1149    PT Stop Time 1222    PT Time Calculation (min) 33 min    Activity Tolerance Patient tolerated treatment well    Behavior During Therapy [facility] for tasks assessed/performed           Past Medical History:  Diagnosis Date  . Abnormal Pap smear of cervix 07/2015   Neg/Pos HR HPV--in [place]--no colpo/no treatment  . Allergy   . Eczema   . Thyroid nodule 07/2018    History reviewed. No pertinent surgical history.  There were no vitals filed for this visit.   Subjective Assessment - 09/24/20 1148    Subjective I did good after needling last time.  Overall, my Rt LE pain is less  My knee feels tight with squats after driving to [place] last weeekend.                             [facility] Adult PT Treatment/Exercise - 09/24/20 0001      Knee/Hip Exercises: Stretches   Hip Flexor Stretch Right;2 reps;20 seconds    Hip Flexor Stretch Limitations edge of mat with knee flexion      Knee/Hip Exercises: Aerobic   Elliptical Level 4x 5 minutes      Knee/Hip Exercises: Standing   Step Down Both;2 sets;10 reps;Step Height: 6"      Manual Therapy   Manual Therapy Myofascial release;Soft tissue mobilization    Manual therapy comments elongation to Rt lateral quads and hamstrings            Trigger Point Dry Needling - 09/24/20 0001    Consent Given? Yes    Education Handout Provided Previously provided    Muscles Treated Lower Quadrant Quadriceps;Hamstring   Rt only     Quadriceps Response Twitch response elicited;Palpable increased muscle length    Hamstring Response Twitch response elicited;Palpable increased muscle length                  PT Short Term Goals - 09/24/20 1151      PT SHORT TERM GOAL #1   Title be independent in initial HEP    Status Achieved      PT SHORT TERM GOAL #2   Title report a 30% reduction in right knee pain with physical activity including knee extension             PT Long Term Goals - 09/03/20 2026      PT LONG TERM GOAL #1   Title be independent in advanced HEP    Time 8    Period Weeks    Status New    Target Date 10/29/20      PT LONG TERM GOAL #2   Title report a 60% reduction in right lateral knee pain with knee extension and rotation exercise and general  physical activity    Time 8    Period Weeks    Status New  PT LONG TERM GOAL #3   Title Right quad/patellofemoral eccentric control 5/5 or able to do 15 step downs with good PF alignment    Time 8    Period Weeks    Status New      PT LONG TERM GOAL #4   Title Right hip abduction strength 5/5 for improved mechanics and PF alignment during home, work and leisure activities    Time 8    Period Weeks    Status New      PT LONG TERM GOAL #5   Title FOTO functional outcome score improved from 28% limitation to 15%    Time 8    Period Weeks    Status New                 Plan - 09/24/20 1225    Clinical Impression Statement Pt is working to maintain neutral hip position with single leg squats.  Pt is more unstable with the first set and this improved with more reps.  Pt reports anterior Rt knee pain with squats and PT added a hip flexor stretch with knee flexion to improve length of rectus muscle.    Pt with trigger points in lateral Rt quads and hamstrings although fewer than last session. and demonstrated improved tissue mobility after manual therapy today.  Pt will continue to benefit from skilled PT to address hip and knee  stability to perform high level strength exercises without pain.    PT Frequency 1x / week    PT Duration 8 weeks    PT Treatment/Interventions ADLs/Self Care Home Management;Neuromuscular re-education;Therapeutic exercise;Therapeutic activities;Manual techniques;Taping;Dry needling;Iontophoresis 4mg /ml Dexamethasone;Electrical Stimulation    PT Next Visit Plan assess response to dry needling, continue strength and stability    PT Home Exercise Plan    Consulted and Agree with Plan of Care Patient           Patient will benefit from skilled therapeutic intervention in order to improve the following deficits and impairments:  Pain, Decreased strength, Postural dysfunction  Visit Diagnosis: Muscle weakness (generalized)  Cramp and spasm  Chronic pain of right knee     Problem List Patient Active Problem List   Diagnosis Date Noted  . Thyroid nodule 07/17/2019  . Eczema 07/19/2017  . Allergic rhinitis 07/19/2017     07/21/2017, PT 09/24/20 12:26 PM  [facility] [address] [place], [place], [place] Phone: [phone]   Fax:  [phone]  Name: Bleu Moisan MRN: [name] Date of Birth: Oct 15, 1991

## 2020-10-01 ENCOUNTER — Other Ambulatory Visit: Payer: Self-pay

## 2020-10-01 ENCOUNTER — Ambulatory Visit: Payer: No Typology Code available for payment source

## 2020-10-01 DIAGNOSIS — M6281 Muscle weakness (generalized): Secondary | ICD-10-CM

## 2020-10-01 DIAGNOSIS — R252 Cramp and spasm: Secondary | ICD-10-CM

## 2020-10-01 DIAGNOSIS — M25561 Pain in right knee: Secondary | ICD-10-CM | POA: Diagnosis not present

## 2020-10-01 DIAGNOSIS — G8929 Other chronic pain: Secondary | ICD-10-CM

## 2020-10-01 NOTE — Therapy (Signed)
[facility] [address] [place], [place], [address] Phone: [phone]   Fax:  [phone]  Physical Therapy Treatment  Patient Details  Name: Angelica [facility] MRN: [id number] Date of Birth: 10-Oct-1991 Referring Provider (PT): Dr. Betty Swaziland   Encounter Date: 10/01/2020   PT End of Session - 10/01/20 1138    Visit Number 4    Date for PT Re-Evaluation 10/29/20    Authorization Type [employer]    PT Start Time 1102    PT Stop Time 1131    PT Time Calculation (min) 29 min    Activity Tolerance Patient tolerated treatment well    Behavior During Therapy Angelica [facility] for tasks assessed/performed           Past Medical History:  Diagnosis Date  . Abnormal Pap smear of cervix 07/2015   Neg/Pos HR HPV--in [place]--no colpo/no treatment  . Allergy   . Eczema   . Thyroid nodule 07/2018    History reviewed. No pertinent surgical history.  There were no vitals filed for this visit.   Subjective Assessment - 10/01/20 1103    Subjective I haven't had any Rt knee/leg pain with leg extensions x 2 weeks .    Currently in Pain? No/denies                             [facility] Adult PT Treatment/Exercise - 10/01/20 0001      Knee/Hip Exercises: Standing   Step Down Both;2 sets;10 reps;Step Height: 6"    Step Down Limitations good alignment    Other Standing Knee Exercises T with rt and Lt 2x10      Manual Therapy   Manual Therapy Myofascial release;Soft tissue mobilization    Manual therapy comments elongation to Rt lateral quads and hamstrings            Trigger Point Dry Needling - 10/01/20 0001    Consent Given? Yes    Education Handout Provided Previously provided    Muscles Treated Lower Quadrant Quadriceps;Hamstring   Rt only   Quadriceps Response Twitch response elicited;Palpable increased muscle length    Hamstring Response Twitch response elicited;Palpable increased muscle  length                  PT Short Term Goals - 09/24/20 1151      PT SHORT TERM GOAL #1   Title be independent in initial HEP    Status Achieved      PT SHORT TERM GOAL #2   Title report a 30% reduction in right knee pain with physical activity including knee extension             PT Long Term Goals - 10/01/20 1107      PT LONG TERM GOAL #1   Title be independent in advanced HEP      PT LONG TERM GOAL #2   Title report a 60% reduction in right lateral knee pain with knee extension and rotation exercise and general  physical activity    Baseline --    Status On-going      PT LONG TERM GOAL #3   Title Right quad/patellofemoral eccentric control 5/5 or able to do 15 step downs with good PF alignment    Status Achieved                 Plan - 10/01/20 1137    Clinical Impression Statement Pt  denies any Rt LE pain with knee extension or squats x 2 weeks.  Pt demonstrated neutral knee and hip alignment with step downs x20 today.  PT advised pt to perform standing "T" exercise as a part of daily program.  Pt with improved tissue mobility in Rt lateral quads and no tenderness at patellar tendon.  Pt demonstrated improved tissue mobility after manual therapy today.  Pt will attend 1 session for needling and to finalize HEP next session    Rehab Potential Good    PT Frequency 1x / week    PT Duration 8 weeks    PT Treatment/Interventions ADLs/Self Care Home Management;Neuromuscular re-education;Therapeutic exercise;Therapeutic activities;Manual techniques;Taping;Dry needling;Iontophoresis 4mg /ml Dexamethasone;Electrical Stimulation    PT Next Visit Plan 1 more session for dry needling and to finalize HEP    PT Home Exercise Plan    Consulted and Agree with Plan of Care Patient           Patient will benefit from skilled therapeutic intervention in order to improve the following deficits and impairments:  Pain, Decreased strength, Postural  dysfunction  Visit Diagnosis: Muscle weakness (generalized)  Cramp and spasm  Chronic pain of right knee     Problem List Patient Active Problem List   Diagnosis Date Noted  . Thyroid nodule 07/17/2019  . Eczema 07/19/2017  . Allergic rhinitis 07/19/2017     07/21/2017, PT 10/01/20 11:41 AM  [facility] [address], [address] [place], [place], [place] Phone: [phone]   Fax:  [phone]  Name: Angelica [facility] MRN: [name] Date of Birth: 04/06/1991

## 2020-10-08 ENCOUNTER — Other Ambulatory Visit: Payer: Self-pay

## 2020-10-08 ENCOUNTER — Ambulatory Visit: Payer: No Typology Code available for payment source | Attending: Family Medicine | Admitting: Physical Therapy

## 2020-10-08 DIAGNOSIS — M6281 Muscle weakness (generalized): Secondary | ICD-10-CM | POA: Insufficient documentation

## 2020-10-08 DIAGNOSIS — R252 Cramp and spasm: Secondary | ICD-10-CM | POA: Insufficient documentation

## 2020-10-08 DIAGNOSIS — M25561 Pain in right knee: Secondary | ICD-10-CM | POA: Insufficient documentation

## 2020-10-08 DIAGNOSIS — G8929 Other chronic pain: Secondary | ICD-10-CM | POA: Insufficient documentation

## 2020-10-08 NOTE — Therapy (Signed)
[facility] [address], [place], [place], [address] Phone: [phone]   Fax:  [phone]  Physical Therapy Treatment/Discharge Summary   Patient Details  Name: Angelica [facility] MRN: [id number] Date of Birth: Oct 02, 1991 Referring Provider (PT): Dr. Betty Martinique   Encounter Date: 10/08/2020   PT End of Session - 10/08/20 0831    Visit Number 5    Date for PT Re-Evaluation 10/29/20    Authorization Type [employer]    PT Start Time 0802    PT Stop Time 0827   DN; discharge visit   PT Time Calculation (min) 25 min    Activity Tolerance Patient tolerated treatment well           Past Medical History:  Diagnosis Date   Abnormal Pap smear of cervix 07/2015   Neg/Pos HR HPV--in [place]--no colpo/no treatment   Allergy    Eczema    Thyroid nodule 07/2018    No past surgical history on file.  There were no vitals filed for this visit.   Subjective Assessment - 10/08/20 0804    Subjective No pain in the last few weeks.    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;  rower in college;  getting married in November going to [place]              [facility] PT Assessment - 10/08/20 0001      Observation/Other Assessments   Focus on Therapeutic Outcomes (FOTO)  1%       Strength   Overall Strength Comments right hip abduction 5/5;  15x 6 inch step downs with excellent form     Right Knee Flexion 5/5    Right Knee Extension 5/5    Left Knee Flexion 5/5    Left Knee Extension 5/5                         [facility] Adult PT Treatment/Exercise - 10/08/20 0001      Knee/Hip Exercises: Standing   Step Down Right;1 set;15 reps;Hand Hold: 0;Step Height: 6"    Other Standing Knee Exercises verbal review of HEP       Manual Therapy   Manual Therapy Myofascial release;Soft tissue  mobilization    Manual therapy comments elongation to Rt lateral quads            Trigger Point Dry Needling - 10/08/20 0001    Consent Given? Yes    Education Handout Provided Previously provided    Quadriceps Response Twitch response elicited;Palpable increased muscle length                  PT Short Term Goals - 10/08/20 0859      PT SHORT TERM GOAL #1   Title be independent in initial HEP    Status Achieved             PT Long Term Goals - 10/08/20 3299      PT LONG TERM GOAL #1   Title be independent in advanced HEP    Status Achieved      PT LONG TERM GOAL #2   Title report a 60% reduction in right lateral knee pain with knee extension and rotation exercise and general  physical activity    Status Achieved      PT LONG TERM GOAL #  3   Title Right quad/patellofemoral eccentric control 5/5 or able to do 15 step downs with good PF alignment    Status Achieved      PT LONG TERM GOAL #4   Title Right hip abduction strength 5/5 for improved mechanics and PF alignment during home, work and leisure activities    Status Achieved      PT LONG TERM GOAL #5   Title FOTO functional outcome score improved from 28% limitation to 15%    Status Achieved                 Plan - 10/08/20 0806    Clinical Impression Statement The patient reports no knee pain in the last few weeks.   5/5 strength right quads and gluteals.  No compensatory strategies with 6 inch step down test.  She demonstrates excellent compliance with HEP and areas of focus to maintain knee health.  She is able to continue with power lifting, crossfit and yoga without difficulty.  She feels ready to handle hiking and outdoor adventures on her upcoming trip.  Her FOTO functional outcome score is 1%.  She has met all rehab goals and is ready for discharge from PT at this time.    PT Home Exercise Plan [email or web address]           Patient will benefit from skilled therapeutic intervention in order to  improve the following deficits and impairments:     Visit Diagnosis: Muscle weakness (generalized)  Cramp and spasm  Chronic pain of right knee    PHYSICAL THERAPY DISCHARGE SUMMARY  Visits from Start of Care: 5  Current functional level related to goals / functional outcomes: See clinical impressions above    Remaining deficits: None    Education / Equipment: Progressive HEP Plan: Patient agrees to discharge.  Patient goals were met. Patient is being discharged due to meeting the stated rehab goals.  ?????        Problem List Patient Active Problem List   Diagnosis Date Noted   Thyroid nodule 07/17/2019   Eczema 07/19/2017   Allergic rhinitis 07/19/2017   Ruben Im, PT 10/08/20 9:01 AM Phone: [phone] Fax: [phone] Alvera Singh 10/08/2020, 9:00 AM  [facility] [address], [place], [place], [address] Phone: [phone]   Fax:  [phone]  Name: Angelica [facility] MRN: [id number] Date of Birth: 09-15-1991

## 2021-03-17 ENCOUNTER — Encounter: Payer: Self-pay | Admitting: Family Medicine

## 2021-03-17 DIAGNOSIS — M545 Low back pain, unspecified: Secondary | ICD-10-CM

## 2021-04-14 ENCOUNTER — Ambulatory Visit: Payer: No Typology Code available for payment source | Attending: Family Medicine

## 2021-04-14 ENCOUNTER — Other Ambulatory Visit: Payer: Self-pay

## 2021-04-14 DIAGNOSIS — M6281 Muscle weakness (generalized): Secondary | ICD-10-CM | POA: Diagnosis present

## 2021-04-14 DIAGNOSIS — R252 Cramp and spasm: Secondary | ICD-10-CM | POA: Diagnosis present

## 2021-04-14 DIAGNOSIS — M545 Low back pain, unspecified: Secondary | ICD-10-CM | POA: Insufficient documentation

## 2021-04-14 NOTE — Therapy (Signed)
[facility] [address] [place], [place], [address] Phone: [phone]   Fax:  [phone]  Physical Therapy Treatment  Patient Details  Name: Angelica [facility] MRN: [id number] Date of Birth: Sep 23, 1991 Referring Provider (PT): Swazil[place], Betty, MD   Encounter Date: 04/14/2021   PT End of Session - 04/14/21 1613    Visit Number 1    Date for PT Re-Evaluation 06/09/21    Authorization Type [employer]    PT Start Time 1519    PT Stop Time 1600    PT Time Calculation (min) 41 min    Activity Tolerance Patient tolerated treatment well    Behavior During Therapy [facility] for tasks assessed/performed           Past Medical History:  Diagnosis Date  . Abnormal Pap smear of cervix 07/2015   Neg/Pos HR HPV--in [place]--no colpo/no treatment  . Allergy   . Eczema   . Thyroid nodule 07/2018    History reviewed. No pertinent surgical history.  There were no vitals filed for this visit.   Subjective Assessment - 04/14/21 1525    Subjective I started having LBP with Rt>Lt SI joint pain that began in mid to late March when I started training for a power lifting competition.  Heavier weights and lower reps.  I was not doing great with my stretching lately.    Pertinent History competative body building    Patient Stated Goals reduce LBP with daily tasks and weight training    Currently in Pain? Yes    Pain Score 5    3-4/10   Pain Location Back    Pain Orientation Right;Left    Pain Type Chronic pain    Pain Onset More than a month ago    Pain Frequency Intermittent    Aggravating Factors  worse in morning before stretching and after lifting at the gym    Pain Relieving Factors Lacrosse ball, massage gun, stretching              [facility] PT Assessment - 04/14/21 0001      Assessment   Medical Diagnosis low back pain, unspecified chronicity    Referring Provider (PT) Sw[place], Betty, MD    Onset  Date/Surgical Date 02/28/21    Next MD Visit none    Prior Therapy at this clinic for DN and strength progression      Precautions   Precautions None      Restrictions   Weight Bearing Restrictions No      Balance Screen   Has the patient fallen in the past 6 months No    Has the patient had a decrease in activity level because of a fear of falling?  No    Is the patient reluctant to leave their home because of a fear of falling?  No      Home Tourist information centre manager residence      Prior Function   Level of Independence Independent    Vocation Full time employment    Vocation Requirements OT in inpatient [facility]    Leisure weight lifting, travel,yoga      Cognition   Overall Cognitive Status Within Functional Limits for tasks assessed      Observation/Other Assessments   Focus on Therapeutic Outcomes (FOTO)  83 (goal is 89)      Posture/Postural Control   Posture/Postural Control No significant limitations      ROM / Strength  AROM / PROM / Strength AROM;Strength;PROM      AROM   Overall AROM  Within functional limits for tasks performed    Overall AROM Comments reduced segmental mobility with lumbar A/ROM in all directions.  Stiffness reported with bil sidebending      PROM   Overall PROM  Within functional limits for tasks performed      Strength   Overall Strength Within functional limits for tasks performed    Overall Strength Comments hip strength is symmetrical and pt demonstates good eccentric strength and alignment with step-downs.  Mild Lt pelvic assymmetries with Lt step down.  Pt able to correct with verbal cueing.      Palpation   Spinal mobility reduced spinal mobility T8-L5 without pain    Palpation comment trigger points and tension in bil gluteals and bil lumbar paraspinals      Transfers   Transfers Independent with all Transfers      Ambulation/Gait   Ambulation/Gait Yes    Gait Pattern Within Functional Limits                          [facility] Adult PT Treatment/Exercise - 04/14/21 0001      Manual Therapy   Manual Therapy Soft tissue mobilization    Manual therapy comments monitoring, assessment and skilled palpation during dry needling            Trigger Point Dry Needling - 04/14/21 0001    Consent Given? Yes    Education Handout Provided Previously provided    Muscles Treated Back/Hip Lumbar multifidi;Gluteus medius;Gluteus minimus;Piriformis    Gluteus Minimus Response Twitch response elicited;Palpable increased muscle length    Gluteus Medius Response Twitch response elicited;Palpable increased muscle length    Piriformis Response Twitch response elicited;Palpable increased muscle length    Lumbar multifidi Response Twitch response elicited;Palpable increased muscle length                PT Education - 04/14/21 1614    Education Details verbal review with visuals from [employer] issued last time she was treated for LBP    Person(s) Educated Patient    Methods Explanation;Handout    Comprehension Verbalized understanding            PT Short Term Goals - 04/14/21 1529      PT SHORT TERM GOAL #1   Title be independent in initial HEP    Time 4    Status New    Target Date 05/12/21      PT SHORT TERM GOAL #2   Title report a 30% reduction in frequency and intensity low back pain with daily activity    Time 4    Period Weeks    Status New    Target Date 05/12/21             PT Long Term Goals - 04/14/21 1608      PT LONG TERM GOAL #1   Title be independent in advanced HEP    Time 8    Period Weeks    Status New    Target Date 06/09/21      PT LONG TERM GOAL #2   Title report a 70% reduction in frequency and intensity of LBP with daily tasks and after weight training    Time 8    Period Weeks    Status New    Target Date 06/09/21      PT LONG TERM GOAL #  3   Title improve core strength/endurance to perform heavy lifting squats with good  alignment    Time 8    Period Weeks    Status New    Target Date 06/09/21      PT LONG TERM GOAL #4   Title improve FOTO to > or = to 89 to improve funtion    Baseline 83    Time 8    Period Weeks    Status New    Target Date 06/09/21                 Plan - 04/14/21 1558    Clinical Impression Statement Pt presents to PT with LBP and SI joint/gluteal pain that began at the end of March.  Pt is a power lifter and began higher weight/lower reps as a part of her training program and this is when her pain began.  Pt reports 3-5/10 Rt>Lt SI joint and lowe lumbar pain that is worse in the morning and after heavy lifting sessions. Pt has been treated at this clinic in the past for this pain and had good result.  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  Pt demonstrates full lumbar A/ROM with Lt quadratus/lumbar tension at end range bil sidebending.  Pt with tension and trigger points bil lumbar paraspinals and proximal gluteals and reduced segmental mobility in the lower thoracic and lumbar spine.  Pt demonstrates good hip/pelvic stability with single limb activities.  Pt reports that she does not feel like she is able to maintain good core and alignment with heavy weight squats.   Pt with level pelvis today.  Pt will benefit from skilled PT to address trigger points, and core strength/endurance.    Personal Factors and Comorbidities Comorbidity 1    Comorbidities history of LBP    Examination-Activity Limitations Squat;Lift    Examination-Participation Restrictions Community Activity    Stability/Clinical Decision Making Stable/Uncomplicated    Clinical Decision Making Low    Rehab Potential Excellent    PT Frequency 1x / week    PT Duration 8 weeks    PT Treatment/Interventions ADLs/Self Care Home Management;Cryotherapy;Electrical Stimulation;Moist Heat;Traction;Stair training;Gait training;Therapeutic activities;Therapeutic exercise;Balance  training;Neuromuscular re-education;Patient/family education;Manual techniques;Taping;Dry needling;Passive range of motion;Spinal Manipulations;Joint Manipulations    PT Next Visit Plan DN to lumbar spine (maybe add electrical stim) and gluteals.  Add core/lumbopelvic strength    PT Home Exercise Plan Access Code: [email or web address] low back    Consulted and Agree with Plan of Care Patient           Patient will benefit from skilled therapeutic intervention in order to improve the following deficits and impairments:  Impaired flexibility,Pain,Improper body mechanics,Increased muscle spasms  Visit Diagnosis: Cramp and spasm - Plan: PT plan of care cert/re-cert  Acute bilateral low back pain without sciatica - Plan: PT plan of care cert/re-cert     Problem List Patient Active Problem List   Diagnosis Date Noted  . Thyroid nodule 07/17/2019  . Eczema 07/19/2017  . Allergic rhinitis 07/19/2017   Lorrene Reid, PT 04/14/21 4:17 PM  [facility] [address] [place], [place], [address] Phone: [phone]   Fax:  [phone]  Name: Dannilynn Gallina MRN: [id number] Date of Birth: [birth year]

## 2021-04-21 ENCOUNTER — Ambulatory Visit: Payer: No Typology Code available for payment source | Admitting: Physical Therapy

## 2021-04-21 ENCOUNTER — Encounter: Payer: Self-pay | Admitting: Physical Therapy

## 2021-04-21 ENCOUNTER — Other Ambulatory Visit: Payer: Self-pay

## 2021-04-21 DIAGNOSIS — R252 Cramp and spasm: Secondary | ICD-10-CM | POA: Diagnosis not present

## 2021-04-21 DIAGNOSIS — M545 Low back pain, unspecified: Secondary | ICD-10-CM

## 2021-04-21 DIAGNOSIS — M6281 Muscle weakness (generalized): Secondary | ICD-10-CM

## 2021-04-21 NOTE — Therapy (Signed)
[facility] [address], [address] [place], [place], [address] Phone: [phone]   Fax:  [phone]  Physical Therapy Treatment  Patient Details  Name: Angelica Norman MRN: [id number] Date of Birth: [birth year] Referring Provider (PT): [place], Betty, MD   Encounter Date: 04/21/2021   PT End of Session - 04/21/21 0758    Visit Number 2    Date for PT Re-Evaluation 06/09/21    Authorization Type [facility] [facility]    PT Start Time 0758    PT Stop Time 0855    PT Time Calculation (min) 57 min    Activity Tolerance Patient tolerated treatment well    Behavior During Therapy [facility] for tasks assessed/performed           Past Medical History:  Diagnosis Date  . Abnormal Pap smear of cervix 07/2015   Neg/Pos HR HPV--in [place]--no colpo/no treatment  . Allergy   . Eczema   . Thyroid nodule 07/2018    History reviewed. No pertinent surgical history.  There were no vitals filed for this visit.   Subjective Assessment - 04/21/21 0800    Subjective I was very active yesterday - did an [employer] class, did a yoga class, ran, and played kickball.  I did heavy squatting too.  My knee held up and I felt stable.  My pain has changed from sharp and tight to just tight since the DN last time.    Pertinent History competetive body building    Patient Stated Goals reduce LBP with daily tasks and weight training    Currently in Pain? Yes    Pain Score 3     Pain Location Back    Pain Orientation Right;Left;Lower    Pain Descriptors / Indicators Tightness    Pain Type Chronic pain    Pain Onset More than a month ago    Pain Frequency Intermittent                             [facility] Adult PT Treatment/Exercise - 04/21/21 0001      Exercises   Exercises Lumbar;Knee/Hip;Shoulder      Lumbar Exercises: Stretches   Active Hamstring Stretch Left;Right;30 seconds    Active Hamstring Stretch Limitations  seated    Other Lumbar Stretch Exercise straddle forward fold x 10 sec, add thoracic rotation 1x10 each way hands on 6" riser      Lumbar Exercises: Aerobic   Elliptical 3 min warm up, PT present to discuss symptoms and activity level day prior      Lumbar Exercises: Standing   Other Standing Lumbar Exercises pallof press cable pulley 55lb chest press and diag overhead press 1x10 each, both sides    Other Standing Lumbar Exercises 30lb kettlebell carry 1x30' Rt/Lt UE      Lumbar Exercises: Supine   Other Supine Lumbar Exercises double leg lowering and lifting x 15 reps holding edge of mat table with bil UEs    Other Supine Lumbar Exercises balanced seated knee hug to V with arms overhead x 10   hamstrings limit full knee ext V LE position     Lumbar Exercises: Prone   Other Prone Lumbar Exercises 3 sets: 15 sec plank feet on sliders, then double knee slow mountain climber x 5      Knee/Hip Exercises: Stretches   Hip Flexor Stretch Both;1 rep;20 seconds    Hip Flexor Stretch Limitations with heel raise  and arms overhead      Modalities   Modalities Moist Heat      Moist Heat Therapy   Number Minutes Moist Heat 10 Minutes    Moist Heat Location Lumbar Spine;Hip   bil hips           Trigger Point Dry Needling - 04/21/21 0001    Consent Given? Yes    Education Handout Provided Previously provided    Muscles Treated Back/Hip Lumbar multifidi;Gluteus medius;Piriformis;Gluteus maximus    Other Dry Needling bil    Gluteus Minimus Response Twitch response elicited;Palpable increased muscle length    Gluteus Medius Response Twitch response elicited;Palpable increased muscle length    Gluteus Maximus Response Twitch response elicited;Palpable increased muscle length    Piriformis Response Twitch response elicited;Palpable increased muscle length    Lumbar multifidi Response Palpable increased muscle length                PT Education - 04/21/21 0816    Education Details Pt  made notes in phone for ther ex today, PT recommended foundation training original video YouTube    Methods Explanation;Demonstration    Comprehension Verbalized understanding;Returned demonstration            PT Short Term Goals - 04/21/21 1240      PT SHORT TERM GOAL #1   Title be independent in initial HEP    Status On-going             PT Long Term Goals - 04/14/21 1608      PT LONG TERM GOAL #1   Title be independent in advanced HEP    Time 8    Period Weeks    Status New    Target Date 06/09/21      PT LONG TERM GOAL #2   Title report a 70% reduction in frequency and intensity of LBP with daily tasks and after weight training    Time 8    Period Weeks    Status New    Target Date 06/09/21      PT LONG TERM GOAL #3   Title improve core strength/endurance to perform heavy lifting squats with good alignment    Time 8    Period Weeks    Status New    Target Date 06/09/21      PT LONG TERM GOAL #4   Title improve FOTO to > or = to 89 to improve funtion    Baseline 83    Time 8    Period Weeks    Status New    Target Date 06/09/21                 Plan - 04/21/21 1236    Clinical Impression Statement Pt arrived with report of tightness vs tightness and pain since DN at her initial visit.  She had a very active day yesterday with yoga, [employer] class, running and weighted squats.  PT advanced ther ex today for lateral trunk stabilization and core exercise to support weight training.  PT also recommended foundation training video on YouTube which Pt will look up and try on her own.  Pt had return of bil hip and lumbar TPs which had signif twitch and release today with DN #2.  Pt continues to need to perform LE stretching with ongoing flexibility limitations in hamstrings, hip flexors and gluteals.  Continue along POC.    Comorbidities history of LBP    PT Frequency 1x / week  PT Duration 8 weeks    PT Treatment/Interventions ADLs/Self Care Home  Management;Cryotherapy;Electrical Stimulation;Moist Heat;Traction;Stair training;Gait training;Therapeutic activities;Therapeutic exercise;Balance training;Neuromuscular re-education;Patient/family education;Manual techniques;Taping;Dry needling;Passive range of motion;Spinal Manipulations;Joint Manipulations    PT Next Visit Plan DN to lumbar spine (maybe add electrical stim) and gluteals.  Add core/lumbopelvic strength, did Pt try foundation training YouTube video?    PT Home Exercise Plan Access Code: [email or web address]- low back    Consulted and Agree with Plan of Care Patient           Patient will benefit from skilled therapeutic intervention in order to improve the following deficits and impairments:     Visit Diagnosis: Acute bilateral low back pain without sciatica  Muscle weakness (generalized)  Cramp and spasm     Problem List Patient Active Problem List   Diagnosis Date Noted  . Thyroid nodule 07/17/2019  . Eczema 07/19/2017  . Allergic rhinitis 07/19/2017    Morton Peters, PT 04/21/21 12:41 PM   [facility] [address], [address] [place], [place], [address] Phone: [phone]   Fax:  [phone]  Name: Angelica Norman MRN: [id number] Date of Birth: Oct 29, 1991

## 2021-04-21 NOTE — Patient Instructions (Signed)
Foundation training original 12' video on YouTube

## 2021-04-28 ENCOUNTER — Other Ambulatory Visit: Payer: Self-pay

## 2021-04-28 ENCOUNTER — Ambulatory Visit: Payer: No Typology Code available for payment source

## 2021-04-28 DIAGNOSIS — R252 Cramp and spasm: Secondary | ICD-10-CM

## 2021-04-28 DIAGNOSIS — M6281 Muscle weakness (generalized): Secondary | ICD-10-CM

## 2021-04-28 DIAGNOSIS — M545 Low back pain, unspecified: Secondary | ICD-10-CM

## 2021-04-28 NOTE — Therapy (Signed)
[facility] [address], [address] [place], [place], [address] Phone: [phone]   Fax:  [phone]  Physical Therapy Treatment  Patient Details  Name: Angelica Norman MRN: [id number] Date of Birth: 04-14-1991 Referring Provider (PT): [place], Betty, MD   Encounter Date: 04/28/2021   PT End of Session - 04/28/21 1621    Visit Number 3    Date for PT Re-Evaluation 06/09/21    Authorization Type [facility]    PT Start Time 1532   DN   PT Stop Time 1610    PT Time Calculation (min) 38 min    Activity Tolerance Patient tolerated treatment well    Behavior During Therapy [facility] for tasks assessed/performed           Past Medical History:  Diagnosis Date  . Abnormal Pap smear of cervix 07/2015   Neg/Pos HR HPV--in [place]--no colpo/no treatment  . Allergy   . Eczema   . Thyroid nodule 07/2018    History reviewed. No pertinent surgical history.  There were no vitals filed for this visit.   Subjective Assessment - 04/28/21 1531    Subjective Things are moving in the right direction.  I have tried the foundations training and it is going well.  I feel 50% better overall.  Pain is better, I am still stiff.    Patient Stated Goals reduce LBP with daily tasks and weight training    Currently in Pain? Yes    Pain Score 0-No pain   3-4/10   Pain Location Back    Pain Orientation Right;Lower    Pain Descriptors / Indicators Tightness    Pain Type Chronic pain    Pain Onset More than a month ago    Pain Frequency Intermittent    Aggravating Factors  forward hinge    Pain Relieving Factors massage gun, stretching                             [facility] Adult PT Treatment/Exercise - 04/28/21 0001      Modalities   Modalities Electrical Stimulation      Electrical Stimulation   Electrical Stimulation Location bil lumbar multifidi    Electrical Stimulation Action IFC    Electrical Stimulation  Parameters DN paired with e-stim attended by PT    Electrical Stimulation Goals Pain;Tone      Manual Therapy   Manual Therapy Soft tissue mobilization    Manual therapy comments monitoring, assessment and skilled palpation during dry needling    Soft tissue mobilization elongation to bil gluteals Rt>Lt and bil lumbar paraspinals                    PT Short Term Goals - 04/21/21 1240      PT SHORT TERM GOAL #1   Title be independent in initial HEP    Status On-going             PT Long Term Goals - 04/14/21 1608      PT LONG TERM GOAL #1   Title be independent in advanced HEP    Time 8    Period Weeks    Status New    Target Date 06/09/21      PT LONG TERM GOAL #2   Title report a 70% reduction in frequency and intensity of LBP with daily tasks and after weight training    Time 8  Period Weeks    Status New    Target Date 06/09/21      PT LONG TERM GOAL #3   Title improve core strength/endurance to perform heavy lifting squats with good alignment    Time 8    Period Weeks    Status New    Target Date 06/09/21      PT LONG TERM GOAL #4   Title improve FOTO to > or = to 89 to improve funtion    Baseline 83    Time 8    Period Weeks    Status New    Target Date 06/09/21                 Plan - 04/28/21 1619    Clinical Impression Statement Pt is working on all HEP for functional strength and flexibility.  Pt is improving her alignment with heavy weight squats while weight training. Verbal review of all HEP issued last session and pt is performing regularly.  Pt with significant trigger point in Rt gluteals and mild tension/trigger points in Lt gluteals and multifidi.  Pt with good twitch response and with dry needling and had improved tissue mobility after dry needling and manual therapy.  Pt reports 50% overall reduction in her pain and stiffness since the onset of care.  Pt will continue to benefit from skilled PT to address to strength and  tissue mobility.    PT Frequency 1x / week    PT Duration 8 weeks    PT Treatment/Interventions ADLs/Self Care Home Management;Cryotherapy;Electrical Stimulation;Moist Heat;Traction;Stair training;Gait training;Therapeutic activities;Therapeutic exercise;Balance training;Neuromuscular re-education;Patient/family education;Manual techniques;Taping;Dry needling;Passive range of motion;Spinal Manipulations;Joint Manipulations    PT Next Visit Plan work on functional strength, dry needling    PT Home Exercise Plan Access Code: [email or web address]- low back    Consulted and Agree with Plan of Care Patient           Patient will benefit from skilled therapeutic intervention in order to improve the following deficits and impairments:  Impaired flexibility,Pain,Improper body mechanics,Increased muscle spasms  Visit Diagnosis: Acute bilateral low back pain without sciatica  Muscle weakness (generalized)  Cramp and spasm     Problem List Patient Active Problem List   Diagnosis Date Noted  . Thyroid nodule 07/17/2019  . Eczema 07/19/2017  . Allergic rhinitis 07/19/2017    Lorrene Reid, PT 04/28/21 4:24 PM  [facility] [address], [address] [place], [place], [address] Phone: [phone]   Fax:  [phone]  Name: Angelica Norman MRN: [id number] Date of Birth: [birth year]

## 2021-05-12 ENCOUNTER — Other Ambulatory Visit: Payer: Self-pay

## 2021-05-12 ENCOUNTER — Ambulatory Visit: Payer: No Typology Code available for payment source | Attending: Family Medicine

## 2021-05-12 DIAGNOSIS — M6281 Muscle weakness (generalized): Secondary | ICD-10-CM | POA: Diagnosis present

## 2021-05-12 DIAGNOSIS — M25561 Pain in right knee: Secondary | ICD-10-CM | POA: Insufficient documentation

## 2021-05-12 DIAGNOSIS — G8929 Other chronic pain: Secondary | ICD-10-CM | POA: Diagnosis present

## 2021-05-12 DIAGNOSIS — R252 Cramp and spasm: Secondary | ICD-10-CM | POA: Diagnosis present

## 2021-05-12 DIAGNOSIS — M545 Low back pain, unspecified: Secondary | ICD-10-CM | POA: Diagnosis present

## 2021-05-12 NOTE — Therapy (Signed)
[facility] [address] [place], [place], [address] Phone: [phone]   Fax:  [phone]  Physical Therapy Treatment  Patient Details  Name: Angelica Norman MRN: [id number] Date of Birth: 1991-08-21 Referring Provider (PT): [place], Betty, MD   Encounter Date: 05/12/2021   PT End of Session - 05/12/21 1528    Visit Number 4    Date for PT Re-Evaluation 06/09/21    Authorization Type [facility]    PT Start Time 1448   dry needling   PT Stop Time 1527    PT Time Calculation (min) 39 min    Activity Tolerance Patient tolerated treatment well    Behavior During Therapy [facility] for tasks assessed/performed           Past Medical History:  Diagnosis Date  . Abnormal Pap smear of cervix 07/2015   Neg/Pos HR HPV--in [place]--no colpo/no treatment  . Allergy   . Eczema   . Thyroid nodule 07/2018    History reviewed. No pertinent surgical history.  There were no vitals filed for this visit.   Subjective Assessment - 05/12/21 1451    Subjective My back has been great.  My hip is tight.  Now my Rt knee is bothering me.    Patient Stated Goals reduce LBP with daily tasks and weight training    Currently in Pain? No/denies    Multiple Pain Sites Yes    Pain Score 0   up to 6/10 with walking, brief-20 minutes or less   Pain Location Knee    Pain Orientation Right    Pain Descriptors / Indicators Sharp;Stabbing    Pain Type Acute pain    Pain Onset More than a month ago    Pain Frequency Intermittent    Aggravating Factors  walking, it is random                             [facility] Adult PT Treatment/Exercise - 05/12/21 0001      Manual Therapy   Manual Therapy Soft tissue mobilization    Manual therapy comments monitoring, assessment and skilled palpation during dry needling    Soft tissue mobilization elongation to Rt gluteals and Rt quads            Trigger Point Dry  Needling - 05/12/21 0001    Consent Given? Yes    Education Handout Provided Previously provided    Muscles Treated Lower Quadrant Vastus lateralis   Rt only   Muscles Treated Back/Hip Gluteus minimus;Gluteus medius   Rt only   Vastus lateralis Response Twitch response elicited;Palpable increased muscle length    Gluteus Minimus Response Twitch response elicited;Palpable increased muscle length    Gluteus Medius Response Twitch response elicited;Palpable increased muscle length                  PT Short Term Goals - 05/12/21 1448      PT SHORT TERM GOAL #1   Title be independent in initial HEP    Status Achieved      PT SHORT TERM GOAL #2   Title report a 30% reduction in frequency and intensity low back pain with daily activity.             PT Long Term Goals - 04/14/21 1608      PT LONG TERM GOAL #1   Title be independent in advanced HEP    Time  8    Period Weeks    Status New    Target Date 06/09/21      PT LONG TERM GOAL #2   Title report a 70% reduction in frequency and intensity of LBP with daily tasks and after weight training    Time 8    Period Weeks    Status New    Target Date 06/09/21      PT LONG TERM GOAL #3   Title improve core strength/endurance to perform heavy lifting squats with good alignment    Time 8    Period Weeks    Status New    Target Date 06/09/21      PT LONG TERM GOAL #4   Title improve FOTO to > or = to 89 to improve funtion    Baseline 83    Time 8    Period Weeks    Status New    Target Date 06/09/21                 Plan - 05/12/21 1529    Clinical Impression Statement Pt is working on all HEP for functional strength and flexibility.  Pt reports that low back pain has resolved, Rt hip feels tight and now with onset of Rt knee pain over the past 2 weeks.  Pt describes intermittent onset of Rt knee pain mostly with walking.  Pain is 6/10 when it occurs and lasts ~20 minutes. Pt with tension and in Rt lateral  quads distal>proximal and Rt gluteals.  Pt with good response to dry needling with improved tissue mobility and reduced tension after manual therapy.  PT advised pt to stretch quads and hip flexors, perform tissue mobilization and work on single limb squats.  Pt will continue to benefit from skilled PT to address to strength and tissue mobility.    PT Frequency 1x / week    PT Duration 8 weeks    PT Treatment/Interventions ADLs/Self Care Home Management;Cryotherapy;Electrical Stimulation;Moist Heat;Traction;Stair training;Gait training;Therapeutic activities;Therapeutic exercise;Balance training;Neuromuscular re-education;Patient/family education;Manual techniques;Taping;Dry needling;Passive range of motion;Spinal Manipulations;Joint Manipulations    PT Next Visit Plan work on functional strength, dry needling    PT Home Exercise Plan Access Code: [email or web address]- low back    Consulted and Agree with Plan of Care Patient           Patient will benefit from skilled therapeutic intervention in order to improve the following deficits and impairments:  Impaired flexibility,Pain,Improper body mechanics,Increased muscle spasms  Visit Diagnosis: Muscle weakness (generalized)  Acute bilateral low back pain without sciatica  Cramp and spasm     Problem List Patient Active Problem List   Diagnosis Date Noted  . Thyroid nodule 07/17/2019  . Eczema 07/19/2017  . Allergic rhinitis 07/19/2017     Lorrene Reid, PT 05/12/21 3:32 PM  [facility] [address] [place], [place], [address] Phone: [phone]   Fax:  [phone]  Name: Angelica Norman MRN: [id number] Date of Birth: [birth year]

## 2021-05-19 ENCOUNTER — Other Ambulatory Visit: Payer: Self-pay

## 2021-05-19 ENCOUNTER — Ambulatory Visit: Payer: No Typology Code available for payment source

## 2021-05-19 DIAGNOSIS — M6281 Muscle weakness (generalized): Secondary | ICD-10-CM

## 2021-05-19 DIAGNOSIS — M545 Low back pain, unspecified: Secondary | ICD-10-CM

## 2021-05-19 DIAGNOSIS — M25561 Pain in right knee: Secondary | ICD-10-CM

## 2021-05-19 DIAGNOSIS — G8929 Other chronic pain: Secondary | ICD-10-CM

## 2021-05-19 DIAGNOSIS — R252 Cramp and spasm: Secondary | ICD-10-CM

## 2021-05-19 NOTE — Therapy (Signed)
[facility] [address], [place], [place], [address] Phone: [phone]   Fax:  [phone]  Physical Therapy Treatment  Patient Details  Name: Angelica Norman MRN: [id number] Date of Birth: 10/01/1991 Referring Provider (PT): [place], Betty, MD   Encounter Date: 05/19/2021   PT End of Session - 05/19/21 1518     Visit Number 5    Date for PT Re-Evaluation 06/09/21    Authorization Type [facility]    PT Start Time 4562    PT Stop Time 1520    PT Time Calculation (min) 35 min    Activity Tolerance Patient tolerated treatment well    Behavior During Therapy [facility] for tasks assessed/performed             Past Medical History:  Diagnosis Date   Abnormal Pap smear of cervix 07/2015   Neg/Pos HR HPV--in [place]--no colpo/no treatment   Allergy    Eczema    Thyroid nodule 07/2018    History reviewed. No pertinent surgical history.  There were no vitals filed for this visit.   Subjective Assessment - 05/19/21 1448     Subjective My back is not a problem at all.  That seems to have resolved.  My knee is still bothering me.  What I have noticed is that it is worse with pivoting on the Rt.    Patient Stated Goals reduce LBP with daily tasks and weight training    Currently in Pain? No/denies    Pain Score 6    Pain Location Knee    Pain Orientation Right    Pain Descriptors / Indicators Sharp;Stabbing    Pain Onset More than a month ago    Pain Frequency Intermittent    Aggravating Factors  walking, pivoting on the knee    Pain Relieving Factors sitting down, usually lasts 20 minutes                [facility] PT Assessment - 05/19/21 0001       Assessment   Medical Diagnosis low back pain, unspecified chronicity    Referring Provider (PT) [place], Betty, MD    Onset Date/Surgical Date 02/28/21      Cognition   Overall Cognitive Status Within Functional Limits for tasks assessed       Observation/Other Assessments   Focus on Therapeutic Outcomes (FOTO)  94      Posture/Postural Control   Posture/Postural Control No significant limitations      Palpation   Spinal mobility reduced spinal mobility T8-L5 without pain                                     PT Short Term Goals - 05/19/21 1456       PT SHORT TERM GOAL #2   Title report a 30% reduction in frequency and intensity low back pain with daily activity.    Status Achieved               PT Long Term Goals - 05/19/21 1456       PT LONG TERM GOAL #1   Title be independent in advanced HEP    Status Achieved      PT LONG TERM GOAL #2   Title report a 70% reduction in frequency and intensity of LBP with daily tasks and after weight training    Status Achieved  PT LONG TERM GOAL #3   Title improve core strength/endurance to perform heavy lifting squats with good alignment    Status Achieved                   Plan - 05/19/21 1526     Clinical Impression Statement Pt is working on all HEP for functional strength and flexibility.  Pt reports that low back pain has resolved, Rt hip feels tight and now with onset of Rt knee pain over the past 2-3weeks.  Pt describes intermittent onset of Rt knee pain mostly with walking and pivoting on the Rt knee.  Pain is 6/10 when it occurs and lasts ~20 minutes. Pt with tension and in Rt lateral quads distal>proximal and Rt gluteals.  Pt with good response to dry needling with improved tissue mobility and reduced tension after manual therapy.  Pt demonstrates some compensation with single leg squat on the Lt and mild ER of the Rt hip with descending steps.  Pt reports 100% resolution of lumbar symptoms and will be discharged.  Pt will follow-up with MD regarding onset of Rt lateral knee pain.    PT Next Visit Plan D/C PT to HEP    PT Home Exercise Plan Access Code: [email or web address]- low back    Recommended Other Services initial cert is  signed    Consulted and Agree with Plan of Care Patient             Patient will benefit from skilled therapeutic intervention in order to improve the following deficits and impairments:     Visit Diagnosis: Acute bilateral low back pain without sciatica  Muscle weakness (generalized)  Cramp and spasm  Chronic pain of right knee     Problem List Patient Active Problem List   Diagnosis Date Noted   Thyroid nodule 07/17/2019   Eczema 07/19/2017   Allergic rhinitis 07/19/2017   PHYSICAL THERAPY DISCHARGE SUMMARY  Visits from Start of Care: 5  Current functional level related to goals / functional outcomes: See above for current status.  Lumbar symptoms have resolved and pt will follow-up with MD regarding Rt knee symptoms.    Remaining deficits: No deficits related to lumbar pain.    Education / Equipment: HEP, mechanics    Patient agrees to discharge. Patient goals were met. Patient is being discharged due to meeting the stated rehab goals. [place], PT 05/19/21 3:28 PM  [facility] [address], [place], [place], [address] Phone: [phone]   Fax:  [phone]  Name: Angelica Norman MRN: [id number] Date of Birth: 08-Sep-1991

## 2021-05-20 ENCOUNTER — Ambulatory Visit (INDEPENDENT_AMBULATORY_CARE_PROVIDER_SITE_OTHER): Payer: No Typology Code available for payment source | Admitting: Family Medicine

## 2021-05-20 ENCOUNTER — Encounter: Payer: Self-pay | Admitting: Family Medicine

## 2021-05-20 VITALS — BP 110/70 | HR 81 | Resp 12 | Ht 64.5 in | Wt 139.0 lb

## 2021-05-20 DIAGNOSIS — M25561 Pain in right knee: Secondary | ICD-10-CM | POA: Diagnosis not present

## 2021-05-20 DIAGNOSIS — G8929 Other chronic pain: Secondary | ICD-10-CM

## 2021-05-20 NOTE — Progress Notes (Signed)
Chief Complaint  Patient presents with   Knee Pain    Right knee, lateral feels deep under the knee. Has done PT before and it helped. Pt interested in sports med referral.   HPI: Angelica Norman is a 30 y.o. female with history of seasonal allergies, eczema, and back pain here today complaining of right knee pain as described above. She has had right knee pain intermittently for 1-2 years. Sharp pain, 6/10, not radiated. No associated limitation of movement, edema, or erythema.  Exacerbated by certain activities that involve turning rapidly,walking,and knee rotation. Pain is alleviated by resting for a few minutes.  "Catching" like sensation in joint. Pain last about 20-30 min.  No hx of knee injury. No other joint involvement.  Most of the time she does not take OTC medication, occasionally ibuprofen. She would like a referral to see sports medicine provider.  Review of Systems  Constitutional:  Negative for chills, fatigue and fever.  HENT:  Negative for mouth sores and sore throat.   Musculoskeletal:  Negative for gait problem.  Skin:  Negative for pallor and rash.  Rest see pertinent positives and negatives per HPI.  Current Outpatient Medications on File Prior to Visit  Medication Sig Dispense Refill   cetirizine (ZYRTEC) 10 MG tablet Take 10 mg by mouth daily.     doxycycline (VIBRA-TABS) 100 MG tablet Take 1 tablet (100 mg total) by mouth 2 (two) times daily. 10 tablet 0   fluticasone (FLONASE) 50 MCG/ACT nasal spray USE 1 SPRAY INTO EACH NOSTRIL DAILY. 16 g 2   levonorgestrel (MIRENA) 20 MCG/24HR IUD 1 each by Intrauterine route once.     montelukast (SINGULAIR) 10 MG tablet Take 1 tablet (10 mg total) by mouth at bedtime. 90 tablet 3   triamcinolone cream (KENALOG) 0.1 % Apply 1 application topically daily as needed. 45 g 2   No current facility-administered medications on file prior to visit.   Past Medical History:  Diagnosis Date   Abnormal  Pap smear of cervix 07/2015   Neg/Pos HR HPV--in [place]--no colpo/no treatment   Allergy    Eczema    Thyroid nodule 07/2018   No Known Allergies  Social History   Socioeconomic History   Marital status: Single    Spouse name: Not on file   Number of children: Not on file   Years of education: Not on file   Highest education level: Not on file  Occupational History   Not on file  Tobacco Use   Smoking status: Never   Smokeless tobacco: Never  Vaping Use   Vaping Use: Never used  Substance and Sexual Activity   Alcohol use: Yes    Alcohol/week: 3.0 standard drinks    Types: 3 Cans of beer per week   Drug use: No   Sexual activity: Yes    Partners: Male    Birth control/protection: Implant    Comment: Nexplanon inserted 10-26-14/Lt.arm  Other Topics Concern   Not on file  Social History Narrative   Not on file   Social Determinants of Health   Financial Resource Strain: Not on file  Food Insecurity: Not on file  Transportation Needs: Not on file  Physical Activity: Not on file  Stress: Not on file  Social Connections: Not on file    Vitals:   05/20/21 1024  BP: 110/70  Pulse: 81  Resp: 12  SpO2: 97%   Body mass index is 23.49 kg/m.  Physical Exam Vitals and nursing  note reviewed.  Constitutional:      General: She is not in acute distress.    Appearance: She is well-developed. She is not ill-appearing.  HENT:     Head: Normocephalic and atraumatic.  Eyes:     Conjunctiva/sclera: Conjunctivae normal.  Cardiovascular:     Rate and Rhythm: Normal rate and regular rhythm.     Pulses:          Dorsalis pedis pulses are 2+ on the right side.       Posterior tibial pulses are 2+ on the right side.  Pulmonary:     Effort: Pulmonary effort is normal. No respiratory distress.  Musculoskeletal:     Comments: Right knee: on inspection no effusion, erythema, or deformities. Valgus and varus stress normal, McMurray negative (meniscus), anterior and  posterior drawer test negative. Patellar apprehension test negative.  Skin:    General: Skin is warm.     Findings: No erythema or rash.  Neurological:     Mental Status: She is alert and oriented to person, place, and time.  Psychiatric:     Comments: Well groomed, good eye contact.    ASSESSMENT AND PLAN:  [name] was seen today for knee pain.  Diagnoses and all orders for this visit:  Chronic pain of right knee -     Ambulatory referral to Sports Medicine  Examination today negative. PT has helped temporarily. Appointment with sports medicine will be arranged. Try to avoid activities that can aggravate pain. She can take ibuprofen 200 to 400 mg 3 times daily with meals as needed.  Return if symptoms worsen or fail to improve.  [name], MD  [facility]. [place] office.

## 2021-05-23 NOTE — Progress Notes (Signed)
Subjective:    I'm seeing this patient as a consultation for:  Dr. [place]. Note will be routed back to referring provider/PCP.  CC: R knee pain  I, Angelica Norman, LAT, ATC, am serving as scribe for Dr. Clementeen Norman.  HPI: Pt is a 30 y/o female presenting w/ R lateral knee pain x 1-2 years. Pt is very athletic (power lifting and yoga) and has had knee issues since childhood. Pt notes she completed PT previously. She locates her R knee pain to anterior aspect of the knee, lateral to the patella. Pt describes pain as "sharp" and "stabbing" pain. Increased pain w/ pivoting and changing directions. Pt works as an Pharmacist, community at the hospital. She also participates in power yoga and power weightlifting.  She does a lot of home exercises herself.  Her husband is a Audiological scientist at a [employer].  R knee swelling: no R knee mechanical symptoms: yes, intermittent knee catching Aggravating factors: Walking; loaded rotation/turning/twisting;  Treatments tried: PT, dry needling, IBU, cupping  Past medical history, Surgical history, Family history, Social history, Allergies, and medications have been entered into the medical record, reviewed.   Review of Systems: No new headache, visual changes, nausea, vomiting, diarrhea, constipation, dizziness, abdominal pain, skin rash, fevers, chills, night sweats, weight loss, swollen lymph nodes, body aches, joint swelling, muscle aches, chest pain, shortness of breath, mood changes, visual or auditory hallucinations.   Objective:    Vitals:   05/26/21 1003  BP: 128/74  Pulse: 88  SpO2: 99%   General: Well Developed, well nourished, and in no acute distress.  Neuro/Psych: Alert and oriented x3, extra-ocular muscles intact, able to move all 4 extremities, sensation grossly intact. Skin: Warm and dry, no rashes noted.  Respiratory: Not using accessory muscles, speaking in full sentences, trachea midline.  Cardiovascular: Pulses palpable, no  extremity edema. Abdomen: Does not appear distended. MSK: Right knee normal-appearing well-developed VMO bulk. Normal knee motion. Nontender.  Normal stability including lateral patella stability. Ligament exam testing normal. Negative McMurray's test. Intact strength.  Right hip normal-appearing Hip abduction slightly diminished 4+/5.   Lab and Radiology Results  Diagnostic Limited MSK Ultrasound of: Right knee Quad tendon intact and normal appearing. No joint effusion. Patellar tendon normal-appearing Area of pain at the lateral patella visualized.  Tiny lateral patella osteophyte and lateral femoral distal osteophyte present.  No soft tissue impingement or subluxation visible. Medial and lateral joint line normal-appearing with normal.  Menisci. No Baker's cyst. Impression: Tiny osteophyte lateral patella   X-ray images right knee obtained today personally and independently interpreted Lucency present medial femoral condyle articular surface concerning for OCD or bone cyst. Await formal radiology review  Impression and Recommendations:    Assessment and Plan: 30 y.o. female with right knee pain mostly focused to the lateral patella.  However x-ray is concerning for an OCD or bone cyst at the medial femoral condyle.  As she has had chronic pain now for greater than 6 weeks and has had trials of physical therapy for greater than 6 weeks and other conservative management strategies directed by physician would recommend MRI as next step.  Additionally discussed patella stabilization strategies.  Recommend hip abduction strengthening and Kinesiotape and possibly Tru pull lite knee brace.  Recheck after MRI..  Of note neck obtained after patient had already left the office.  I called her relayed my findings and that the plan had changed since we discussed.  Plan for MRI.  PDMP not reviewed this  encounter. Orders Placed This Encounter  Procedures   [place] LIMITED JOINT SPACE STRUCTURES  LOW RIGHT(NO LINKED CHARGES)    Standing Status:   Future    Number of Occurrences:   1    Standing Expiration Date:   11/25/2021    Order Specific Question:   Reason for Exam (SYMPTOM  OR DIAGNOSIS REQUIRED)    Answer:   right knee pain    Order Specific Question:   Preferred imaging location?    Answer:   [facility] Knee AP/LAT W/Sunrise Right    Standing Status:   Future    Number of Occurrences:   1    Standing Expiration Date:   05/26/2022    Order Specific Question:   Reason for Exam (SYMPTOM  OR DIAGNOSIS REQUIRED)    Answer:   right knee pain    Order Specific Question:   Preferred imaging location?    Answer:   [facility]    Order Specific Question:   Is patient pregnant?    Answer:   No   No orders of the defined types were placed in this encounter.   Discussed warning signs or symptoms. Please see discharge instructions. Patient expresses understanding.   The above documentation has been reviewed and is accurate and complete Angelica Norman, M.D.

## 2021-05-26 ENCOUNTER — Ambulatory Visit: Payer: No Typology Code available for payment source | Admitting: Family Medicine

## 2021-05-26 ENCOUNTER — Other Ambulatory Visit: Payer: Self-pay

## 2021-05-26 ENCOUNTER — Ambulatory Visit (INDEPENDENT_AMBULATORY_CARE_PROVIDER_SITE_OTHER): Payer: No Typology Code available for payment source

## 2021-05-26 ENCOUNTER — Ambulatory Visit: Payer: Self-pay

## 2021-05-26 VITALS — BP 128/74 | HR 88 | Ht 64.5 in | Wt 139.0 lb

## 2021-05-26 DIAGNOSIS — M93261 Osteochondritis dissecans, right knee: Secondary | ICD-10-CM

## 2021-05-26 DIAGNOSIS — G8929 Other chronic pain: Secondary | ICD-10-CM | POA: Diagnosis not present

## 2021-05-26 DIAGNOSIS — M25561 Pain in right knee: Secondary | ICD-10-CM

## 2021-05-26 IMAGING — DX DG KNEE AP/LAT W/ SUNRISE*R*
3 series · 3 of 3 positions shown · non-contrast
Comparison: None.

CLINICAL DATA: Right lateral knee pain for 2 years, no known
injury, initial encounter

EXAM:
RIGHT KNEE 3 VIEWS

[knee ap]
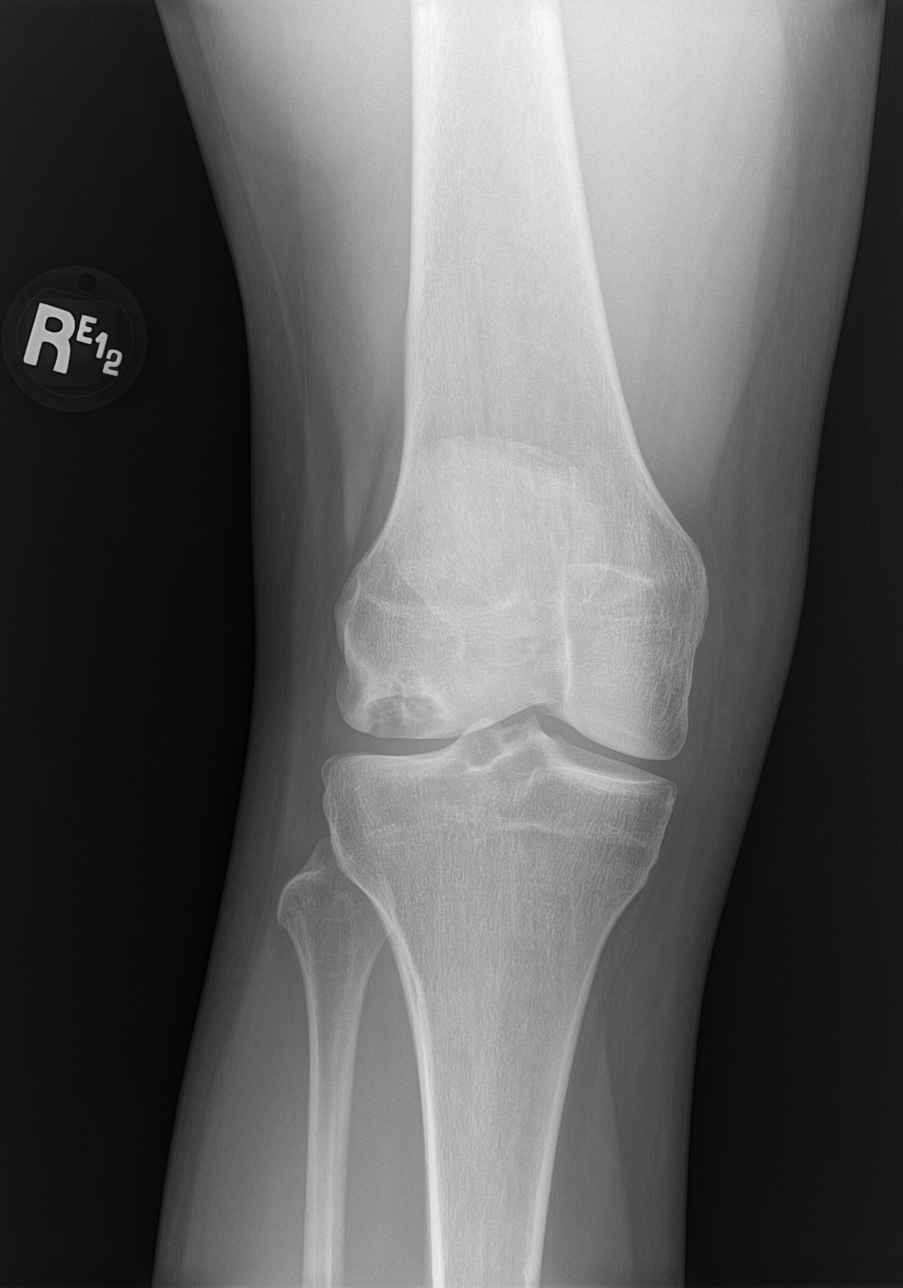

[knee lat]
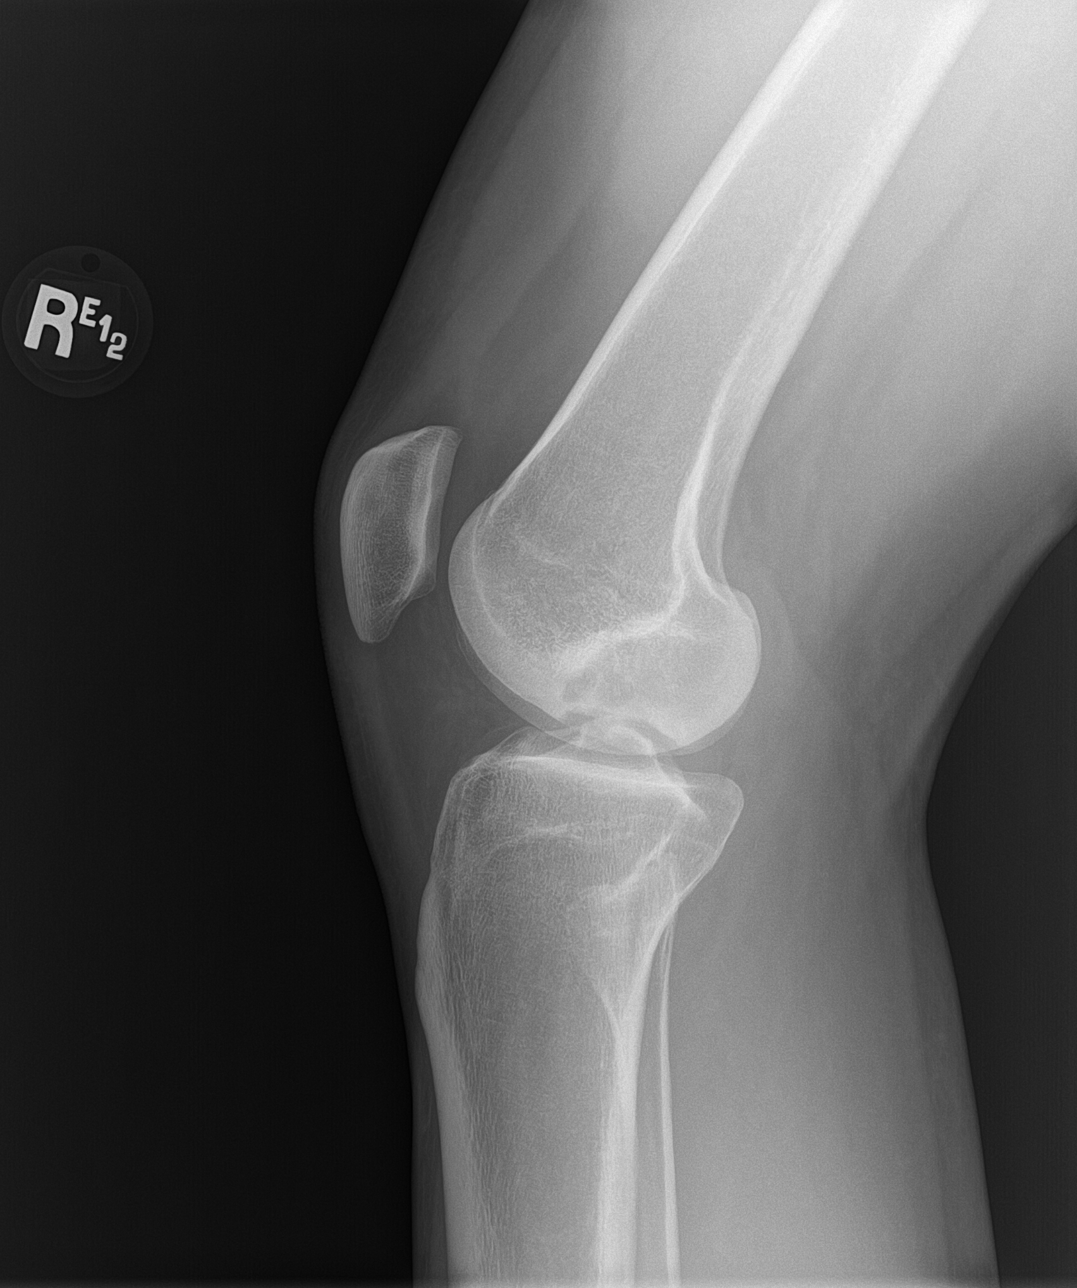

[patella]
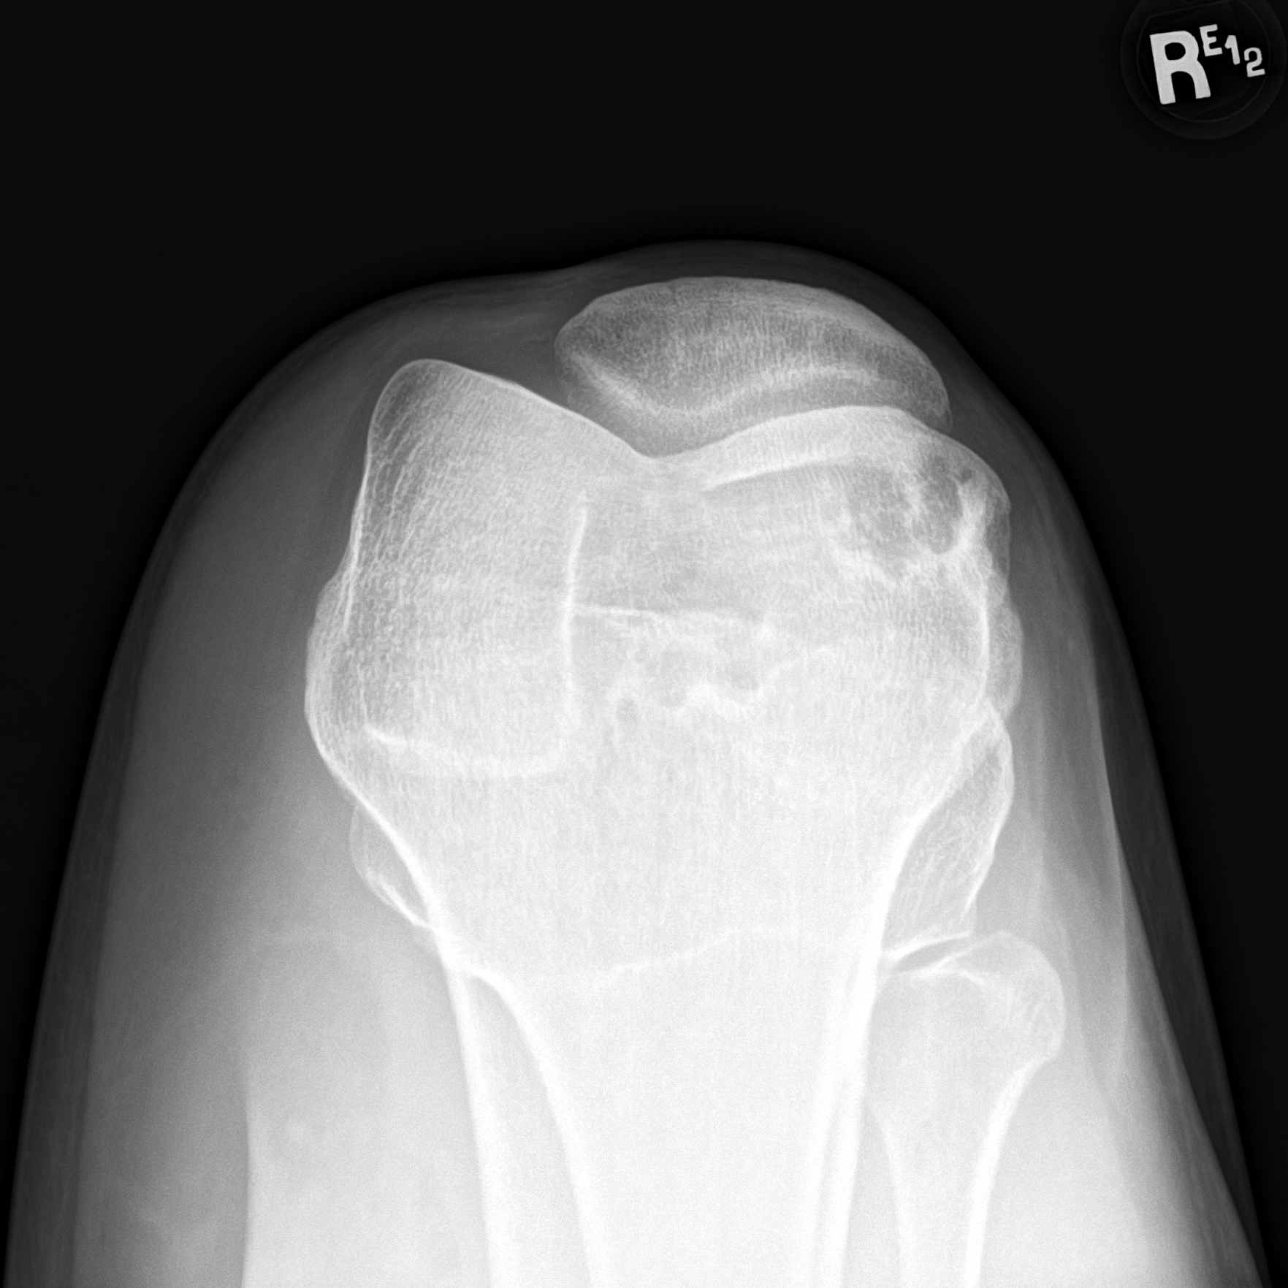

[3 of 3 positions shown; findings below may reference images not displayed]

FINDINGS: Irregularity in the lateral femoral condyle is noted likely related
to osteochondritis desiccans. No free fragment is seen at this time.
No joint effusion is noted. No fracture is noted.
IMPRESSION: Changes in the lateral femoral condyle likely related to
osteochondritis dissecans. No free fragment is noted at this time.

## 2021-05-26 NOTE — Patient Instructions (Signed)
Thank you for coming in today.   Work on hip abductors.   K-tape for Patella lateral stability.   Angelica Norman TruPul Lite Knee brace (maybe).   If not better let me know and I will order and MRI.   Give it a few weeks.   Please use Voltaren gel (Generic Diclofenac Gel) up to 4x daily for pain as needed.  This is available over-the-counter as both the name brand Voltaren gel and the generic diclofenac gel.   Please get an Xray today before you leave

## 2021-05-27 ENCOUNTER — Encounter: Payer: Self-pay | Admitting: Family Medicine

## 2021-05-27 DIAGNOSIS — M93261 Osteochondritis dissecans, right knee: Secondary | ICD-10-CM

## 2021-05-27 NOTE — Progress Notes (Signed)
Lateral femoral condyle has changes that are concerning for osteochondritis dissecans.  This is where a chunk of cartilage and sometimes bone gets knocked free from the weightbearing surface of the knee.  I have already ordered an MRI to confirm and evaluate this further.  I would expect this is probably the source of your knee pain.  More information forthcoming soon.

## 2021-06-01 ENCOUNTER — Ambulatory Visit (INDEPENDENT_AMBULATORY_CARE_PROVIDER_SITE_OTHER): Payer: No Typology Code available for payment source

## 2021-06-01 ENCOUNTER — Other Ambulatory Visit: Payer: Self-pay

## 2021-06-01 DIAGNOSIS — M25561 Pain in right knee: Secondary | ICD-10-CM

## 2021-06-01 DIAGNOSIS — G8929 Other chronic pain: Secondary | ICD-10-CM

## 2021-06-01 DIAGNOSIS — M93261 Osteochondritis dissecans, right knee: Secondary | ICD-10-CM

## 2021-06-01 IMAGING — MR MR KNEE*R* W/O CM
7 series · 40 of 40 positions shown · non-contrast
Comparison: Knee radiograph [DATE]

CLINICAL DATA: Knee pain

EXAM:
MRI OF THE RIGHT KNEE WITHOUT CONTRAST
TECHNIQUE: Multiplanar, multisequence MR imaging of the knee was performed. No
intravenous contrast was administered.

[Series 3: T2 fat-sat · axial · 4.0mm · 0.53mm/px · z∈[-85,+85]mm · 6 of 35 slices shown (1 of 3)]
[im 1/35]
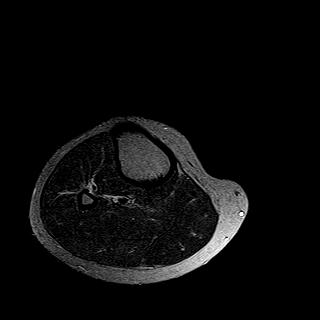
[im 7/35]
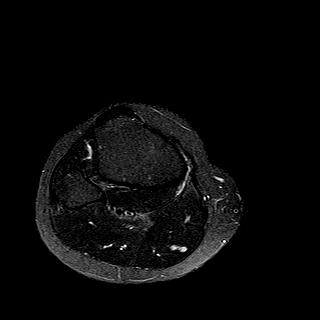
[im 14/35]
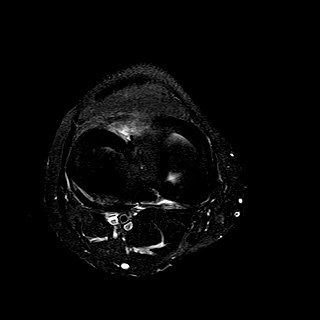
[im 21/35]
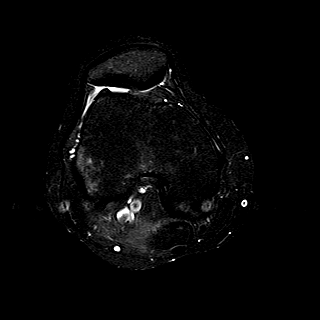
[im 28/35]
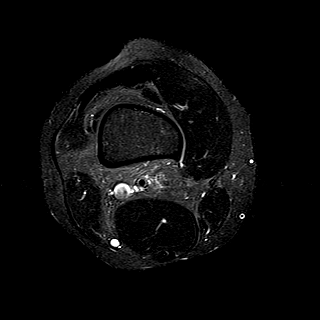
[im 35/35]
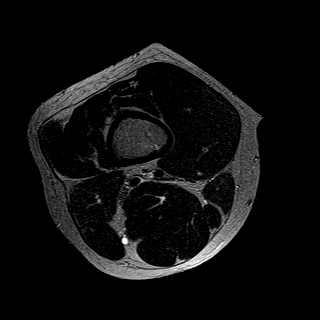

[Series 4: T1 · coronal · 4.0mm · 0.62mm/px · 6 of 28 slices shown]
[im 1/28]
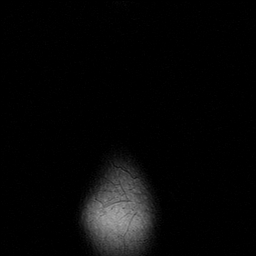
[im 6/28]
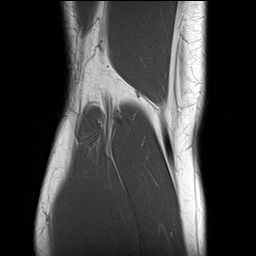
[im 11/28]
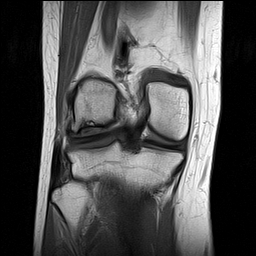
[im 17/28]
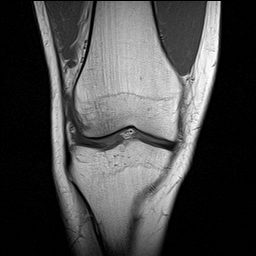
[im 22/28]
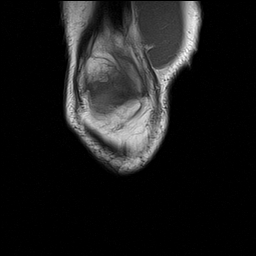
[im 28/28]
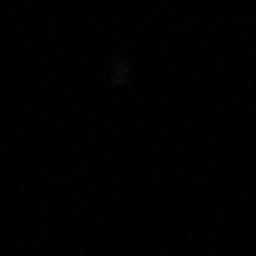

[Series 5: T2 fat-sat · coronal · 4.0mm · 0.62mm/px · 6 of 28 slices shown (2 of 3)]
[im 1/28]
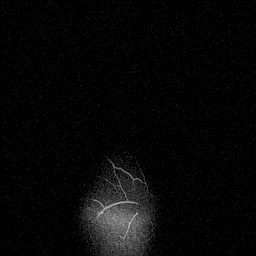
[im 6/28]
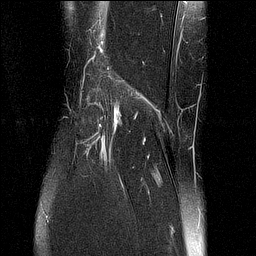
[im 11/28]
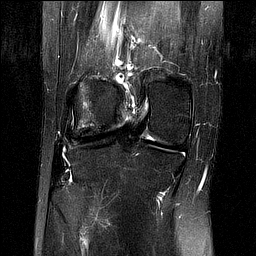
[im 17/28]
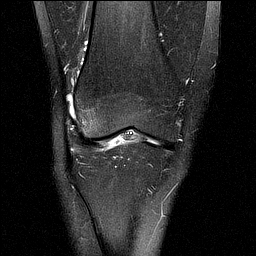
[im 22/28]
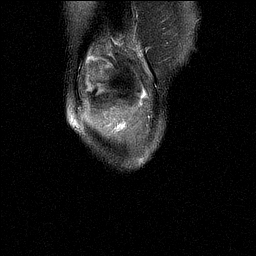
[im 28/28]
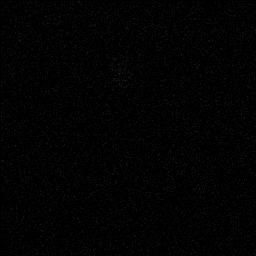

[Series 6: PD fat-sat · coronal · 4.0mm · 0.62mm/px · 6 of 28 slices shown (1 of 3)]
[im 1/28]
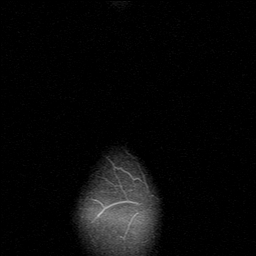
[im 6/28]
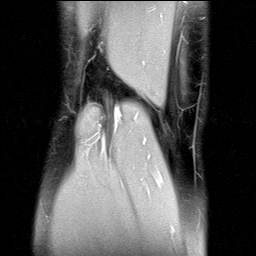
[im 11/28]
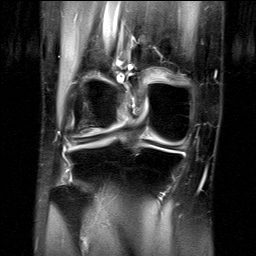
[im 17/28]
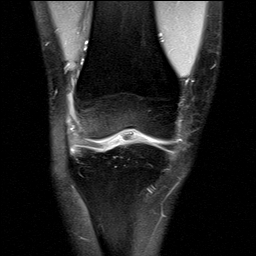
[im 22/28]
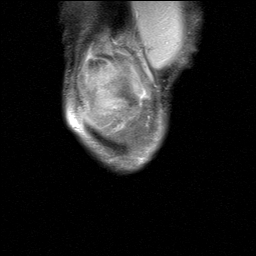
[im 28/28]
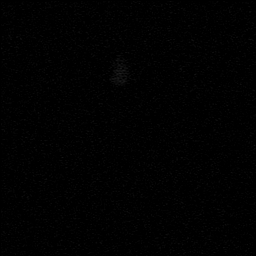

[Series 7: PD fat-sat · sagittal · 3.0mm · 0.62mm/px · 6 of 30 slices shown (2 of 3)]
[im 1/30]
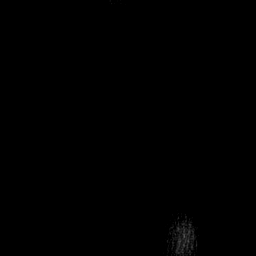
[im 6/30]
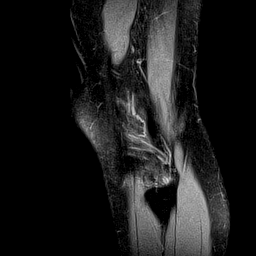
[im 12/30]
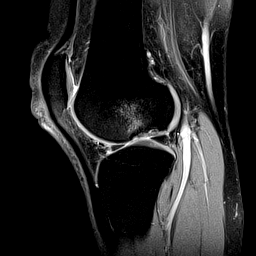
[im 18/30]
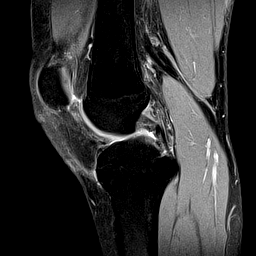
[im 24/30]
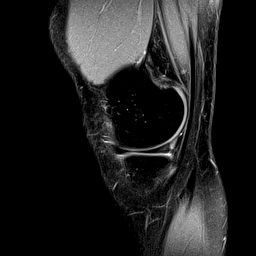
[im 30/30]
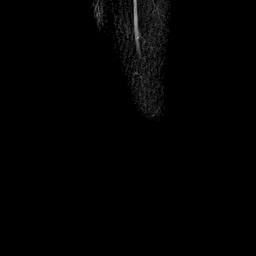

[Series 8: T2 fat-sat · sagittal · 3.0mm · 0.62mm/px · 6 of 31 slices shown (3 of 3)]
[im 1/31]
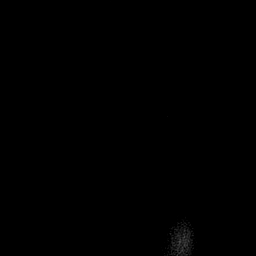
[im 7/31]
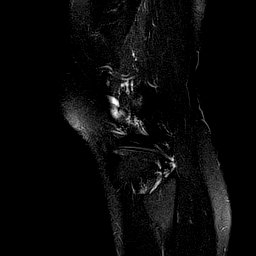
[im 13/31]
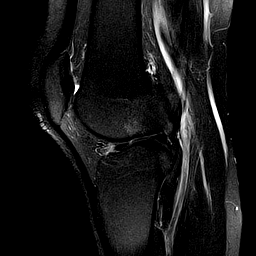
[im 19/31]
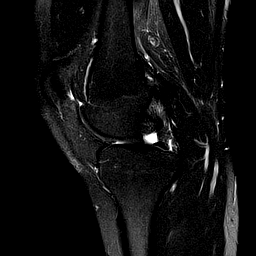
[im 25/31]
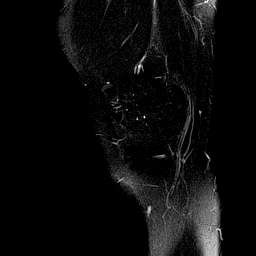
[im 31/31]
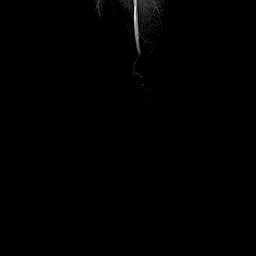

[Series 9: PD fat-sat · oblique · 2.0mm · 0.62mm/px · 4 of 19 slices shown (3 of 3)]
[im 1/19]
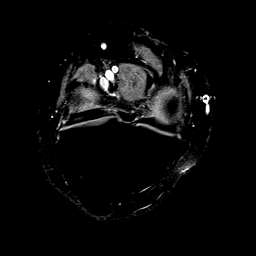
[im 7/19]
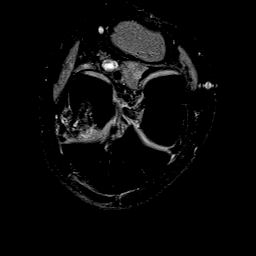
[im 13/19]
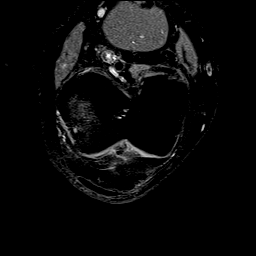
[im 19/19]
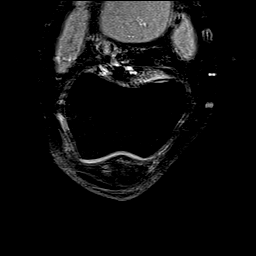

[40 of 40 positions shown; findings below may reference images not displayed]

FINDINGS: MENISCI

Medial: Intrasubstance degenerative signal at the posterior
horn-body junction without definitive medial meniscus tear.

Lateral: Nondisplaced degenerative horizontal tearing of the body of
the lateral meniscus extending into the anterior and posterior
horns.

LIGAMENTS

Cruciates: ACL and PCL are intact.

Collaterals: Medial collateral ligament is intact. Lateral
collateral ligament complex is intact.

CARTILAGE

Patellofemoral:  No chondral defect.

Medial:  No chondral defect.

Lateral: There is a 2.0 x 1.7 cm osteochondral defect along the
weight-bearing lateral femoral condyle, with underlying cystic
change in bony edema.

JOINT: No significant joint effusion

POPLITEAL FOSSA: . no significant Baker cyst.

EXTENSOR MECHANISM: Intact quadriceps tendon. Intact patellar
tendon. Intact lateral patellar retinaculum. Intact medial patellar
retinaculum. Intact MPFL.

BONES: No aggressive osseous lesion.

Other: No fluid collection or hematoma. Muscles are normal.
IMPRESSION: Osteochondral defect along the weight-bearing lateral femoral
condyle measuring 2.0 x 1.7 cm, with underlying cystic change and
bony edema.

Nondisplaced degenerative horizontal tearing of the body of the
lateral meniscus extending into the anterior and posterior horns.

## 2021-06-03 NOTE — Progress Notes (Signed)
Right knee MRI shows an osteochondral defect weightbearing lateral femoral condyle measuring 2.0 by 1.7 cm.  For someone as active as you are this probably should be treated surgically.  Additionally you have some small meniscus degenerative tearing as well.  You are scheduled to see me on the 30th.  Would you like me to go ahead and place a referral to orthopedic surgery now?

## 2021-06-05 ENCOUNTER — Ambulatory Visit: Payer: No Typology Code available for payment source | Admitting: Family Medicine

## 2021-06-16 ENCOUNTER — Ambulatory Visit (INDEPENDENT_AMBULATORY_CARE_PROVIDER_SITE_OTHER): Payer: No Typology Code available for payment source | Admitting: Physician Assistant

## 2021-06-16 ENCOUNTER — Encounter: Payer: Self-pay | Admitting: Physician Assistant

## 2021-06-16 DIAGNOSIS — M25561 Pain in right knee: Secondary | ICD-10-CM

## 2021-06-16 DIAGNOSIS — G8929 Other chronic pain: Secondary | ICD-10-CM

## 2021-06-16 NOTE — Progress Notes (Signed)
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  Has had no known injury.  She has had pain on and off for the past 2 years.  She states that the pain in the right knee is worse with dynamic movement.  She has tried physical therapy and takes NSAIDs as needed.  She has had no injections.  She has had no surgical procedures to the right knee. MRI images are reviewed with the patient.  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  Horizontal tear lateral meniscus.  Medial meniscus with degenerative signal within the horn body junction without definite meniscal tear.  Plan :Recommendation is for her to be seen by Dr. August Saucer .  For evaluation and treatment of the large osteochondral lesion involving the lateral femoral condyle.  Appointment was made for next Wednesday at 4 PM.No charge for today's office visit.

## 2021-06-21 ENCOUNTER — Ambulatory Visit (HOSPITAL_COMMUNITY)
Admission: RE | Admit: 2021-06-21 | Discharge: 2021-06-21 | Disposition: A | Discharge status: Home / Self Care | Payer: No Typology Code available for payment source | Source: Ambulatory Visit | Attending: Student | Admitting: Student

## 2021-06-21 ENCOUNTER — Other Ambulatory Visit: Payer: Self-pay

## 2021-06-21 ENCOUNTER — Encounter (HOSPITAL_COMMUNITY): Payer: Self-pay

## 2021-06-21 VITALS — BP 121/61 | HR 71 | Temp 97.7°F | Resp 17

## 2021-06-21 DIAGNOSIS — Z8616 Personal history of COVID-19: Secondary | ICD-10-CM

## 2021-06-21 DIAGNOSIS — J0100 Acute maxillary sinusitis, unspecified: Secondary | ICD-10-CM

## 2021-06-21 MED ORDER — AMOXICILLIN-POT CLAVULANATE 875-125 MG PO TABS: 1.0000 | ORAL_TABLET | Freq: Two times a day (BID) | ORAL | 0 refills | Status: DC

## 2021-06-21 NOTE — Discharge Instructions (Addendum)
-  Start the antibiotic-Augmentin (amoxicillin-clavulanate), 1 pill every 12 hours for 7 days.  You can take this with food like with breakfast and dinner. -Continue over-the-counter medications for symptomatic relief.

## 2021-06-21 NOTE — ED Provider Notes (Signed)
[facility]    CSN: [id number] Arrival date & time: 06/21/21  1636      History   Chief Complaint Chief Complaint  Patient presents with   Appointment    1700   Nasal Congestion    HPI Akiera Allbaugh is a 30 y.o. female presenting with sinus pressure for few days following having COVID-19 two weeks ago.  States that she felt better following COVID diagnosis for about a week, but today endorses 3 days of left-sided maxillary sinus pressure radiating to teeth and left ear.  Denies hearing changes, tinnitus, muffled hearing, new fever/chills, cough.  HPI  Past Medical History:  Diagnosis Date   Abnormal Pap smear of cervix 07/2015   Neg/Pos HR HPV--in [place]--no colpo/no treatment   Allergy    Eczema    Thyroid nodule 07/2018    Patient Active Problem List   Diagnosis Date Noted   Thyroid nodule 07/17/2019   Eczema 07/19/2017   Allergic rhinitis 07/19/2017    History reviewed. No pertinent surgical history.  OB History     Gravida  0   Para  0   Term  0   Preterm  0   AB  0   Living  0      SAB  0   IAB  0   Ectopic  0   Multiple  0   Live Births  0            Home Medications    Prior to Admission medications   Medication Sig Start Date End Date Taking? Authorizing Provider  amoxicillin-clavulanate (AUGMENTIN) 875-125 MG tablet Take 1 tablet by mouth every 12 (twelve) hours. 06/21/21  Yes Rhys Martini, PA-C  cetirizine (ZYRTEC) 10 MG tablet Take 10 mg by mouth daily.    [provider]  fluticasone (FLONASE) 50 MCG/ACT nasal spray USE 1 SPRAY INTO EACH NOSTRIL DAILY. 12/18/19   [place], Betty G, MD  levonorgestrel (MIRENA) 20 MCG/24HR IUD 1 each by Intrauterine route once.    [provider]  montelukast (SINGULAIR) 10 MG tablet Take 1 tablet (10 mg total) by mouth at bedtime. 07/17/19   [place], Betty G, MD  triamcinolone cream (KENALOG) 0.1 % Apply 1 application topically daily as needed.  07/17/19   [place], Betty G, MD    Family History Family History  Problem Relation Age of Onset   Arthritis Mother    Hyperlipidemia Mother    Diabetes Father    Cancer Father        prostate   Thyroid disease Father        hypothyroid   Heart disease Maternal Grandmother    Hyperlipidemia Maternal Grandmother    Stroke Maternal Grandmother    Stroke Paternal Grandmother    Hypertension Paternal Grandmother    Cancer Paternal Grandfather 7       Dec colon cancer    Social History Social History   Tobacco Use   Smoking status: Never   Smokeless tobacco: Never  Vaping Use   Vaping Use: Never used  Substance Use Topics   Alcohol use: Yes    Alcohol/week: 3.0 standard drinks    Types: 3 Cans of beer per week   Drug use: No     Allergies   Patient has no known allergies.   Review of Systems Review of Systems  Constitutional:  Negative for appetite change, chills and fever.  HENT:  Positive for sinus pressure. Negative for congestion, ear pain, rhinorrhea,  sinus pain and sore throat.   Eyes:  Negative for redness and visual disturbance.  Respiratory:  Negative for cough, chest tightness, shortness of breath and wheezing.   Cardiovascular:  Negative for chest pain and palpitations.  Gastrointestinal:  Negative for abdominal pain, constipation, diarrhea, nausea and vomiting.  Genitourinary:  Negative for dysuria, frequency and urgency.  Musculoskeletal:  Negative for myalgias.  Neurological:  Negative for dizziness, weakness and headaches.  Psychiatric/Behavioral:  Negative for confusion.   All other systems reviewed and are negative.   Physical Exam Triage Vital Signs ED Triage Vitals [06/21/21 1700]  Enc Vitals Group     BP 121/61     Pulse Rate 71     Resp 17     Temp 97.7 F (36.5 C)     Temp Source Oral     SpO2 98 %     Weight      Height      Head Circumference      Peak Flow      Pain Score      Pain Loc      Pain Edu?      Excl. in GC?     No data found.  Updated Vital Signs BP 121/61 (BP Location: Right Arm)   Pulse 71   Temp 97.7 F (36.5 C) (Oral)   Resp 17   SpO2 98%   Visual Acuity Right Eye Distance:   Left Eye Distance:   Bilateral Distance:    Right Eye Near:   Left Eye Near:    Bilateral Near:     Physical Exam Vitals reviewed.  Constitutional:      General: She is not in acute distress.    Appearance: Normal appearance. She is not ill-appearing.  HENT:     Head: Normocephalic and atraumatic.     Right Ear: Hearing, ear canal and external ear normal. No swelling or tenderness. There is no impacted cerumen. No mastoid tenderness. Tympanic membrane is scarred. Tympanic membrane is not perforated, erythematous, retracted or bulging.     Left Ear: Hearing, ear canal and external ear normal. No swelling or tenderness. There is no impacted cerumen. No mastoid tenderness. Tympanic membrane is scarred. Tympanic membrane is not perforated, erythematous, retracted or bulging.     Nose:     Right Sinus: No maxillary sinus tenderness or frontal sinus tenderness.     Left Sinus: Maxillary sinus tenderness present. No frontal sinus tenderness.     Mouth/Throat:     Mouth: Mucous membranes are moist.     Pharynx: Uvula midline. No oropharyngeal exudate or posterior oropharyngeal erythema.     Tonsils: No tonsillar exudate.  Cardiovascular:     Rate and Rhythm: Normal rate and regular rhythm.     Heart sounds: Normal heart sounds.  Pulmonary:     Breath sounds: Normal breath sounds and air entry. No wheezing, rhonchi or rales.  Chest:     Chest wall: No tenderness.  Abdominal:     General: Abdomen is flat. Bowel sounds are normal.     Tenderness: There is no abdominal tenderness. There is no guarding or rebound.  Lymphadenopathy:     Cervical: No cervical adenopathy.  Neurological:     General: No focal deficit present.     Mental Status: She is alert and oriented to person, place, and time.  Psychiatric:         Attention and Perception: Attention and perception normal.  Mood and Affect: Mood and affect normal.        Behavior: Behavior normal. Behavior is cooperative.        Thought Content: Thought content normal.        Judgment: Judgment normal.     UC Treatments / Results  Labs (all labs ordered are listed, but only abnormal results are displayed) Labs Reviewed - No data to display  EKG   Radiology No results found.  Procedures Procedures (including critical care time)  Medications Ordered in UC Medications - No data to display  Initial Impression / Assessment and Plan / UC Course  I have reviewed the triage vital signs and the nursing notes.  Pertinent labs & imaging results that were available during my care of the patient were reviewed by me and considered in my medical decision making (see chart for details).     This patient is a very pleasant 29 y.o. year old female presenting with acute maxillary sinusitis following COVID-19 two weeks ago. Today this pt is afebrile nontachycardic nontachypneic, oxygenating well on room air, no wheezes rhonchi or rales. States she is not pregnant. Augmentin as below. ED return precautions discussed. Patient verbalizes understanding and agreement.     Final Clinical Impressions(s) / UC Diagnoses   Final diagnoses:  Acute non-recurrent maxillary sinusitis  History of COVID-19     Discharge Instructions      -Start the antibiotic-Augmentin (amoxicillin-clavulanate), 1 pill every 12 hours for 7 days.  You can take this with food like with breakfast and dinner. -Continue over-the-counter medications for symptomatic relief.     ED Prescriptions     Medication Sig Dispense Auth. Provider   amoxicillin-clavulanate (AUGMENTIN) 875-125 MG tablet Take 1 tablet by mouth every 12 (twelve) hours. 14 tablet Rhys Martini, PA-C      PDMP not reviewed this encounter.   Rhys Martini, PA-C 06/21/21 1718

## 2021-06-21 NOTE — ED Triage Notes (Signed)
Pt reports nasal congestion and ;eft sided facial pressure x 2 weeks. Mucinex, NyQuil and Sudafed gives no relief.   Pt reports she had COVID 2 weeks ago.

## 2021-06-25 ENCOUNTER — Ambulatory Visit (INDEPENDENT_AMBULATORY_CARE_PROVIDER_SITE_OTHER): Payer: No Typology Code available for payment source

## 2021-06-25 ENCOUNTER — Ambulatory Visit (INDEPENDENT_AMBULATORY_CARE_PROVIDER_SITE_OTHER): Payer: No Typology Code available for payment source | Admitting: Orthopedic Surgery

## 2021-06-25 ENCOUNTER — Encounter: Payer: Self-pay | Admitting: Orthopedic Surgery

## 2021-06-25 VITALS — Ht 64.0 in | Wt 134.0 lb

## 2021-06-25 DIAGNOSIS — M25562 Pain in left knee: Secondary | ICD-10-CM | POA: Diagnosis not present

## 2021-06-27 NOTE — Progress Notes (Addendum)
Office Visit Note   Patient: Angelica Norman           Date of Birth: [birth year]           MRN: [id number] Visit Date: 06/25/2021 Requested by: [place], Betty G, MD [address] [place],  [place] [address] PCP: Sw[place], Betty G, MD  Subjective: Chief Complaint  Patient presents with   Right Knee - Pain    HPI: Angelica Norman is a 30 y.o. female who presents to the office complaining of right knee pain.  Patient has a history of right knee pain over the last 2 years that has intermittent based on her activity.  She denies any specific injury to the knee.  Localizes pain to the lateral aspect of the knee.  Denies any swelling or effusion at all.  He does take Advil as needed and did physical therapy which provided temporary relief.  Denies any history of injury and has never had injection or surgery to the knee.  No significant medical history.  She is gone to the point where even walking causes pain if she is very active.  This feels like it is worsening progressively.  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  In her free time she enjoys power lifting and yoga but she has had to back off of that more in the last several months especially since she had her MRI scan.  She has had MRI scan of the right knee that was ordered by Dr. 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  She denies any significant family history of DVT/PE though she does note her maternal grandfather had PE near end-of-life while bedbound.              ROS: All systems reviewed are negative as they relate to the chief complaint within the history of present illness.  Patient denies fevers or chills.  Assessment & Plan: Visit Diagnoses:  1. Left knee pain, unspecified  chronicity     Plan: Patient is a 30 year old female who presents for evaluation of right knee pain.  She has had on and off knee pain over the last 2 years with recent MRI ordered by Dr. 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  She has some degenerative tearing of the lateral meniscus as well.  She has progressively worsening right knee pain but not much in the way of positive findings on exam with no significant joint line tenderness or any effusion that is present.  She is a very active individual and enjoys her job as an Acupuncturist as well as her leisure pursuits including yoga and power lifting.  Discussed the MRI findings as well as options available to patient.  After lengthy discussion, patient would like to proceed with osteochondral allograft.  She understands that there will be a period of nonweightbearing and she will have restricted activities for 4 to 6 months after the procedure.  She also understands the risks of the surgery including risk of nerve/vessel damage, knee stiffness, continued pain, medical complication from surgery such as DVT/PE, need for revision surgery.  She will be posted for surgery depending on the timeline for the allograft. Incidental note is also made in the left knee of smaller osteochondral  defect which we can image as well but this does not appear to be symptomatic at this time.  Ordered MRI of the left knee for further evaluation.  Follow-Up Instructions: No follow-ups on file.   Orders:  Orders Placed This Encounter  Procedures   XR KNEE 3 VIEW LEFT   MR Knee Left w/o contrast   No orders of the defined types were placed in this encounter.     Procedures: No procedures performed   Clinical Data: No additional findings.  Objective: Vital Signs: Ht 5\' 4"  (1.626 m)   Wt 134 lb (60.8 kg)   BMI 23.00 kg/m   Physical Exam:  Constitutional: Patient appears well-developed HEENT:  Head:  Normocephalic Eyes:EOM are normal Neck: Normal range of motion Cardiovascular: Normal rate Pulmonary/chest: Effort normal Neurologic: Patient is alert Skin: Skin is warm Psychiatric: Patient has normal mood and affect  Ortho Exam: Ortho exam demonstrates minimal tenderness over the lateral joint line.  No effusion is present.  Excellent range of motion from 0 to greater than 120 degrees.  No calf tenderness.  Negative Homans' sign.  Excellent quadricep strength and able to perform multiple straight leg raises without difficulty.  No tenderness over the medial joint line.  No laxity to MCL or LCL at 0 or 30 degrees.  No laxity to anterior/posterior drawer.  Negative Lachman exam.  Specialty Comments:  No specialty comments available.  Imaging: No results found.   PMFS History: Patient Active Problem List   Diagnosis Date Noted   Thyroid nodule 07/17/2019   Eczema 07/19/2017   Allergic rhinitis 07/19/2017   Past Medical History:  Diagnosis Date   Abnormal Pap smear of cervix 07/2015   Neg/Pos HR HPV--in [place]--no colpo/no treatment   Allergy    Eczema    Thyroid nodule 07/2018    Family History  Problem Relation Age of Onset   Arthritis Mother    Hyperlipidemia Mother    Diabetes Father    Cancer Father        prostate   Thyroid disease Father        hypothyroid   Heart disease Maternal Grandmother    Hyperlipidemia Maternal Grandmother    Stroke Maternal Grandmother    Stroke Paternal Grandmother    Hypertension Paternal Grandmother    Cancer Paternal Grandfather 35       Dec colon cancer    History reviewed. No pertinent surgical history. Social History   Occupational History   Not on file  Tobacco Use   Smoking status: Never   Smokeless tobacco: Never  Vaping Use   Vaping Use: Never used  Substance and Sexual Activity   Alcohol use: Yes    Alcohol/week: 3.0 standard drinks    Types: 3 Cans of beer per week   Drug use: No   Sexual activity: Yes     Partners: Male    Birth control/protection: Implant    Comment: Nexplanon inserted 10-26-14/Lt.arm

## 2021-06-29 ENCOUNTER — Encounter: Payer: Self-pay | Admitting: Orthopedic Surgery

## 2021-06-30 ENCOUNTER — Encounter: Payer: Self-pay | Admitting: Orthopedic Surgery

## 2021-06-30 NOTE — Telephone Encounter (Signed)
Thanks for the info.

## 2021-07-03 ENCOUNTER — Telehealth: Payer: Self-pay | Admitting: Orthopedic Surgery

## 2021-07-03 NOTE — Telephone Encounter (Signed)
[employer] forms received. To [employer]. 

## 2021-07-27 ENCOUNTER — Other Ambulatory Visit: Payer: Self-pay

## 2021-07-27 ENCOUNTER — Ambulatory Visit
Admission: RE | Admit: 2021-07-27 | Discharge: 2021-07-27 | Disposition: A | Discharge status: Home / Self Care | Payer: No Typology Code available for payment source | Source: Ambulatory Visit | Attending: Orthopedic Surgery | Admitting: Orthopedic Surgery

## 2021-07-27 DIAGNOSIS — M25562 Pain in left knee: Secondary | ICD-10-CM

## 2021-07-27 IMAGING — MR MR KNEE*L* W/O CM
5 of 7 series · 26 of 40 positions shown · non-contrast
Comparison: None.

CLINICAL DATA: Left knee pain. No prior surgery.

EXAM:
MRI OF THE LEFT KNEE WITHOUT CONTRAST
TECHNIQUE: Multiplanar, multisequence MR imaging of the knee was performed. No
intravenous contrast was administered.

[Series 4: T2 fat-sat · coronal · 4.0mm · 0.55mm/px · 5 of 22 slices shown (1 of 3)]
[im 1/22]
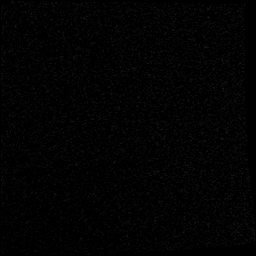
[im 6/22]
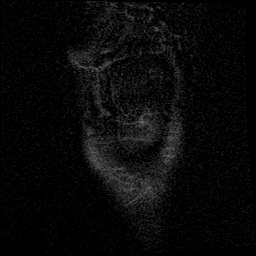
[im 11/22]
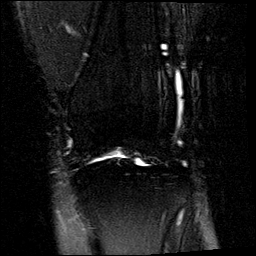
[im 16/22]
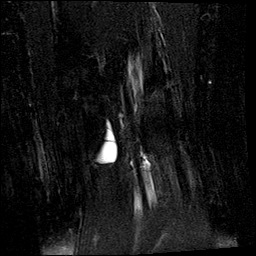
[im 22/22]
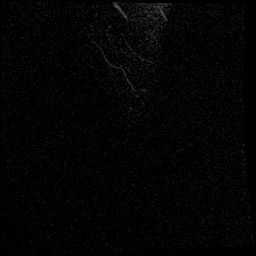

[Series 5: T1 · coronal · 4.0mm · 0.27mm/px · 2 of 26 slices shown]
[im 1/26]
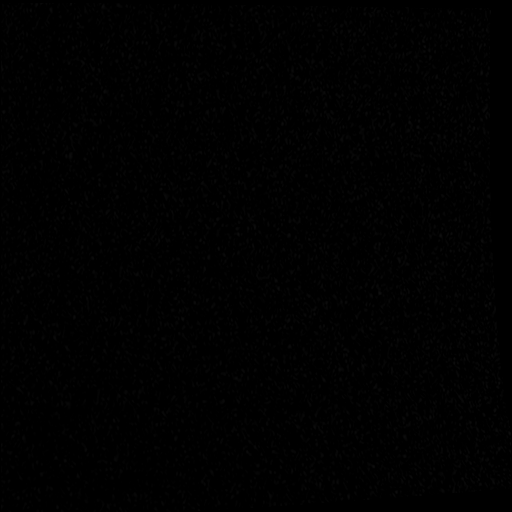
[im 7/26]
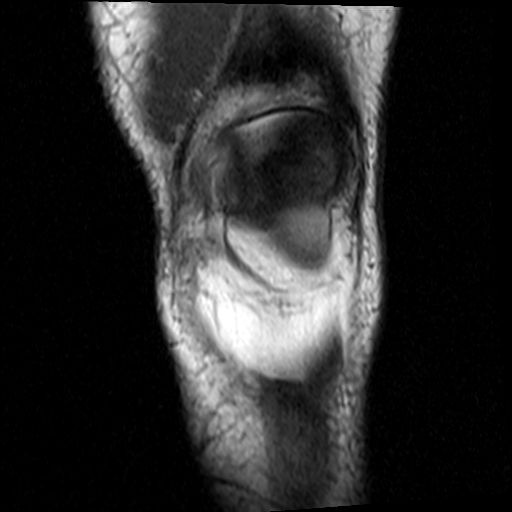

[Series 7: PD fat-sat · sagittal · 3.0mm · 0.29mm/px · 7 of 32 slices shown]
[im 1/32]
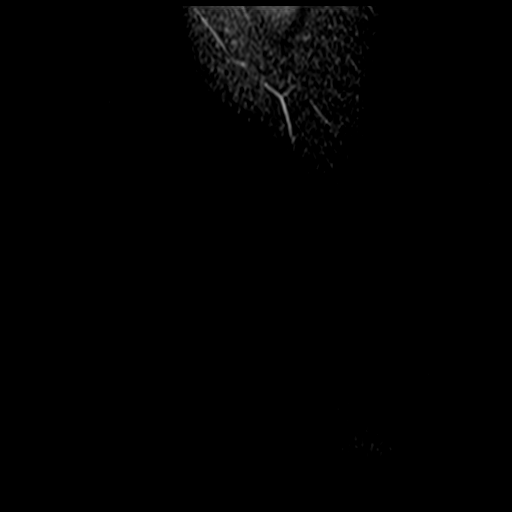
[im 6/32]
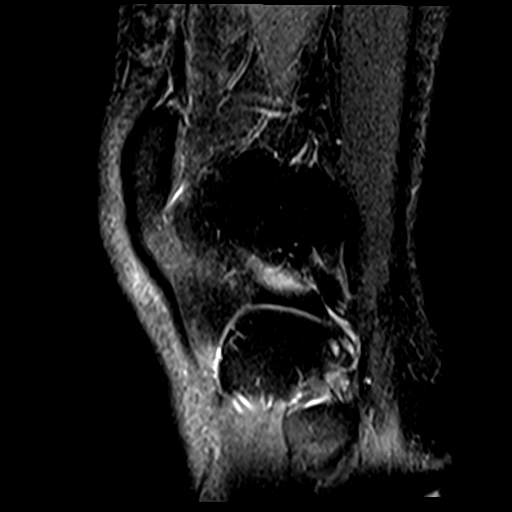
[im 11/32]
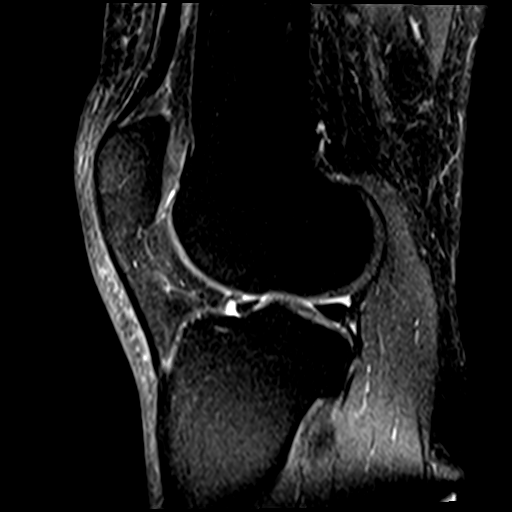
[im 16/32]
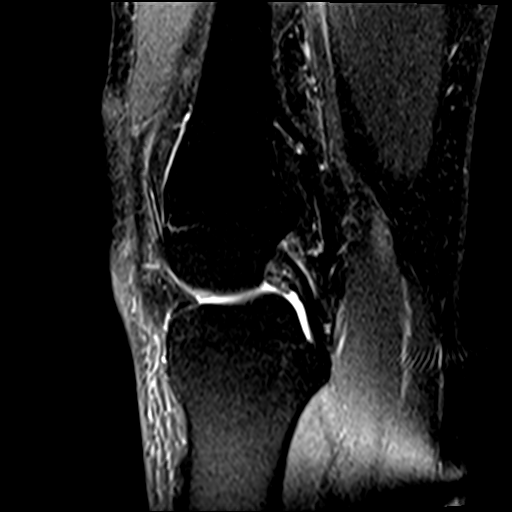
[im 21/32]
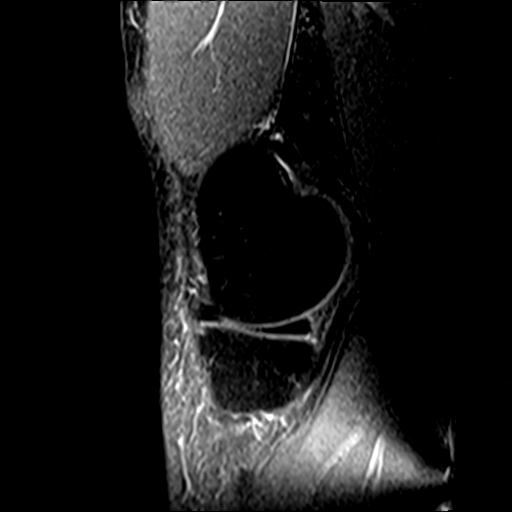
[im 26/32]
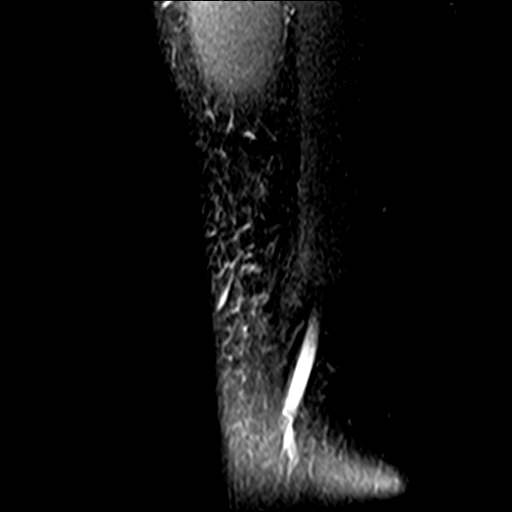
[im 32/32]
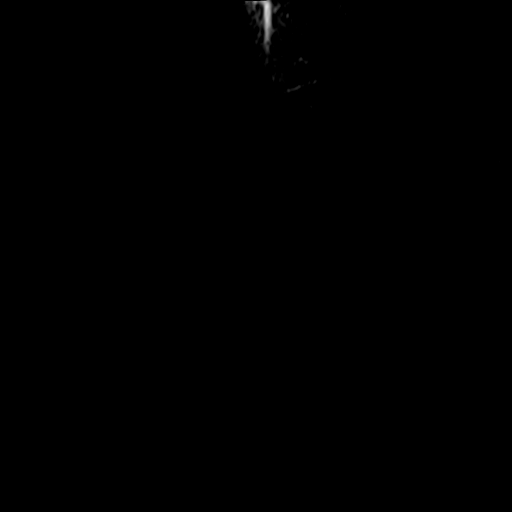

[Series 8: T2 fat-sat · sagittal · 3.0mm · 0.29mm/px · 7 of 32 slices shown (2 of 3)]
[im 1/32]
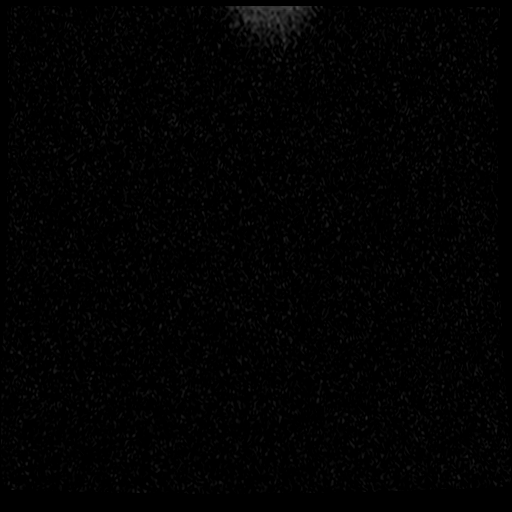
[im 6/32]
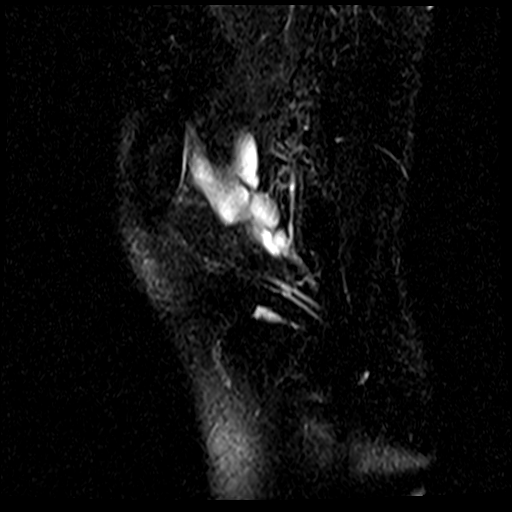
[im 11/32]
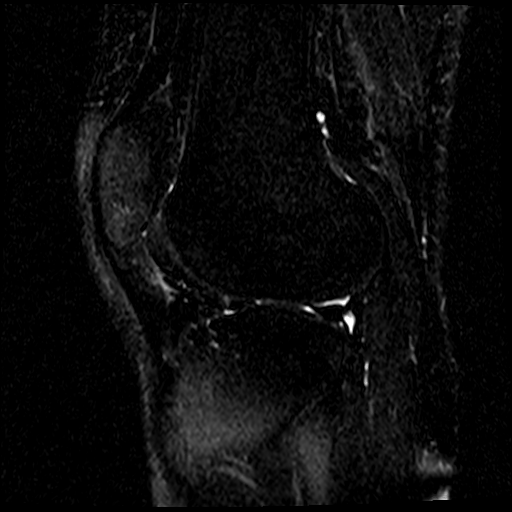
[im 16/32]
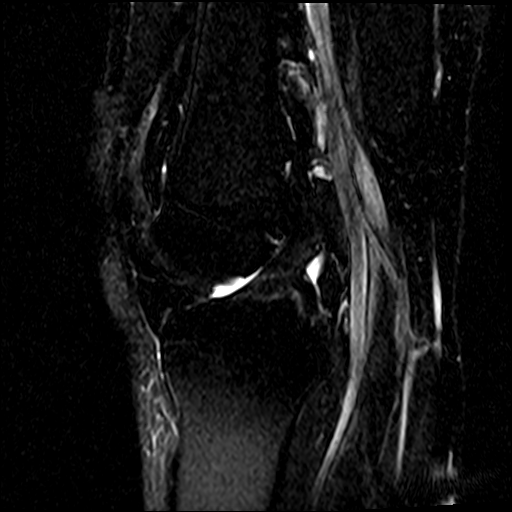
[im 21/32]
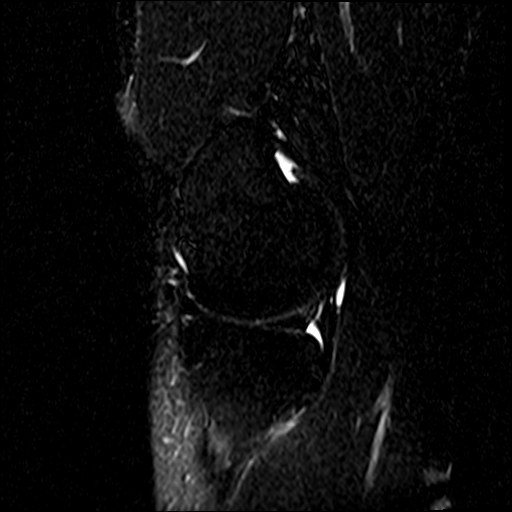
[im 26/32]
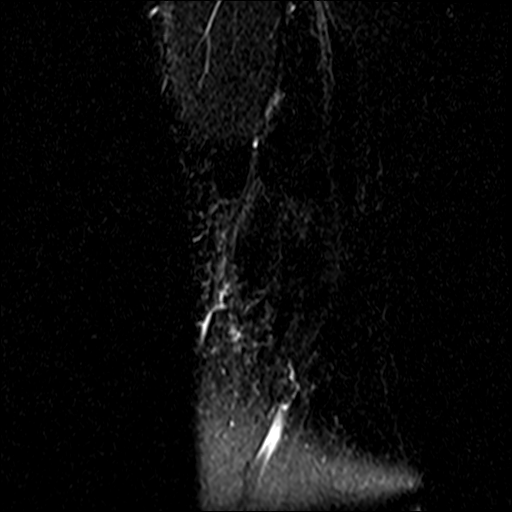
[im 32/32]
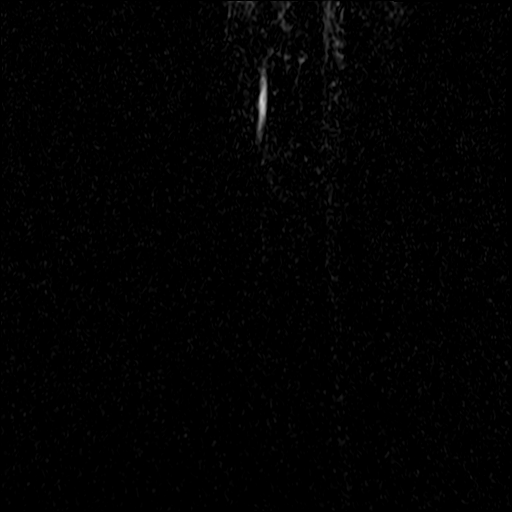

[Series 9: T2 fat-sat · coronal · 4.0mm · 0.55mm/px · 5 of 25 slices shown (3 of 3)]
[im 1/25]
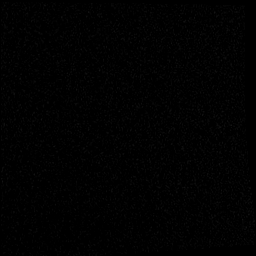
[im 7/25]
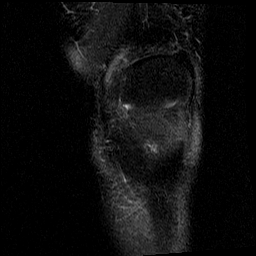
[im 13/25]
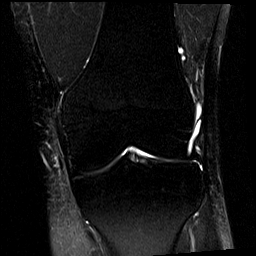
[im 19/25]
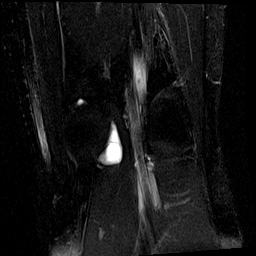
[im 25/25]
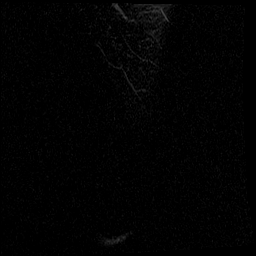

[26 of 40 positions shown; findings below may reference images not displayed]

FINDINGS: MENISCI

Medial: Intact.

Lateral: Intact.

LIGAMENTS

Cruciates: ACL and PCL are intact.

Collaterals: Medial collateral ligament is intact. Lateral
collateral ligament complex is intact.

CARTILAGE

Patellofemoral:  No chondral defect.

Medial:  No chondral defect.

Lateral:  No chondral defect.

JOINT: No joint effusion. Normal CHARLES ALAIN. No plical
thickening.

POPLITEAL FOSSA: Popliteus tendon is intact. No Baker's cyst.

EXTENSOR MECHANISM: Intact quadriceps tendon. Intact patellar
tendon. Intact lateral patellar retinaculum. Intact medial patellar
retinaculum. Intact MPFL.

BONES: No aggressive osseous lesion. No fracture or dislocation.

Other: No fluid collection or hematoma. Muscles are normal.
IMPRESSION: 1. No internal derangement of the left knee.
2.  No acute osseous injury of the left knee.

## 2021-07-30 ENCOUNTER — Encounter: Payer: Self-pay | Admitting: Orthopedic Surgery

## 2021-08-06 ENCOUNTER — Ambulatory Visit: Payer: No Typology Code available for payment source | Admitting: Orthopedic Surgery

## 2021-09-02 ENCOUNTER — Other Ambulatory Visit: Payer: Self-pay

## 2021-09-02 ENCOUNTER — Encounter: Payer: Self-pay | Admitting: Nurse Practitioner

## 2021-09-02 ENCOUNTER — Ambulatory Visit (INDEPENDENT_AMBULATORY_CARE_PROVIDER_SITE_OTHER): Payer: No Typology Code available for payment source | Admitting: Nurse Practitioner

## 2021-09-02 VITALS — BP 116/74

## 2021-09-02 DIAGNOSIS — Z3009 Encounter for other general counseling and advice on contraception: Secondary | ICD-10-CM | POA: Diagnosis not present

## 2021-09-02 NOTE — Progress Notes (Signed)
   Acute Office Visit  Subjective:    Patient ID: Angelica Norman, female    DOB: November 18, 1991, 30 y.o.   MRN: [id number]   HPI 30 y.o. presents today for IUD removal and to discuss other contraceptive options. She is planning to have an orthopedic surgery on her right knee and was advised to stop contraception until 6 weeks after surgery. She has Mirena IUD.   Review of Systems  Constitutional: Negative.   Genitourinary: Negative.       Objective:    Physical Exam Constitutional:      Appearance: Normal appearance.  GU: not indicated  BP 116/74  Wt Readings from Last 3 Encounters:  06/25/21 134 lb (60.8 kg)  05/26/21 139 lb (63 kg)  05/20/21 139 lb (63 kg)        Assessment & Plan:   Problem List Items Addressed This Visit   None Visit Diagnoses     Encounter for other general counseling or advice on contraception    -  Primary      Plan: We discussed safety profile of Mirena IUD and no increased risk for blood clots. Recommend continuing current contraceptive use. She is agreeable to plan.      Olivia Mackie DNP, 8:05 AM 09/03/2021

## 2021-09-03 ENCOUNTER — Encounter: Payer: Self-pay | Admitting: Nurse Practitioner

## 2021-09-03 ENCOUNTER — Encounter: Payer: Self-pay | Admitting: Orthopedic Surgery

## 2021-09-29 ENCOUNTER — Telehealth: Payer: Self-pay

## 2021-09-29 NOTE — Telephone Encounter (Signed)
FYI-Peer to peer is set up for 10/01/21. No specific time. Let the UR nurse know your clinic hours. Gave your cell # and triage # as the contact #'s.   The reference number for the case is [phone] is you should need it

## 2021-10-01 NOTE — Telephone Encounter (Signed)
Done and approved he did not give me any number though

## 2021-10-02 ENCOUNTER — Other Ambulatory Visit: Payer: Self-pay

## 2021-10-06 ENCOUNTER — Telehealth: Payer: Self-pay | Admitting: Orthopedic Surgery

## 2021-10-06 NOTE — Telephone Encounter (Signed)
[employer] forms received. To [employer]. 

## 2021-10-08 NOTE — Progress Notes (Signed)
Surgical Instructions    Your procedure is scheduled on Thursday, November 10th.  Report to [facility] Main Entrance "A" at 5:30 A.M., then check in with the Admitting office.  Call this number if you have problems the morning of surgery:  [phone]   If you have any questions prior to your surgery date call [phone]: Open Monday-Friday 8am-4pm    Remember:  Do not eat after midnight the night before your surgery  You may drink clear liquids until 4:30 AM the morning of your surgery.   Clear liquids allowed are: Water, Non-Citrus Juices (without pulp), Carbonated Beverages, Clear Tea, Black Coffee ONLY (NO MILK, CREAM OR POWDERED CREAMER of any kind), and Gatorade    Take these medicines the morning of surgery with A SIP OF WATER    If needed:  Zyrtec  Flonase nasal spray  Eye drops  Senna (Senokot)  As of today, STOP taking any Aspirin (unless otherwise instructed by your surgeon) Aleve, Naproxen, Ibuprofen, Motrin, Advil, Goody's, BC's, all herbal medications, fish oil, and all vitamins.  DAY OF SURGERY:         Do not wear jewelry, makeup, or nail polish Do not wear lotions, powders, perfumes, or deodorant. Do not shave 48 hours prior to surgery.  Do not bring valuables to the hospital.             [facility] is not responsible for any belongings or valuables.  Do NOT Smoke (Tobacco/Vaping)  24 hours prior to your procedure  If you use a CPAP at night, you may bring your mask for your overnight stay.   Contacts, glasses, hearing aids, dentures or partials may not be worn into surgery, please bring cases for these belongings   For patients admitted to the hospital, discharge time will be determined by your treatment team.   Patients discharged the day of surgery will not be allowed to drive home, and someone needs to stay with them for 24 hours.  NO VISITORS WILL BE ALLOWED IN PRE-OP WHERE PATIENTS ARE PREPPED FOR SURGERY.  ONLY 1 SUPPORT PERSON MAY BE PRESENT  IN THE WAITING ROOM WHILE YOU ARE IN SURGERY.  IF YOU ARE TO BE ADMITTED, ONCE YOU ARE IN YOUR ROOM YOU WILL BE ALLOWED TWO (2) VISITORS. 1 (ONE) VISITOR MAY STAY OVERNIGHT BUT MUST ARRIVE TO THE ROOM BY 8pm.  Minor children may have two parents present. Special consideration for safety and communication needs will be reviewed on a case by case basis.  Special instructions:    Oral Hygiene is also important to reduce your risk of infection.  Remember - BRUSH YOUR TEETH THE MORNING OF SURGERY WITH YOUR REGULAR TOOTHPASTE   [facility]- Preparing For Surgery  Before surgery, you can play an important role. Because skin is not sterile, your skin needs to be as free of germs as possible. You can reduce the number of germs on your skin by washing with CHG (chlorahexidine gluconate) Soap before surgery.  CHG is an antiseptic cleaner which kills germs and bonds with the skin to continue killing germs even after washing.     Please do not use if you have an allergy to CHG or antibacterial soaps. If your skin becomes reddened/irritated stop using the CHG.  Do not shave (including legs and underarms) for at least 48 hours prior to first CHG shower. It is OK to shave your face.  Please follow these instructions carefully.     Shower the [employer] [facility] and  the MORNING OF SURGERY with CHG Soap.   If you chose to wash your hair, wash your hair first as usual with your normal shampoo. After you shampoo, rinse your hair and body thoroughly to remove the shampoo.  Then [employer] and genitals (private parts) with your normal soap and rinse thoroughly to remove soap.  After that Use CHG Soap as you would any other liquid soap. You can apply CHG directly to the skin and wash gently with a scrungie or a clean washcloth.   Apply the CHG Soap to your body ONLY FROM THE NECK DOWN.  Do not use on open wounds or open sores. Avoid contact with your eyes, ears, mouth and genitals (private parts). Wash Face and  genitals (private parts)  with your normal soap.   Wash thoroughly, paying special attention to the area where your surgery will be performed.  Thoroughly rinse your body with warm water from the neck down.  DO NOT shower/wash with your normal soap after using and rinsing off the CHG Soap.  Pat yourself dry with a CLEAN TOWEL.  Wear CLEAN PAJAMAS to bed the night before surgery  Place CLEAN SHEETS on your bed the night before your surgery  DO NOT SLEEP WITH PETS.   Day of Surgery:  Take a shower with CHG soap. Wear Clean/Comfortable clothing the morning of surgery Do not apply any deodorants/lotions.   Remember to brush your teeth WITH YOUR REGULAR TOOTHPASTE.   Please read over the following fact sheets that you were given.

## 2021-10-09 ENCOUNTER — Other Ambulatory Visit: Payer: Self-pay

## 2021-10-09 ENCOUNTER — Encounter (HOSPITAL_COMMUNITY): Payer: Self-pay

## 2021-10-09 ENCOUNTER — Encounter (HOSPITAL_COMMUNITY)
Admission: RE | Admit: 2021-10-09 | Discharge: 2021-10-09 | Disposition: A | Discharge status: Home / Self Care | Payer: No Typology Code available for payment source | Source: Ambulatory Visit | Attending: Orthopedic Surgery | Admitting: Orthopedic Surgery

## 2021-10-09 DIAGNOSIS — Z01812 Encounter for preprocedural laboratory examination: Secondary | ICD-10-CM | POA: Diagnosis present

## 2021-10-09 DIAGNOSIS — Z01818 Encounter for other preprocedural examination: Secondary | ICD-10-CM

## 2021-10-09 HISTORY — DX: Family history of other specified conditions: Z84.89

## 2021-10-09 LAB — CBC
HCT: 38.6 % (ref 36.0–46.0)
Hemoglobin: 13.1 g/dL (ref 12.0–15.0)
MCH: 32.3 pg (ref 26.0–34.0)
MCHC: 33.9 g/dL (ref 30.0–36.0)
MCV: 95.1 fL (ref 80.0–100.0)
Platelets: 240 10*3/uL (ref 150–400)
RBC: 4.06 MIL/uL (ref 3.87–5.11)
RDW: 11.5 % (ref 11.5–15.5)
WBC: 6.5 10*3/uL (ref 4.0–10.5)
nRBC: 0 % (ref 0.0–0.2)

## 2021-10-09 LAB — BASIC METABOLIC PANEL
Anion gap: 7 (ref 5–15)
BUN: 12 mg/dL (ref 6–20)
CO2: 28 mmol/L (ref 22–32)
Calcium: 9.1 mg/dL (ref 8.9–10.3)
Chloride: 103 mmol/L (ref 98–111)
Creatinine, Ser: 0.61 mg/dL (ref 0.44–1.00)
GFR, Estimated: >60 mL/min (ref >60)
Glucose, Bld: 101 mg/dL — ABNORMAL HIGH (ref 70–99)
Potassium: 3.8 mmol/L (ref 3.5–5.1)
Sodium: 138 mmol/L (ref 135–145)

## 2021-10-09 NOTE — Progress Notes (Signed)
PCP - Dr. Betty Sw[place]iland Cardiologist - pt denies  PPM/ICD - n/a  Chest x-ray - n/a EKG - n/a Stress Test -  pt denies ECHO -  pt denies Cardiac Cath -  pt denies  Sleep Study -  pt denies  Blood Thinner Instructions: n/a Aspirin Instructions: As of today, STOP taking any Aspirin (unless otherwise instructed by your surgeon) Aleve, Naproxen, Ibuprofen, Motrin, Advil, Goody's, BC's, all herbal medications, fish oil, and all vitamins.  ERAS Protcol - yes PRE-SURGERY Ensure or G2- ensure  COVID TEST- n/a; ambulatory surgery  Anesthesia review: not at this time  Patient denies shortness of breath, fever, cough and chest pain at PAT appointment   All instructions explained to the patient, with a verbal understanding of the material. Patient agrees to go over the instructions while at home for a better understanding. Patient also instructed to self quarantine after being tested for COVID-19. The opportunity to ask questions was provided.

## 2021-10-16 ENCOUNTER — Encounter (HOSPITAL_COMMUNITY)
Admission: RE | Disposition: A | Discharge status: Home / Self Care | Payer: Self-pay | Source: Home / Self Care | Attending: Orthopedic Surgery

## 2021-10-16 ENCOUNTER — Other Ambulatory Visit (HOSPITAL_COMMUNITY): Payer: Self-pay

## 2021-10-16 ENCOUNTER — Ambulatory Visit (HOSPITAL_COMMUNITY): Payer: No Typology Code available for payment source | Admitting: Certified Registered Nurse Anesthetist

## 2021-10-16 ENCOUNTER — Other Ambulatory Visit: Payer: Self-pay

## 2021-10-16 ENCOUNTER — Ambulatory Visit (HOSPITAL_COMMUNITY)
Admission: RE | Admit: 2021-10-16 | Discharge: 2021-10-16 | Disposition: A | Discharge status: Home / Self Care | Payer: No Typology Code available for payment source | Attending: Orthopedic Surgery | Admitting: Orthopedic Surgery

## 2021-10-16 ENCOUNTER — Encounter (HOSPITAL_COMMUNITY): Payer: Self-pay | Admitting: Orthopedic Surgery

## 2021-10-16 DIAGNOSIS — M93261 Osteochondritis dissecans, right knee: Secondary | ICD-10-CM | POA: Insufficient documentation

## 2021-10-16 DIAGNOSIS — M93262 Osteochondritis dissecans, left knee: Secondary | ICD-10-CM

## 2021-10-16 DIAGNOSIS — M9320 Osteochondritis dissecans of unspecified site: Secondary | ICD-10-CM

## 2021-10-16 DIAGNOSIS — Z01818 Encounter for other preprocedural examination: Secondary | ICD-10-CM

## 2021-10-16 HISTORY — PX: OSTEOCHONDRAL DEFECT REPAIR/RECONSTRUCTION: SHX6232

## 2021-10-16 HISTORY — PX: HARVEST BONE GRAFT: SHX377

## 2021-10-16 LAB — POCT PREGNANCY, URINE: Preg Test, Ur: NEGATIVE

## 2021-10-16 SURGERY — APPLICATION, GRAFT, OSTEOCHONDRAL, KNEE
Anesthesia: General | Site: Knee | Laterality: Right

## 2021-10-16 MED ORDER — ASPIRIN 81 MG PO CHEW
81.0000 mg | CHEWABLE_TABLET | Freq: Every day | ORAL | 0 refills | Status: AC
  Filled 2021-10-16: qty 30, 30d supply, fill #0

## 2021-10-16 MED ORDER — CLONIDINE HCL (ANALGESIA) 100 MCG/ML EP SOLN
EPIDURAL | Status: DC | PRN
  Administered 2021-10-16: 50 ug

## 2021-10-16 MED ORDER — LIDOCAINE 2% (20 MG/ML) 5 ML SYRINGE
INTRAMUSCULAR | Status: AC
  Filled 2021-10-16: qty 5

## 2021-10-16 MED ORDER — CLONIDINE HCL (ANALGESIA) 100 MCG/ML EP SOLN
EPIDURAL | Status: DC | PRN
  Administered 2021-10-16: 1 mL

## 2021-10-16 MED ORDER — PHENYLEPHRINE 40 MCG/ML (10ML) SYRINGE FOR IV PUSH (FOR BLOOD PRESSURE SUPPORT)
PREFILLED_SYRINGE | INTRAVENOUS | Status: DC | PRN
  Administered 2021-10-16: 40 ug via INTRAVENOUS
  Administered 2021-10-16: 160 ug via INTRAVENOUS
  Administered 2021-10-16 (×2): 80 ug via INTRAVENOUS

## 2021-10-16 MED ORDER — TRANEXAMIC ACID-NACL 1000-0.7 MG/100ML-% IV SOLN
INTRAVENOUS | Status: AC
  Filled 2021-10-16: qty 100

## 2021-10-16 MED ORDER — LIDOCAINE 2% (20 MG/ML) 5 ML SYRINGE
INTRAMUSCULAR | Status: DC | PRN
  Administered 2021-10-16: 20 mg via INTRAVENOUS

## 2021-10-16 MED ORDER — CLONIDINE HCL (ANALGESIA) 100 MCG/ML EP SOLN
EPIDURAL | Status: AC
  Filled 2021-10-16: qty 10

## 2021-10-16 MED ORDER — DEXMEDETOMIDINE (PRECEDEX) IN NS 20 MCG/5ML (4 MCG/ML) IV SYRINGE
PREFILLED_SYRINGE | INTRAVENOUS | Status: DC | PRN
  Administered 2021-10-16: 16 ug via INTRAVENOUS

## 2021-10-16 MED ORDER — DEXAMETHASONE SODIUM PHOSPHATE 4 MG/ML IJ SOLN
INTRAMUSCULAR | Status: DC | PRN
  Administered 2021-10-16: 10 mg via INTRAVENOUS

## 2021-10-16 MED ORDER — MORPHINE SULFATE (PF) 4 MG/ML IV SOLN
INTRAVENOUS | Status: AC
  Filled 2021-10-16: qty 2

## 2021-10-16 MED ORDER — MIDAZOLAM HCL 5 MG/5ML IJ SOLN
INTRAMUSCULAR | Status: DC | PRN
  Administered 2021-10-16: 2 mg via INTRAVENOUS

## 2021-10-16 MED ORDER — PHENYLEPHRINE 40 MCG/ML (10ML) SYRINGE FOR IV PUSH (FOR BLOOD PRESSURE SUPPORT)
PREFILLED_SYRINGE | INTRAVENOUS | Status: AC
  Filled 2021-10-16: qty 10

## 2021-10-16 MED ORDER — ORAL CARE MOUTH RINSE: 15.0000 mL | Freq: Once | OROMUCOSAL | Status: AC

## 2021-10-16 MED ORDER — ROPIVACAINE HCL 5 MG/ML IJ SOLN
INTRAMUSCULAR | Status: DC | PRN
  Administered 2021-10-16: 25 mL via PERINEURAL

## 2021-10-16 MED ORDER — HEPARIN SODIUM (PORCINE) 1000 UNIT/ML IJ SOLN
6000.0000 [IU] | INTRAMUSCULAR | Status: DC
  Filled 2021-10-16: qty 6

## 2021-10-16 MED ORDER — SODIUM CHLORIDE 0.9 % IR SOLN
Status: DC | PRN
  Administered 2021-10-16: 3000 mL

## 2021-10-16 MED ORDER — EPHEDRINE SULFATE 50 MG/ML IJ SOLN
INTRAMUSCULAR | Status: DC | PRN
  Administered 2021-10-16: 15 mg via INTRAVENOUS
  Administered 2021-10-16: 10 mg via INTRAVENOUS

## 2021-10-16 MED ORDER — BUPIVACAINE LIPOSOME 1.3 % IJ SUSP
INTRAMUSCULAR | Status: DC | PRN
  Administered 2021-10-16: 20 mL

## 2021-10-16 MED ORDER — FENTANYL CITRATE (PF) 100 MCG/2ML IJ SOLN: 25.0000 ug | INTRAMUSCULAR | Status: DC | PRN

## 2021-10-16 MED ORDER — TRANEXAMIC ACID 1000 MG/10ML IV SOLN
2000.0000 mg | INTRAVENOUS | Status: DC
  Filled 2021-10-16: qty 20

## 2021-10-16 MED ORDER — LACTATED RINGERS IV SOLN: INTRAVENOUS | Status: DC

## 2021-10-16 MED ORDER — MIDAZOLAM HCL 2 MG/2ML IJ SOLN
INTRAMUSCULAR | Status: AC
  Filled 2021-10-16: qty 2

## 2021-10-16 MED ORDER — LACTATED RINGERS IV SOLN: INTRAVENOUS | Status: DC | PRN

## 2021-10-16 MED ORDER — CELECOXIB 100 MG PO CAPS
100.0000 mg | ORAL_CAPSULE | Freq: Two times a day (BID) | ORAL | 0 refills | Status: DC
  Filled 2021-10-16: qty 60, 30d supply, fill #0

## 2021-10-16 MED ORDER — TRANEXAMIC ACID-NACL 1000-0.7 MG/100ML-% IV SOLN
1000.0000 mg | INTRAVENOUS | Status: AC
  Administered 2021-10-16: 1000 mg via INTRAVENOUS
  Filled 2021-10-16: qty 100

## 2021-10-16 MED ORDER — POVIDONE-IODINE 7.5 % EX SOLN
Freq: Once | CUTANEOUS | Status: DC
  Filled 2021-10-16: qty 118

## 2021-10-16 MED ORDER — POVIDONE-IODINE 10 % EX SWAB
2.0000 "application " | Freq: Once | CUTANEOUS | Status: AC
  Administered 2021-10-16: 2 via TOPICAL

## 2021-10-16 MED ORDER — FENTANYL CITRATE (PF) 250 MCG/5ML IJ SOLN
INTRAMUSCULAR | Status: AC
  Filled 2021-10-16: qty 5

## 2021-10-16 MED ORDER — ACETAMINOPHEN 500 MG PO TABS
1000.0000 mg | ORAL_TABLET | Freq: Once | ORAL | Status: AC
  Administered 2021-10-16: 1000 mg via ORAL
  Filled 2021-10-16: qty 2

## 2021-10-16 MED ORDER — MORPHINE SULFATE (PF) 4 MG/ML IV SOLN
INTRAVENOUS | Status: DC | PRN
  Administered 2021-10-16: 8 mg

## 2021-10-16 MED ORDER — BUPIVACAINE HCL (PF) 0.25 % IJ SOLN
INTRAMUSCULAR | Status: AC
  Filled 2021-10-16: qty 30

## 2021-10-16 MED ORDER — SODIUM CHLORIDE (PF) 0.9 % IJ SOLN: 6.0000 mL | Freq: Once | INTRAMUSCULAR | Status: DC

## 2021-10-16 MED ORDER — METHOCARBAMOL 500 MG PO TABS
500.0000 mg | ORAL_TABLET | Freq: Three times a day (TID) | ORAL | 0 refills | Status: DC | PRN
  Filled 2021-10-16: qty 30, 10d supply, fill #0

## 2021-10-16 MED ORDER — DEXMEDETOMIDINE (PRECEDEX) IN NS 20 MCG/5ML (4 MCG/ML) IV SYRINGE
PREFILLED_SYRINGE | INTRAVENOUS | Status: AC
  Filled 2021-10-16: qty 10

## 2021-10-16 MED ORDER — IRRISEPT - 450ML BOTTLE WITH 0.05% CHG IN STERILE WATER, USP 99.95% OPTIME
TOPICAL | Status: DC | PRN
  Administered 2021-10-16: 450 mL

## 2021-10-16 MED ORDER — BUPIVACAINE LIPOSOME 1.3 % IJ SUSP
INTRAMUSCULAR | Status: AC
  Filled 2021-10-16: qty 20

## 2021-10-16 MED ORDER — FENTANYL CITRATE (PF) 100 MCG/2ML IJ SOLN
INTRAMUSCULAR | Status: DC | PRN
  Administered 2021-10-16: 250 ug via INTRAVENOUS
  Administered 2021-10-16: 100 ug via INTRAVENOUS

## 2021-10-16 MED ORDER — BUPIVACAINE HCL 0.25 % IJ SOLN
INTRAMUSCULAR | Status: DC | PRN
  Administered 2021-10-16: 30 mL

## 2021-10-16 MED ORDER — AMISULPRIDE (ANTIEMETIC) 5 MG/2ML IV SOLN: 10.0000 mg | Freq: Once | INTRAVENOUS | Status: DC | PRN

## 2021-10-16 MED ORDER — EPHEDRINE 5 MG/ML INJ
INTRAVENOUS | Status: AC
  Filled 2021-10-16: qty 5

## 2021-10-16 MED ORDER — OXYCODONE-ACETAMINOPHEN 5-325 MG PO TABS
1.0000 | ORAL_TABLET | ORAL | 0 refills | Status: DC | PRN
  Filled 2021-10-16: qty 30, 5d supply, fill #0

## 2021-10-16 MED ORDER — ONDANSETRON HCL 4 MG/2ML IJ SOLN
INTRAMUSCULAR | Status: DC | PRN
  Administered 2021-10-16: 4 mg via INTRAVENOUS

## 2021-10-16 MED ORDER — CHLORHEXIDINE GLUCONATE 0.12 % MT SOLN
15.0000 mL | Freq: Once | OROMUCOSAL | Status: AC
  Administered 2021-10-16: 15 mL via OROMUCOSAL
  Filled 2021-10-16: qty 15

## 2021-10-16 MED ORDER — 0.9 % SODIUM CHLORIDE (POUR BTL) OPTIME
TOPICAL | Status: DC | PRN
  Administered 2021-10-16: 4000 mL

## 2021-10-16 MED ORDER — ROCURONIUM BROMIDE 10 MG/ML (PF) SYRINGE
PREFILLED_SYRINGE | INTRAVENOUS | Status: AC
  Filled 2021-10-16: qty 10

## 2021-10-16 MED ORDER — SODIUM CHLORIDE 0.9% FLUSH
INTRAVENOUS | Status: DC | PRN
  Administered 2021-10-16: 20 mL via TOPICAL

## 2021-10-16 MED ORDER — POVIDONE-IODINE 10 % EX SWAB: 2.0000 "application " | Freq: Once | CUTANEOUS | Status: DC

## 2021-10-16 MED ORDER — CEPHALEXIN 500 MG PO CAPS
1000.0000 mg | ORAL_CAPSULE | Freq: Once | ORAL | 0 refills | Status: AC
  Filled 2021-10-16: qty 2, 1d supply, fill #0

## 2021-10-16 MED ORDER — PROPOFOL 10 MG/ML IV BOLUS
INTRAVENOUS | Status: DC | PRN
  Administered 2021-10-16: 150 mg via INTRAVENOUS

## 2021-10-16 MED ORDER — CEFAZOLIN SODIUM-DEXTROSE 2-4 GM/100ML-% IV SOLN
2.0000 g | INTRAVENOUS | Status: AC
  Administered 2021-10-16: 2 g via INTRAVENOUS
  Filled 2021-10-16: qty 100

## 2021-10-16 MED ORDER — ROCURONIUM BROMIDE 10 MG/ML (PF) SYRINGE
PREFILLED_SYRINGE | INTRAVENOUS | Status: DC | PRN
Start: 2021-10-16 — End: 2021-10-16
  Administered 2021-10-16: 20 mg via INTRAVENOUS
  Administered 2021-10-16: 50 mg via INTRAVENOUS

## 2021-10-16 SURGICAL SUPPLY — 80 items
BAG COUNTER SPONGE SURGICOUNT (BAG) ×3 IMPLANT
BAG DECANTER FOR FLEXI CONT (MISCELLANEOUS) IMPLANT
BANDAGE ESMARK 6X9 LF (GAUZE/BANDAGES/DRESSINGS) ×2 IMPLANT
BLADE AVERAGE 25X9 (BLADE) ×3 IMPLANT
BLADE SAG 18X100X1.27 (BLADE) ×3 IMPLANT
BLADE SAGITTAL (BLADE)
BLADE SAW SGTL MED 73X18.5 STR (BLADE) ×3 IMPLANT
BLADE SAW THK.89X75X18XSGTL (BLADE) IMPLANT
BNDG COHESIVE 6X5 TAN STRL LF (GAUZE/BANDAGES/DRESSINGS) ×6 IMPLANT
BNDG ELASTIC 6X15 VLCR STRL LF (GAUZE/BANDAGES/DRESSINGS) ×3 IMPLANT
BNDG ESMARK 6X9 LF (GAUZE/BANDAGES/DRESSINGS) ×3
BOWL SMART MIX CTS (DISPOSABLE) IMPLANT
CNTNR URN SCR LID CUP LEK RST (MISCELLANEOUS) IMPLANT
CONT SPEC 4OZ STRL OR WHT (MISCELLANEOUS)
COVER SURGICAL LIGHT HANDLE (MISCELLANEOUS) ×3 IMPLANT
CUFF TOURN SGL QUICK 34 (TOURNIQUET CUFF) ×1
CUFF TRNQT CYL 34X4.125X (TOURNIQUET CUFF) ×2 IMPLANT
DECANTER SPIKE VIAL GLASS SM (MISCELLANEOUS) IMPLANT
DRAPE INCISE IOBAN 66X45 STRL (DRAPES) ×3 IMPLANT
DRAPE ORTHO SPLIT 77X108 STRL (DRAPES) ×3
DRAPE SURG ORHT 6 SPLT 77X108 (DRAPES) ×6 IMPLANT
DRAPE U-SHAPE 47X51 STRL (DRAPES) ×3 IMPLANT
DRSG AQUACEL AG ADV 3.5X10 (GAUZE/BANDAGES/DRESSINGS) ×3 IMPLANT
DRSG AQUACEL AG ADV 3.5X14 (GAUZE/BANDAGES/DRESSINGS) IMPLANT
DRSG TEGADERM 2-3/8X2-3/4 SM (GAUZE/BANDAGES/DRESSINGS) ×3 IMPLANT
DRSG XEROFORM 1X8 (GAUZE/BANDAGES/DRESSINGS) ×3 IMPLANT
DURAPREP 26ML APPLICATOR (WOUND CARE) ×6 IMPLANT
ELECT CAUTERY BLADE 6.4 (BLADE) ×3 IMPLANT
ELECT REM PT RETURN 9FT ADLT (ELECTROSURGICAL) ×3
ELECTRODE REM PT RTRN 9FT ADLT (ELECTROSURGICAL) ×2 IMPLANT
GAUZE SPONGE 4X4 12PLY STRL (GAUZE/BANDAGES/DRESSINGS) ×3 IMPLANT
GLOVE SRG 8 PF TXTR STRL LF DI (GLOVE) ×2 IMPLANT
GLOVE SURG LTX SZ7 (GLOVE) ×3 IMPLANT
GLOVE SURG LTX SZ8 (GLOVE) ×3 IMPLANT
GLOVE SURG UNDER POLY LF SZ7 (GLOVE) ×3 IMPLANT
GLOVE SURG UNDER POLY LF SZ8 (GLOVE) ×1
GOWN STRL REUS W/ TWL LRG LVL3 (GOWN DISPOSABLE) ×6 IMPLANT
GOWN STRL REUS W/TWL LRG LVL3 (GOWN DISPOSABLE) ×3
GRAFT FEM CONDYLE HEMI RT LAT (Graft) ×3 IMPLANT
GUIDEWIRE .062X7IN LONG (WIRE) ×3 IMPLANT
HANDPIECE INTERPULSE COAX TIP (DISPOSABLE) ×1
HOOD PEEL AWAY FLYTE STAYCOOL (MISCELLANEOUS) IMPLANT
IMMOBILIZER KNEE 20 (SOFTGOODS)
IMMOBILIZER KNEE 20 THIGH 36 (SOFTGOODS) IMPLANT
IMMOBILIZER KNEE 22 UNIV (SOFTGOODS) ×3 IMPLANT
IMMOBILIZER KNEE 24 THIGH 36 (MISCELLANEOUS) IMPLANT
IMMOBILIZER KNEE 24 UNIV (MISCELLANEOUS)
KIT BASIN OR (CUSTOM PROCEDURE TRAY) ×3 IMPLANT
KIT BIOUNI DISP W/DRILL BITS (KITS) ×3 IMPLANT
KIT BONE MRW ASP ANGEL CPRP (KITS) ×3 IMPLANT
KIT CUTTING BIOUNI SZ M17 (KITS) ×3 IMPLANT
KIT TURNOVER KIT B (KITS) ×3 IMPLANT
MANIFOLD NEPTUNE II (INSTRUMENTS) ×3 IMPLANT
NEEDLE 22X1 1/2 (OR ONLY) (NEEDLE) IMPLANT
NEEDLE SPNL 18GX3.5 QUINCKE PK (NEEDLE) ×3 IMPLANT
NS IRRIG 1000ML POUR BTL (IV SOLUTION) ×3 IMPLANT
PACK TOTAL JOINT (CUSTOM PROCEDURE TRAY) ×3 IMPLANT
PAD ARMBOARD 7.5X6 YLW CONV (MISCELLANEOUS) ×6 IMPLANT
PAD CAST 4YDX4 CTTN HI CHSV (CAST SUPPLIES) ×2 IMPLANT
PADDING CAST COTTON 4X4 STRL (CAST SUPPLIES) ×1
PADDING CAST COTTON 6X4 STRL (CAST SUPPLIES) ×3 IMPLANT
SET HNDPC FAN SPRY TIP SCT (DISPOSABLE) ×2 IMPLANT
STRIP CLOSURE SKIN 1/2X4 (GAUZE/BANDAGES/DRESSINGS) ×3 IMPLANT
SUCTION FRAZIER HANDLE 10FR (MISCELLANEOUS) ×1
SUCTION TUBE FRAZIER 10FR DISP (MISCELLANEOUS) ×2 IMPLANT
SUT ETHILON 3 0 PS 1 (SUTURE) ×9 IMPLANT
SUT MNCRL AB 3-0 PS2 18 (SUTURE) IMPLANT
SUT MNCRL AB 3-0 PS2 27 (SUTURE) ×3 IMPLANT
SUT VIC AB 0 CT1 27 (SUTURE) ×5
SUT VIC AB 0 CT1 27XBRD ANBCTR (SUTURE) ×10 IMPLANT
SUT VIC AB 1 CT1 36 (SUTURE) ×15 IMPLANT
SUT VIC AB 2-0 CT1 27 (SUTURE) ×4
SUT VIC AB 2-0 CT1 TAPERPNT 27 (SUTURE) ×8 IMPLANT
SYR 30ML LL (SYRINGE) ×9 IMPLANT
SYR TB 1ML LUER SLIP (SYRINGE) IMPLANT
TOWEL GREEN STERILE (TOWEL DISPOSABLE) ×6 IMPLANT
TOWEL GREEN STERILE FF (TOWEL DISPOSABLE) ×6 IMPLANT
TRAY CATH 16FR W/PLASTIC CATH (SET/KITS/TRAYS/PACK) IMPLANT
WATER STERILE IRR 1000ML POUR (IV SOLUTION) IMPLANT
YANKAUER SUCT BULB TIP NO VENT (SUCTIONS) ×3 IMPLANT

## 2021-10-16 NOTE — Brief Op Note (Signed)
   10/16/2021  11:26 AM  PATIENT:  Angelica Norman  30 y.o. 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  PROCEDURE:  Procedure(s): RIGHT KNEE OPEN OSTEOCHONDRAL DEFECT ALLOGRAFT HARVEST ILIAC BONE Marrow  SURGEON:  Surgeon(s): Cammy Copa, MD  ASSISTANT: magnant pa  ANESTHESIA:   general  EBL: 50 ml    Total I/O In: 1300 [I.V.:1300] Out: 10 [Blood:10]  BLOOD ADMINISTERED: none  DRAINS: none   LOCAL MEDICATIONS USED:  none  SPECIMEN:  No Specimen  COUNTS:  YES  TOURNIQUET:   Total Tourniquet Time Documented: Thigh (Right) - 111 minutes Total: Thigh (Right) - 111 minutes   DICTATION: .Other Dictation: Dictation Number [id number]  PLAN OF CARE: Discharge to home after PACU  PATIENT DISPOSITION:  PACU - hemodynamically stable

## 2021-10-16 NOTE — Op Note (Signed)
NAME: Angelica Norman, Angelica Norman MEDICAL RECORD NO: [id number] ACCOUNT NO: [id number] DATE OF BIRTH: 1991-04-20 FACILITY: [facility] LOCATION: [facility] PHYSICIAN: Graylin Shiver. August Saucer, MD  Operative Report   DATE OF PROCEDURE: 10/16/2021  PREOPERATIVE DIAGNOSIS:  Right knee osteochondritis dissecans lesion, lateral femoral condyle.  POSTOPERATIVE DIAGNOSIS:  Right knee osteochondritis dissecans lesion, lateral femoral condyle.  PROCEDURE:  Right knee osteochondral allograft for osteochondral defect, lateral femoral condyle.  SURGEON:  Graylin Shiver. August Saucer, MD  ASSISTANT:  Karenann Cai, PA.  INDICATIONS:  The patient is a 30 year old patient with right knee pain, who presents for operative management.  After explanation of risks and benefits.  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  DESCRIPTION OF PROCEDURE:  The patient was brought to the operating room where general anesthetic was induced.  Preoperative antibiotics were administered.  Timeout was called.  Right iliac crest and right leg region prescrubbed with alcohol and  Betadine, allowed to air dry, prepped with DuraPrep solution and draped in a sterile manner.  Ioban used to cover the operative field.  Timeout was called.  The small incision made about 3 fingerbreadths proximal to the anterior superior iliac crest.   Trocar was then placed into the iliac crest and 60 mL of bone marrow aspirate was obtained and spun down for platelet rich plasma.  This incision was irrigated and closed using 3-0 Vicryl.  Impervious dressing placed.  Attention then directed towards the  right knee.  Leg was elevated, examined with the Esmarch wrap.  Tourniquet was inflated.  The patient had full range of motion of the knee with stable collateral and cruciate ligaments.  Anterior approach to knee was made.  Skin and subcutaneous tissue  were sharply divided.  Median parapatellar  approach was made and marked with #1 Vicryl suture.  Fat pad was partially excised.  Patella was everted, knee was flexed.  The medial femoral condyle, intact, lateral femoral condyle had a large osteochondral  defect measuring about 2 cm x 3.5 cm.  Lateral meniscus visualized was intact.  ACL and PCL intact.  The osteochondral defect was unroofed.  Significantly sclerotic bone was present beneath.  Next, the lesion was sized.  Initially, we were planning to do  a cylindrical plug, but this required [employer] because of the length of the defect in the oblong nature of the defect.  Correct sizer was chosen and sized on the femoral condyle.  Next, the same sizer in the same orientation was then placed over the donor  condyle.  Punching was performed.  Sewing was performed.  Appropriate depth of the implant was achieved.  This was then prepared on the back table to sit flush within the trial device.  Once the feathering with the ACL saw was complete and the graft sat  flush into the trials. Attention was directed towards the recipient site.  The donor plug was irrigated with irrigating solution to remove marrow elements and allowed to sit within the PRP.  Next, attention was directed towards the recipient site.   Significantly sclerotic bone was present.  Using 2 guide pins through the sizer reaming was performed to the appropriate depth.  This was redone to achieve a slightly deeper graft site after the first attempt.  Going a 1 mm deeper allowed the trial sizer  to sit flushed.  Next, because of the sclerotic bone on the recipient site, a 0.62 K-wire was utilized to drill into the  sclerotic bone to facilitate bleeding.  A thorough irrigation was performed on this recipient site.  The donor plug was then taper  on its edges with a rongeur.  PRP was then placed into the recipient site.  Next, the bone plug was gently tapped into position with excellent press fit obtained with a flush edges circumferentially.  A  very nice fit was obtained.  Tourniquet was  released.  The capsule was anesthetized using a combination of Marcaine, saline and Exparel.  Skin edges were anesthetized using Marcaine, morphine, clonidine.  Three liters of irrigating solution were poured into the knee joint.  Bleeding points  encountered were controlled using electrocautery.  After pouring irrigation was performed the arthrotomy was closed over a bolster using #1 Vicryl suture followed by interrupted inverted 0 Vicryl suture, 2-0 Vicryl suture, and 3-0 Monocryl with  Steri-Strips and Aquacel dressing applied.  The patient tolerated the procedure well without immediate complications.  Ace wrap, Iceman and knee immobilizer were applied.  POSTOPERATIVE PLAN:  The patient will be nonweightbearing for the first week with no range of motion of the knee.  Luke's assistance was required for opening, closing, graft preparation and mobilization of tissue.  His assistance was a medical necessity.   PUS D: 10/16/2021 11:34:27 am T: 10/16/2021 12:01:00 pm  JOB: [id number]/ [id number]

## 2021-10-16 NOTE — Anesthesia Procedure Notes (Signed)
Procedure Name: Intubation Date/Time: 10/16/2021 8:02 AM Performed by: Minerva Ends, CRNA Pre-anesthesia Checklist: Patient identified, Emergency Drugs available, Suction available and Patient being monitored Patient Re-evaluated:Patient Re-evaluated prior to induction Oxygen Delivery Method: Circle system utilized Preoxygenation: Pre-oxygenation with 100% oxygen Induction Type: IV induction Ventilation: Mask ventilation without difficulty Laryngoscope Size: Mac and 3 Grade View: Grade II Tube type: Oral Tube size: 7.0 mm Number of attempts: 1 Airway Equipment and Method: Stylet Placement Confirmation: ETT inserted through vocal cords under direct vision, positive ETCO2 and breath sounds checked- equal and bilateral Secured at: 21 cm Tube secured with: Tape Dental Injury: Teeth and Oropharynx as per pre-operative assessment

## 2021-10-16 NOTE — H&P (Signed)
Cianni Manny is an 30 y.o. female.   Chief Complaint: right knee pain HPI: Kalinda Romaniello is a 30 y.o. female with right knee pain.  Patient has a history of right knee pain over the last 2 years that has intermittent based on her activity.  She denies any specific injury to the knee.  Localizes pain to the lateral aspect of the knee.  Denies any swelling or effusion at all.  He does take Advil as needed and did physical therapy which provided temporary relief.  Denies any history of injury and has never had injection or surgery to the knee.  No significant medical history.  She is gone to the point where even walking causes pain if she is very active.  This feels like it is worsening progressively.  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  In her free time she enjoys power lifting and yoga but she has had to back off of that more in the last several months especially since she had her MRI scan.  She has had MRI scan of the right knee that was ordered by Dr. 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  She denies any significant family history of DVT/PE though she does note her maternal grandfather had PE near end-of-life while bedbound.  Left knee work-up negative for osteochondritis dissecans.  Past Medical History:  Diagnosis Date   Abnormal Pap smear of cervix 07/2015   Neg/Pos HR HPV--in [place]--no colpo/no treatment   Allergy    Eczema    Family history of adverse reaction to anesthesia    "it's hard for my dad to stay asleep during surgery"   Thyroid nodule 07/2018    Past Surgical History:  Procedure Laterality Date   TYMPANOSTOMY TUBE PLACEMENT Bilateral     Family History  Problem Relation Age of Onset   Arthritis Mother     Hyperlipidemia Mother    Diabetes Father    Cancer Father        prostate   Thyroid disease Father        hypothyroid   Heart disease Maternal Grandmother    Hyperlipidemia Maternal Grandmother    Stroke Maternal Grandmother    Stroke Paternal Grandmother    Hypertension Paternal Grandmother    Cancer Paternal Grandfather 23       Dec colon cancer   Social History:  reports that she has never smoked. She has never used smokeless tobacco. She reports current alcohol use of about 3.0 standard drinks per week. She reports that she does not use drugs.  Allergies: No Known Allergies  Medications Prior to Admission  Medication Sig Dispense Refill   cetirizine (ZYRTEC) 10 MG tablet Take 10 mg by mouth daily.     fluticasone (FLONASE) 50 MCG/ACT nasal spray USE 1 SPRAY INTO EACH NOSTRIL DAILY. (Patient taking differently: Place 1 spray into both nostrils daily as needed for allergies.) 16 g 2   Ketotifen Fumarate (ALLERGY EYE DROPS OP) Place 1 drop into both eyes daily as needed (allergies).     levonorgestrel (MIRENA) 20 MCG/24HR IUD 1 each by Intrauterine route once.     Melatonin 5 MG CAPS Take 5 mg by mouth at bedtime.     Multiple Vitamin (MULTIVITAMIN WITH MINERALS) TABS tablet Take 1 tablet by  mouth daily.     senna (SENOKOT) 8.6 MG tablet Take 1 tablet by mouth daily as needed for constipation.     triamcinolone cream (KENALOG) 0.1 % Apply 1 application topically daily as needed. 45 g 2    Results for orders placed or performed during the hospital encounter of 10/16/21 (from the past 48 hour(s))  Pregnancy, urine POC     Status: None   Collection Time: 10/16/21  6:09 AM  Result Value Ref Range   Preg Test, Ur NEGATIVE NEGATIVE    Comment:        THE SENSITIVITY OF THIS METHODOLOGY IS >24 mIU/mL    No results found.  Review of Systems  Musculoskeletal:  Positive for arthralgias.  All other systems reviewed and are negative.  Blood pressure 127/64, pulse 76, temperature  98.1 F (36.7 C), temperature source Oral, resp. rate 18, height 5\' 4"  (1.626 m), weight 64.4 kg, SpO2 99 %. Physical Exam Vitals reviewed.  HENT:     Head: Normocephalic.     Nose: Nose normal.  Eyes:     Pupils: Pupils are equal, round, and reactive to light.  Cardiovascular:     Rate and Rhythm: Normal rate.     Pulses: Normal pulses.  Pulmonary:     Effort: Pulmonary effort is normal.  Abdominal:     General: Abdomen is flat.  Musculoskeletal:     Cervical back: Normal range of motion.  Skin:    General: Skin is warm.     Capillary Refill: Capillary refill takes less than 2 seconds.  Neurological:     General: No focal deficit present.     Mental Status: She is alert.  Psychiatric:        Mood and Affect: Mood normal.   Ortho exam demonstrates minimal tenderness over the lateral joint line.  No effusion is present.  Excellent range of motion from 0 to greater than 120 degrees.  No calf tenderness.  Negative Homans' sign.  Excellent quadricep strength and able to perform multiple straight leg raises without difficulty.  No tenderness over the medial joint line.  No laxity to MCL or LCL at 0 or 30 degrees.  No laxity to anterior/posterior drawer.  Negative Lachman exam  Assessment/Plan Patient is a 30 year old female who presents for evaluation of right knee pain.  She has had on and off knee pain over the last 2 years with recent MRI ordered by Dr. 34 that revealed 2.0 x 1.7 cm OCD of the weightbearing lateral femoral condyle.  She has some degenerative tearing of the lateral meniscus as well.  She has progressively worsening right knee pain but not much in the way of positive findings on exam with no significant joint line tenderness or any effusion that is present.  She is a very active individual and enjoys her job as an Denyse Amass as well as her leisure pursuits including yoga and power lifting.  Discussed the MRI findings as well as options available to patient.   After lengthy discussion, patient would like to proceed with osteochondral allograft.  She understands that there will be a period of nonweightbearing and she will have restricted activities for 4 to 6 months after the procedure.  She also understands the risks of the surgery including risk of nerve/vessel damage, knee stiffness, continued pain, medical complication from surgery such as DVT/PE, need for revision surgery.  She will be posted for surgery depending on the timeline for the allograft.  Acupuncturist, MD  10/16/2021, 6:50 AM

## 2021-10-16 NOTE — Transfer of Care (Signed)
Immediate Anesthesia Transfer of Care Note  Patient: Angelica Norman  Procedure(s) Performed: RIGHT KNEE OPEN OSTEOCHONDRAL DEFECT ALLOGRAFT (Right: Knee) HARVEST ILIAC BONE GRAFT (Right: Hip)  Patient Location: PACU  Anesthesia Type:General  Level of Consciousness: awake, alert , oriented and patient cooperative  Airway & Oxygen Therapy: Patient Spontanous Breathing  Post-op Assessment: Report given to RN and Post -op Vital signs reviewed and stable  Post vital signs: Reviewed and stable  Last Vitals:  Vitals Value Taken Time  BP 112/44 10/16/21 1119  Temp    Pulse 110 10/16/21 1122  Resp 11 10/16/21 1122  SpO2 98 % 10/16/21 1122  Vitals shown include unvalidated device data.  Last Pain:  Vitals:   10/16/21 0814  TempSrc: Oral  PainSc:       Patients Stated Pain Goal: 2 (10/16/21 0555)  Complications: No notable events documented.

## 2021-10-16 NOTE — Anesthesia Preprocedure Evaluation (Addendum)
Anesthesia Evaluation  Patient identified by MRN, date of birth, ID band Patient awake    Reviewed: Allergy & Precautions, NPO status , Patient's Chart, lab work & pertinent test results  Airway Mallampati: II  TM Distance: >3 FB Neck ROM: Full    Dental  (+) Dental Advisory Given   Pulmonary neg pulmonary ROS,    breath sounds clear to auscultation       Cardiovascular negative cardio ROS   Rhythm:Regular Rate:Normal     Neuro/Psych negative neurological ROS     GI/Hepatic negative GI ROS, Neg liver ROS,   Endo/Other  negative endocrine ROS  Renal/GU negative Renal ROS     Musculoskeletal   Abdominal   Peds  Hematology negative hematology ROS (+)   Anesthesia Other Findings   Reproductive/Obstetrics                             Lab Results  Component Value Date   WBC 6.5 10/09/2021   HGB 13.1 10/09/2021   HCT 38.6 10/09/2021   MCV 95.1 10/09/2021   PLT 240 10/09/2021   Lab Results  Component Value Date   CREATININE 0.61 10/09/2021   BUN 12 10/09/2021   NA 138 10/09/2021   K 3.8 10/09/2021   CL 103 10/09/2021   CO2 28 10/09/2021    Anesthesia Physical Anesthesia Plan  ASA: 1  Anesthesia Plan: General   Post-op Pain Management:  Regional for Post-op pain   Induction: Intravenous  PONV Risk Score and Plan: 3 and Dexamethasone, Ondansetron, Midazolam and Treatment may vary due to age or medical condition  Airway Management Planned: Oral ETT  Additional Equipment: None  Intra-op Plan:   Post-operative Plan: Extubation in OR  Informed Consent: I have reviewed the patients History and Physical, chart, labs and discussed the procedure including the risks, benefits and alternatives for the proposed anesthesia with the patient or authorized representative who has indicated his/her understanding and acceptance.     Dental advisory given  Plan Discussed with:  CRNA  Anesthesia Plan Comments:         Anesthesia Quick Evaluation

## 2021-10-16 NOTE — Anesthesia Procedure Notes (Signed)
Anesthesia Regional Block: Adductor canal block   Pre-Anesthetic Checklist: , timeout performed,  Correct Patient, Correct Site, Correct Laterality,  Correct Procedure, Correct Position, site marked,  Risks and benefits discussed,  Surgical consent,  Pre-op evaluation,  At surgeon's request and post-op pain management  Laterality: Right  Prep: chloraprep       Needles:  Injection technique: Single-shot  Needle Type: Echogenic Needle     Needle Length: 9cm  Needle Gauge: 21     Additional Needles:   Procedures:,,,, ultrasound used (permanent image in chart),,    Narrative:  Start time: 10/16/2021 6:57 AM End time: 10/16/2021 7:02 AM Injection made incrementally with aspirations every 5 mL.  Performed by: Personally  Anesthesiologist: Marcene Duos, MD

## 2021-10-17 ENCOUNTER — Encounter (HOSPITAL_COMMUNITY): Payer: Self-pay | Admitting: Orthopedic Surgery

## 2021-10-17 NOTE — Anesthesia Postprocedure Evaluation (Signed)
Anesthesia Post Note  Patient: Julane Crock  Procedure(s) Performed: RIGHT KNEE OPEN OSTEOCHONDRAL DEFECT ALLOGRAFT (Right: Knee) HARVEST ILIAC BONE GRAFT (Right: Hip)     Patient location during evaluation: PACU Anesthesia Type: General Level of consciousness: awake and alert Pain management: pain level controlled Vital Signs Assessment: post-procedure vital signs reviewed and stable Respiratory status: spontaneous breathing, nonlabored ventilation, respiratory function stable and patient connected to nasal cannula oxygen Cardiovascular status: blood pressure returned to baseline and stable Postop Assessment: no apparent nausea or vomiting Anesthetic complications: no   No notable events documented.  Last Vitals:  Vitals:   10/16/21 1134 10/16/21 1148  BP: 107/60 115/64  Pulse: 91 95  Resp: 14 13  Temp:  36.5 C  SpO2: 98% 100%    Last Pain:  Vitals:   10/16/21 1148  TempSrc:   PainSc: 2                  Kennieth Rad

## 2021-10-24 ENCOUNTER — Encounter: Payer: Self-pay | Admitting: Orthopedic Surgery

## 2021-10-24 ENCOUNTER — Other Ambulatory Visit: Payer: Self-pay

## 2021-10-24 ENCOUNTER — Ambulatory Visit (INDEPENDENT_AMBULATORY_CARE_PROVIDER_SITE_OTHER): Payer: No Typology Code available for payment source | Admitting: Orthopedic Surgery

## 2021-10-24 DIAGNOSIS — M25562 Pain in left knee: Secondary | ICD-10-CM

## 2021-10-24 DIAGNOSIS — G8929 Other chronic pain: Secondary | ICD-10-CM

## 2021-10-24 DIAGNOSIS — M25561 Pain in right knee: Secondary | ICD-10-CM

## 2021-10-24 NOTE — Progress Notes (Signed)
   Post-Op Visit Note   Patient: Angelica Norman           Date of Birth: 11/21/1991           MRN: [id number] Visit Date: 10/24/2021 PCP: [place], Betty G, MD   Assessment & Plan:  Chief Complaint:  Chief Complaint  Patient presents with   Right Knee - Routine Post Op    DOS:10/16/21   Visit Diagnoses:  1. Left knee pain, unspecified chronicity   2. Chronic pain of right knee     Plan: Angelica Norman is a 30 year old patient who underwent right knee osteochondral allograft for osteochondral defect.  She has been doing well.  On exam no calf tenderness negative Homans.  CPM is 0-30.  Trace effusion present.  She is able to do a straight leg raise.  Taking Percocet at night.  Also taking Celebrex and aspirin.  Hip incision intact and the sutures are removed.  Plan is to discontinue knee immobilizer.  Nonweightbearing for 3 more weeks.  Okay for CPM to 90.  Follow-up in 3 weeks.  Follow-Up Instructions: Return in about 3 weeks (around 11/14/2021).   Orders:  No orders of the defined types were placed in this encounter.  No orders of the defined types were placed in this encounter.   Imaging: No results found.  PMFS History: Patient Active Problem List   Diagnosis Date Noted   Thyroid nodule 07/17/2019   Eczema 07/19/2017   Allergic rhinitis 07/19/2017   Past Medical History:  Diagnosis Date   Abnormal Pap smear of cervix 07/2015   Neg/Pos HR HPV--in [place]--no colpo/no treatment   Allergy    Eczema    Family history of adverse reaction to anesthesia    "it's hard for my dad to stay asleep during surgery"   Thyroid nodule 07/2018    Family History  Problem Relation Age of Onset   Arthritis Mother    Hyperlipidemia Mother    Diabetes Father    Cancer Father        prostate   Thyroid disease Father        hypothyroid   Heart disease Maternal Grandmother    Hyperlipidemia Maternal Grandmother    Stroke Maternal Grandmother    Stroke Paternal  Grandmother    Hypertension Paternal Grandmother    Cancer Paternal Grandfather 65       Dec colon cancer    Past Surgical History:  Procedure Laterality Date   HARVEST BONE GRAFT Right 10/16/2021   Procedure: HARVEST ILIAC BONE GRAFT;  Surgeon: Cammy Copa, MD;  Location: [facility] OR;  Service: Orthopedics;  Laterality: Right;   OSTEOCHONDRAL DEFECT REPAIR/RECONSTRUCTION Right 10/16/2021   Procedure: RIGHT KNEE OPEN OSTEOCHONDRAL DEFECT ALLOGRAFT;  Surgeon: Cammy Copa, MD;  Location: [facility] OR;  Service: Orthopedics;  Laterality: Right;   TYMPANOSTOMY TUBE PLACEMENT Bilateral    Social History   Occupational History   Not on file  Tobacco Use   Smoking status: Never   Smokeless tobacco: Never  Vaping Use   Vaping Use: Never used  Substance and Sexual Activity   Alcohol use: Yes    Alcohol/week: 3.0 standard drinks    Types: 3 Cans of beer per week   Drug use: No   Sexual activity: Yes    Partners: Male    Birth control/protection: Implant    Comment: Nexplanon inserted 10-26-14/Lt.arm

## 2021-10-26 DIAGNOSIS — M9320 Osteochondritis dissecans of unspecified site: Secondary | ICD-10-CM

## 2021-11-06 NOTE — Telephone Encounter (Signed)
Ciox advised. 

## 2021-11-13 ENCOUNTER — Encounter: Payer: Self-pay | Admitting: Orthopedic Surgery

## 2021-11-13 ENCOUNTER — Other Ambulatory Visit: Payer: Self-pay

## 2021-11-13 ENCOUNTER — Telehealth: Payer: Self-pay | Admitting: Orthopedic Surgery

## 2021-11-13 ENCOUNTER — Ambulatory Visit (INDEPENDENT_AMBULATORY_CARE_PROVIDER_SITE_OTHER): Payer: No Typology Code available for payment source | Admitting: Surgical

## 2021-11-13 DIAGNOSIS — G8929 Other chronic pain: Secondary | ICD-10-CM

## 2021-11-13 DIAGNOSIS — M25561 Pain in right knee: Secondary | ICD-10-CM

## 2021-11-13 NOTE — Progress Notes (Signed)
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   Visit Diagnoses:  1. Chronic pain of right knee     Plan: Patient is a 30 year old female who presents s/p right knee osteochondral allograft procedure on 10/16/2021.  She states pain is significantly improved since last office visit.  She was using the CPM machine but has now sent it back.  Denies any fevers, chills, night sweats, drainage, chest pain, shortness of breath, calf pain.  Not taking any medications for pain control.  She has not started weightbearing on the leg yet.  She does note some increased numbness lateral to the incision as well as some increased sensitivity through the medial aspect of the calf and the saphenous nerve distribution.  She feels that this increase sensitivity is improving over the last week as she has been trying some therapy methods to decrease the sensitivity.  She has been working on passive range of motion by herself since the CPM machine was taken.  On exam patient has 0 degrees extension 105 degrees of knee flexion.  Incision is well-healed with no evidence of infection or dehiscence.  No effusion is present.  She is able to perform straight leg raise without extensor lag multiple times.  No calf tenderness.  Negative Homans' sign.  Palpable pedal pulses.  Plan is to initiate weightbearing with 50% weightbearing with crutches for the first week and then progress to full weightbearing over the next week with crutches.  She may wean out of the crutches at 2 weeks out from this appointment.  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  She will start physical therapy upstairs  to focus on nonweightbearing quadricep strengthening exercises as well as gait training and knee range of motion exercises.  Hold off on any stairs at all for now and no loaded knee flexion exercises.   Plan to follow-up in 4 weeks for clinical recheck and recommended patient call the office or send a MyChart message with any concerns.  Follow-Up Instructions: No follow-ups on file.   Orders:  Orders Placed This Encounter  Procedures   Ambulatory referral to Physical Therapy   No orders of the defined types were placed in this encounter.   Imaging: No results found.  PMFS History: Patient Active Problem List   Diagnosis Date Noted   Osteochondritis dissecans    Thyroid nodule 07/17/2019   Eczema 07/19/2017   Allergic rhinitis 07/19/2017   Past Medical History:  Diagnosis Date   Abnormal Pap smear of cervix 07/2015   Neg/Pos HR HPV--in [place]--no colpo/no treatment   Allergy    Eczema    Family history of adverse reaction to anesthesia    "it's hard for my dad to stay asleep during surgery"   Thyroid nodule 07/2018    Family History  Problem Relation Age of Onset   Arthritis Mother    Hyperlipidemia Mother    Diabetes Father    Cancer Father        prostate   Thyroid disease Father        hypothyroid  Heart disease Maternal Grandmother    Hyperlipidemia Maternal Grandmother    Stroke Maternal Grandmother    Stroke Paternal Grandmother    Hypertension Paternal Grandmother    Cancer Paternal Grandfather 65       Dec colon cancer    Past Surgical History:  Procedure Laterality Date   HARVEST BONE GRAFT Right 10/16/2021   Procedure: HARVEST ILIAC BONE GRAFT;  Surgeon: Cammy Copa, MD;  Location: [facility] OR;  Service: Orthopedics;  Laterality: Right;   OSTEOCHONDRAL DEFECT REPAIR/RECONSTRUCTION Right 10/16/2021   Procedure: RIGHT KNEE OPEN OSTEOCHONDRAL DEFECT ALLOGRAFT;  Surgeon: Cammy Copa, MD;  Location: [facility] OR;  Service: Orthopedics;  Laterality:  Right;   TYMPANOSTOMY TUBE PLACEMENT Bilateral    Social History   Occupational History   Not on file  Tobacco Use   Smoking status: Never   Smokeless tobacco: Never  Vaping Use   Vaping Use: Never used  Substance and Sexual Activity   Alcohol use: Yes    Alcohol/week: 3.0 standard drinks    Types: 3 Cans of beer per week   Drug use: No   Sexual activity: Yes    Partners: Male    Birth control/protection: Implant    Comment: Nexplanon inserted 10-26-14/Lt.arm

## 2021-11-13 NOTE — Telephone Encounter (Signed)
Received medical records release form and $25.00 cash   Forwarding to [employer] today 

## 2021-11-14 ENCOUNTER — Ambulatory Visit
Payer: No Typology Code available for payment source | Attending: Orthopedic Surgery | Admitting: Rehabilitative and Restorative Service Providers"

## 2021-11-14 ENCOUNTER — Other Ambulatory Visit: Payer: Self-pay

## 2021-11-14 ENCOUNTER — Encounter: Payer: Self-pay | Admitting: Rehabilitative and Restorative Service Providers"

## 2021-11-14 DIAGNOSIS — G8929 Other chronic pain: Secondary | ICD-10-CM | POA: Insufficient documentation

## 2021-11-14 DIAGNOSIS — M25561 Pain in right knee: Secondary | ICD-10-CM | POA: Diagnosis present

## 2021-11-14 DIAGNOSIS — R262 Difficulty in walking, not elsewhere classified: Secondary | ICD-10-CM | POA: Diagnosis present

## 2021-11-14 DIAGNOSIS — R252 Cramp and spasm: Secondary | ICD-10-CM | POA: Diagnosis present

## 2021-11-14 DIAGNOSIS — M6281 Muscle weakness (generalized): Secondary | ICD-10-CM | POA: Insufficient documentation

## 2021-11-14 NOTE — Therapy (Signed)
[facility] [address] [place], [place], [address] Phone: [phone]   Fax:  [phone]  Physical Therapy Treatment  Patient Details  Name: Angelica Norman MRN: [id number] Date of Birth: 02-Oct-1991 Referring Provider (PT): Dr Cammy Copa   Encounter Date: 11/14/2021   PT End of Session - 11/14/21 0917     Visit Number 1    Date for PT Re-Evaluation 01/09/22    Authorization Type [facility]    PT Start Time 0845    PT Stop Time 0925    PT Time Calculation (min) 40 min    Activity Tolerance Patient tolerated treatment well    Behavior During Therapy [facility] for tasks assessed/performed             Past Medical History:  Diagnosis Date   Abnormal Pap smear of cervix 07/2015   Neg/Pos HR HPV--in [place]--no colpo/no treatment   Allergy    Eczema    Family history of adverse reaction to anesthesia    "it's hard for my dad to stay asleep during surgery"   Thyroid nodule 07/2018    Past Surgical History:  Procedure Laterality Date   HARVEST BONE GRAFT Right 10/16/2021   Procedure: HARVEST ILIAC BONE GRAFT;  Surgeon: Cammy Copa, MD;  Location: [facility] OR;  Service: Orthopedics;  Laterality: Right;   OSTEOCHONDRAL DEFECT REPAIR/RECONSTRUCTION Right 10/16/2021   Procedure: RIGHT KNEE OPEN OSTEOCHONDRAL DEFECT ALLOGRAFT;  Surgeon: Cammy Copa, MD;  Location: [facility] OR;  Service: Orthopedics;  Laterality: Right;   TYMPANOSTOMY TUBE PLACEMENT Bilateral     There were no vitals filed for this visit.   Subjective Assessment - 11/14/21 0906     Subjective Pt reports that she started having R knee pain approx 2 years ago and it has progressively gotten worse.  Pt underwent R knee osteochondral allograft procedure on 10/16/21.    Pertinent History unremarkable    Limitations Walking    How long can you stand comfortably? 20-30 min    How long can you walk comfortably? 10 min    Patient  Stated Goals To get back to exercise and get back to work.    Currently in Pain? Yes    Pain Score 4     Pain Location Knee    Pain Orientation Right    Pain Descriptors / Indicators Aching;Cramping    Pain Type Acute pain    Pain Onset 1 to 4 weeks ago    Pain Frequency Intermittent    Aggravating Factors  with movement/use                [facility] PT Assessment - 11/14/21 0001       Assessment   Medical Diagnosis M25.561,G89.29 (ICD-10-CM) - Chronic pain of right knee.   s/p right knee osteochondral allograft procedure   Referring Provider (PT) Dr Cammy Copa    Onset Date/Surgical Date 10/16/21    Hand Dominance Right    Next MD Visit Dec 11, 2021    Prior Therapy yes, in past      Precautions   Precautions None      Restrictions   Weight Bearing Restrictions Yes    RLE Weight Bearing Partial weight bearing    RLE Partial Weight Bearing Percentage or Pounds 50% weight bearing for 1 week, then progress to full weight bearing over the next week.      Balance Screen   Has the patient fallen in  the past 6 months No    Has the patient had a decrease in activity level because of a fear of falling?  No    Is the patient reluctant to leave their home because of a fear of falling?  No      Home Environment   Living Environment Private residence    Living Arrangements Spouse/significant other    Type of Home Apartment   2nd floor apartment   Home Access Stairs to enter    Home Equipment Crutches;Walker - 2 wheels      Prior Function   Level of Independence Independent    Vocation Full time employment    Vocation Requirements inpatient TBI occupational therapist    Leisure hiking, weight lifting, yoga, cooking      Cognition   Overall Cognitive Status Within Functional Limits for tasks assessed      Observation/Other Assessments   Focus on Therapeutic Outcomes (FOTO)  49%   Projected 74% by visit 17     ROM / Strength   AROM / PROM / Strength AROM;PROM;Strength       AROM   AROM Assessment Site Knee    Right/Left Knee Right    Right Knee Extension -18    Right Knee Flexion 99      PROM   PROM Assessment Site Knee    Right/Left Knee Right    Right Knee Extension -11    Right Knee Flexion 104      Strength   Overall Strength Comments R knee strength of 3/5 throughout                           [facility] Adult PT Treatment/Exercise - 11/14/21 0001       Exercises   Exercises Knee/Hip      Knee/Hip Exercises: Aerobic   Recumbent Bike L 1.0 x5 min with partial revolutions with PT present to discuss progress.      Knee/Hip Exercises: Seated   Long Arc Quad AROM;Right;1 set;10 reps      Knee/Hip Exercises: Supine   Quad Sets Strengthening;Right;1 set;10 reps    Short Arc Quad Sets AROM;Right;1 set;10 reps    Heel Slides AROM;Right;1 set;10 reps    Straight Leg Raises Strengthening;Right;1 set;10 reps                       PT Short Term Goals - 11/14/21 1000       PT SHORT TERM GOAL #1   Title be independent in initial HEP    Time 2    Period Weeks    Status New      PT SHORT TERM GOAL #2   Title report a 30% reduction in frequency and intensity R knee pain with daily activity.    Time 3    Period Weeks    Status New               PT Long Term Goals - 11/14/21 1000       PT LONG TERM GOAL #1   Title be independent in advanced HEP    Time 8    Period Weeks    Status New      PT LONG TERM GOAL #2   Title report a 70% reduction in frequency and intensity of R knee pain with daily tasks and after weight training    Time 8    Period Weeks    Status  New      PT LONG TERM GOAL #3   Title Increase R knee ROM to at least 0 to 125 to allow her to perform work activities.    Time 8    Period Weeks    Status New      PT LONG TERM GOAL #4   Title improve FOTO to > or = to 74 to improve funtion    Baseline 49    Time 8    Period Weeks      PT LONG TERM GOAL #5   Title Increase R knee  strength to 5/5 to allow pt to return to ability to perform leisure and work activities.    Time 8    Period Weeks    Status New                   Plan - 11/14/21 0947     Clinical Impression Statement Patient is a 30 year old female who presents s/p right knee osteochondral allograft procedure by Dr August Saucer on 10/16/2021. 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 Pt presents with orders to to initiate weightbearing with 50% weightbearing with crutches for the first week and then progress to full weightbearing over the next week with crutches.  She may wean out of the crutches at 2 weeks. Pt presents with decreased ROM, decreased strength, ambulating with crutches with decreased stance time on RLE. Pt plans to start going back to work at [employer] duty in a couple of weeks and work on paperwork tasks. Pt would benefit from skilled PT to address her functional impariments and progress her with goal related activities to allow her to return to work and her active lifestyle without pain.    Examination-Activity Limitations Locomotion Level;Squat;Stairs;Stand    Examination-Participation Restrictions Occupation;Community Activity    Stability/Clinical Decision Making Stable/Uncomplicated    Clinical Decision Making Low    Rehab Potential Excellent    PT Frequency 2x / week    PT Duration 8 weeks    PT Treatment/Interventions ADLs/Self Care Home Management;Aquatic Therapy;Cryotherapy;Electrical Stimulation;Iontophoresis 4mg /ml Dexamethasone;Moist Heat;Gait training;Stair training;Functional mobility training;Therapeutic activities;Therapeutic exercise;Balance training;Neuromuscular re-education;Patient/family education;Manual techniques;Scar mobilization;Passive range of motion;Dry needling;Taping;Vasopneumatic Device;Joint Manipulations    PT Next Visit Plan vaso for R knee, progress per protocol    PT Home Exercise Plan heel  slides, SAQ, LAQ, quad sets, SLR    Consulted and Agree with Plan of Care Patient             Patient will benefit from skilled therapeutic intervention in order to improve the following deficits and impairments:  Difficulty walking, Decreased range of motion, Increased muscle spasms, Pain, Decreased balance, Decreased strength  Visit Diagnosis: Muscle weakness (generalized) - Plan: PT plan of care cert/re-cert  Chronic pain of right knee - Plan: PT plan of care cert/re-cert  Difficulty in walking, not elsewhere classified - Plan: PT plan of care cert/re-cert  Cramp and spasm - Plan: PT plan of care cert/re-cert     Problem List Patient Active Problem List   Diagnosis Date Noted   Osteochondritis dissecans    Thyroid nodule 07/17/2019   Eczema 07/19/2017   Allergic rhinitis 07/19/2017    07/21/2017, PT, DPT 11/14/2021, 10:09 AM  [facility] [address] [place], [place], [place] Phone: [phone]   Fax:  [phone]  Name: Angelica Norman MRN: Angelica Norman Date of Birth: Jan 18, 1991

## 2021-11-18 ENCOUNTER — Ambulatory Visit: Payer: No Typology Code available for payment source

## 2021-11-18 ENCOUNTER — Other Ambulatory Visit: Payer: Self-pay

## 2021-11-18 DIAGNOSIS — G8929 Other chronic pain: Secondary | ICD-10-CM

## 2021-11-18 DIAGNOSIS — M6281 Muscle weakness (generalized): Secondary | ICD-10-CM

## 2021-11-18 DIAGNOSIS — M25561 Pain in right knee: Secondary | ICD-10-CM

## 2021-11-18 DIAGNOSIS — R252 Cramp and spasm: Secondary | ICD-10-CM

## 2021-11-18 DIAGNOSIS — R262 Difficulty in walking, not elsewhere classified: Secondary | ICD-10-CM

## 2021-11-18 NOTE — Therapy (Signed)
[facility] [address] [place], [place], [address] Phone: [phone]   Fax:  [phone]  Physical Therapy Treatment  Patient Details  Name: Angelica Norman MRN: [id number] Date of Birth: 06-06-1991 Referring Provider (PT): Dr Cammy Copa   Encounter Date: 11/18/2021   PT End of Session - 11/18/21 1056     Visit Number 2    Date for PT Re-Evaluation 01/09/22    Authorization Type [facility]    PT Start Time 1016    PT Stop Time 1115    PT Time Calculation (min) 59 min    Activity Tolerance Patient tolerated treatment well    Behavior During Therapy [facility] for tasks assessed/performed             Past Medical History:  Diagnosis Date   Abnormal Pap smear of cervix 07/2015   Neg/Pos HR HPV--in [place]--no colpo/no treatment   Allergy    Eczema    Family history of adverse reaction to anesthesia    "it's hard for my dad to stay asleep during surgery"   Thyroid nodule 07/2018    Past Surgical History:  Procedure Laterality Date   HARVEST BONE GRAFT Right 10/16/2021   Procedure: HARVEST ILIAC BONE GRAFT;  Surgeon: Cammy Copa, MD;  Location: [facility] OR;  Service: Orthopedics;  Laterality: Right;   OSTEOCHONDRAL DEFECT REPAIR/RECONSTRUCTION Right 10/16/2021   Procedure: RIGHT KNEE OPEN OSTEOCHONDRAL DEFECT ALLOGRAFT;  Surgeon: Cammy Copa, MD;  Location: [facility] OR;  Service: Orthopedics;  Laterality: Right;   TYMPANOSTOMY TUBE PLACEMENT Bilateral     There were no vitals filed for this visit.   Subjective Assessment - 11/18/21 1017     Subjective I am working on my ROM, my leg feels really tight.    Currently in Pain? Yes    Pain Score 2     Pain Location Knee    Pain Orientation Right    Pain Descriptors / Indicators Aching;Cramping    Pain Type Acute pain    Pain Onset 1 to 4 weeks ago    Pain Frequency Intermittent                [facility] PT Assessment -  11/18/21 0001       AROM   Right Knee Flexion 105      PROM   Right Knee Extension 8    Right Knee Flexion 110                           [facility] Adult PT Treatment/Exercise - 11/18/21 0001       Knee/Hip Exercises: Stretches   Lobbyist Right;5 reps;10 seconds    Quad Stretch Limitations prone with strap    Hip Flexor Stretch 3 reps;Right;20 seconds      Knee/Hip Exercises: Aerobic   Recumbent Bike L 1.0 x 7 min with partial revolutions with PT present to discuss progress.      Knee/Hip Exercises: Supine   Short Arc Quad Sets AROM;Right;1 set;10 reps    Short Arc [employer] Limitations with Russian stim    Straight Leg Raises Strengthening;Right;1 set;10 reps      Knee/Hip Exercises: Sidelying   Clams red band 2x10      Modalities   Modalities Programmer, applications Location Rt knee-VMO- SAQ complete with stimulation    Engineer, manufacturing [place]  Electrical Stimulation Parameters 20/10    Electrical Stimulation Goals Strength      Vasopneumatic   Number Minutes Vasopneumatic  15 minutes    Vasopnuematic Location  Knee    Vasopneumatic Pressure Medium    Vasopneumatic Temperature  3 snowflakes                       PT Short Term Goals - 11/14/21 1000       PT SHORT TERM GOAL #1   Title be independent in initial HEP    Time 2    Period Weeks    Status New      PT SHORT TERM GOAL #2   Title report a 30% reduction in frequency and intensity R knee pain with daily activity.    Time 3    Period Weeks    Status New               PT Long Term Goals - 11/14/21 1000       PT LONG TERM GOAL #1   Title be independent in advanced HEP    Time 8    Period Weeks    Status New      PT LONG TERM GOAL #2   Title report a 70% reduction in frequency and intensity of R knee pain with daily tasks and after weight training    Time 8    Period Weeks     Status New      PT LONG TERM GOAL #3   Title Increase R knee ROM to at least 0 to 125 to allow her to perform work activities.    Time 8    Period Weeks    Status New      PT LONG TERM GOAL #4   Title improve FOTO to > or = to 74 to improve funtion    Baseline 49    Time 8    Period Weeks      PT LONG TERM GOAL #5   Title Increase R knee strength to 5/5 to allow pt to return to ability to perform leisure and work activities.    Time 8    Period Weeks    Status New                   Plan - 11/18/21 1049     Clinical Impression Statement First time follow-up after evaluation.  Pt is doing well with WBAT with crutches and is working on quad strength and ROM of the Rt knee.  Pt with reduced VMO contraction and used [place] stimulation with SAQ to activate.  Pt with improved Lt knee A/ROM today into flexion and extension. Pt will continue with 50% WB for 2 more days and then work to progress to FWB with crutches for 1 week after.  Pt will continue to benefit from skilled PT to address Rt knee strength, flexibility, gait and edema management s/p surgery following MD orders.    PT Frequency 2x / week    PT Duration 8 weeks    PT Treatment/Interventions ADLs/Self Care Home Management;Aquatic Therapy;Cryotherapy;Electrical Stimulation;Iontophoresis 4mg /ml Dexamethasone;Moist Heat;Gait training;Stair training;Functional mobility training;Therapeutic activities;Therapeutic exercise;Balance training;Neuromuscular re-education;Patient/family education;Manual techniques;Scar mobilization;Passive range of motion;Dry needling;Taping;Vasopneumatic Device;Joint Manipulations    PT Next Visit Plan vaso for Rt knee, progress per protocol    PT Home Exercise Plan heel slides, SAQ, LAQ, quad sets, SLR, sidelying clams with band    Consulted and Agree with Plan of  Care Patient             Patient will benefit from skilled therapeutic intervention in order to improve the following deficits  and impairments:  Difficulty walking, Decreased range of motion, Increased muscle spasms, Pain, Decreased balance, Decreased strength  Visit Diagnosis: Chronic pain of right knee  Difficulty in walking, not elsewhere classified  Muscle weakness (generalized)  Cramp and spasm     Problem List Patient Active Problem List   Diagnosis Date Noted   Osteochondritis dissecans    Thyroid nodule 07/17/2019   Eczema 07/19/2017   Allergic rhinitis 07/19/2017   Lorrene Reid, PT 11/18/21 10:58 AM   [facility] [address] [place], [place], [address] Phone: [phone]   Fax:  [phone]  Name: Angelica Norman MRN: [id number] Date of Birth: 1991/05/16

## 2021-11-20 ENCOUNTER — Encounter: Payer: Self-pay | Admitting: Rehabilitative and Restorative Service Providers"

## 2021-11-20 ENCOUNTER — Ambulatory Visit: Payer: No Typology Code available for payment source | Admitting: Rehabilitative and Restorative Service Providers"

## 2021-11-20 ENCOUNTER — Other Ambulatory Visit: Payer: Self-pay

## 2021-11-20 DIAGNOSIS — M6281 Muscle weakness (generalized): Secondary | ICD-10-CM

## 2021-11-20 DIAGNOSIS — R262 Difficulty in walking, not elsewhere classified: Secondary | ICD-10-CM

## 2021-11-20 DIAGNOSIS — G8929 Other chronic pain: Secondary | ICD-10-CM

## 2021-11-20 NOTE — Therapy (Signed)
[facility] [address] [place], [place], [address] Phone: [phone]   Fax:  [phone]  Physical Therapy Treatment  Patient Details  Name: Angelica Norman MRN: [id number] Date of Birth: 08/12/1991 Referring Provider (PT): Dr Cammy Copa   Encounter Date: 11/20/2021   PT End of Session - 11/20/21 1145     Visit Number 3    Date for PT Re-Evaluation 01/09/22    Authorization Type [facility]    PT Start Time 1015    PT Stop Time 1110    PT Time Calculation (min) 55 min    Activity Tolerance Patient tolerated treatment well    Behavior During Therapy [facility] for tasks assessed/performed             Past Medical History:  Diagnosis Date   Abnormal Pap smear of cervix 07/2015   Neg/Pos HR HPV--in [place]--no colpo/no treatment   Allergy    Eczema    Family history of adverse reaction to anesthesia    "it's hard for my dad to stay asleep during surgery"   Thyroid nodule 07/2018    Past Surgical History:  Procedure Laterality Date   HARVEST BONE GRAFT Right 10/16/2021   Procedure: HARVEST ILIAC BONE GRAFT;  Surgeon: Cammy Copa, MD;  Location: [facility] OR;  Service: Orthopedics;  Laterality: Right;   OSTEOCHONDRAL DEFECT REPAIR/RECONSTRUCTION Right 10/16/2021   Procedure: RIGHT KNEE OPEN OSTEOCHONDRAL DEFECT ALLOGRAFT;  Surgeon: Cammy Copa, MD;  Location: [facility] OR;  Service: Orthopedics;  Laterality: Right;   TYMPANOSTOMY TUBE PLACEMENT Bilateral     There were no vitals filed for this visit.   Subjective Assessment - 11/20/21 1019     Subjective I was able to do the recumbent bike in my apartment complex yesterday.    Patient Stated Goals To get back to exercise and get back to work.    Currently in Pain? Yes    Pain Score 4     Pain Location Knee    Pain Orientation Right    Pain Descriptors / Indicators Aching;Cramping    Pain Type Acute pain                                [facility] Adult PT Treatment/Exercise - 11/20/21 0001       Knee/Hip Exercises: Stretches   Quad Stretch Right;5 reps;10 seconds    Quad Stretch Limitations prone with strap      Knee/Hip Exercises: Aerobic   Recumbent Bike L 1.0 x 7 min with full revolutions with PT present to discuss progress.      Knee/Hip Exercises: Supine   Short Arc Quad Sets Strengthening;Right    Short Arc Quad Sets Limitations With Russian stim x10 min    Straight Leg Raises Strengthening;Right;1 set;10 reps      Knee/Hip Exercises: Sidelying   Clams red loop 2x10      Modalities   Modalities Electrical Stimulation;Vasopneumatic      Programme researcher, broadcasting/film/video Location Rt knee-VMO- SAQ complete with stimulation    Firefighter Parameters 20/10, x10 min    Electrical Stimulation Goals Strength      Vasopneumatic   Number Minutes Vasopneumatic  10 minutes    Vasopnuematic Location  Knee    Vasopneumatic Pressure Medium    Vasopneumatic Temperature  3 snowflakes  PT Short Term Goals - 11/20/21 1155       PT SHORT TERM GOAL #1   Title be independent in initial HEP    Status Achieved               PT Long Term Goals - 11/14/21 1000       PT LONG TERM GOAL #1   Title be independent in advanced HEP    Time 8    Period Weeks    Status New      PT LONG TERM GOAL #2   Title report a 70% reduction in frequency and intensity of R knee pain with daily tasks and after weight training    Time 8    Period Weeks    Status New      PT LONG TERM GOAL #3   Title Increase R knee ROM to at least 0 to 125 to allow her to perform work activities.    Time 8    Period Weeks    Status New      PT LONG TERM GOAL #4   Title improve FOTO to > or = to 74 to improve funtion    Baseline 49    Time 8    Period Weeks      PT LONG TERM GOAL #5   Title Increase  R knee strength to 5/5 to allow pt to return to ability to perform leisure and work activities.    Time 8    Period Weeks    Status New                   Plan - 11/20/21 1146     Clinical Impression Statement Angelica Norman continues to progress with goal related activities. She continues to tolerate [place] e-stim well to assist with use of SAQ in supine.  Pt continues to progress with ambulation with crutches and will wean herself towards full weight bearing per MD restrictions. Pt is progressing with improved ROM and improved R knee strength.    PT Treatment/Interventions ADLs/Self Care Home Management;Aquatic Therapy;Cryotherapy;Electrical Stimulation;Iontophoresis 4mg /ml Dexamethasone;Moist Heat;Gait training;Stair training;Functional mobility training;Therapeutic activities;Therapeutic exercise;Balance training;Neuromuscular re-education;Patient/family education;Manual techniques;Scar mobilization;Passive range of motion;Dry needling;Taping;Vasopneumatic Device;Joint Manipulations    PT Next Visit Plan vaso for Rt knee as needed, progress per protocol    Consulted and Agree with Plan of Care Patient             Patient will benefit from skilled therapeutic intervention in order to improve the following deficits and impairments:  Difficulty walking, Decreased range of motion, Increased muscle spasms, Pain, Decreased balance, Decreased strength  Visit Diagnosis: Chronic pain of right knee  Difficulty in walking, not elsewhere classified  Muscle weakness (generalized)     Problem List Patient Active Problem List   Diagnosis Date Noted   Osteochondritis dissecans    Thyroid nodule 07/17/2019   Eczema 07/19/2017   Allergic rhinitis 07/19/2017    07/21/2017, PT, DPT 11/20/2021, 11:58 AM  [facility] Out[facility] [address] [place], [place], [place] Phone: [phone]   Fax:  [phone]  Name: Angelica Norman MRN: [name] Date of Birth: [birth year]

## 2021-11-21 NOTE — Progress Notes (Signed)
HPI: [name] is a 30 y.o. female, who is here today for her routine physical.  Last CPE: 07/22/20  Regular exercise 3 or more time per week: Upper body exercises and yoga.   Following a healthy diet: Yes, she cooks her meals.  Chronic medical problems: allergic rhinitis,eczema,and thyroid nodule among some. Since her last visit,she had right knee surgery. Doing right knee PT.  There is no immunization history on file for this patient. Health Maintenance  Topic Date Due   PAP-Cervical Cytology Screening  08/10/2021   PAP SMEAR-Modifier  08/10/2021   INFLUENZA VACCINE  03/06/2022 (Originally 07/07/2021)   TETANUS/TDAP  07/07/2026   Hepatitis C Screening  Completed   HIV Screening  Completed   Pneumococcal Vaccine 60-57 Years old  Aged Out   HPV VACCINES  Aged Out   Established with gyn, Dr Edward Jolly. Last gyn visit 09/02/21.  She has no concerns today.  Lab Results  Component Value Date   CHOL 170 07/23/2020   HDL 57 07/23/2020   LDLCALC 99 07/23/2020   TRIG 56 07/23/2020   CHOLHDL 3.0 07/23/2020   Lab Results  Component Value Date   CREATININE 0.61 10/09/2021   BUN 12 10/09/2021   NA 138 10/09/2021   K 3.8 10/09/2021   CL 103 10/09/2021   CO2 28 10/09/2021   Review of Systems  Constitutional:  Negative for appetite change, fatigue and fever.  HENT:  Negative for hearing loss, mouth sores, trouble swallowing and voice change.   Eyes:  Negative for pain and visual disturbance.  Respiratory:  Negative for cough, shortness of breath and wheezing.   Cardiovascular:  Negative for chest pain and leg swelling.  Gastrointestinal:  Negative for abdominal pain, nausea and vomiting.       No changes in bowel habits.  Endocrine: Negative for cold intolerance, heat intolerance, polydipsia, polyphagia and polyuria.  Genitourinary:  Negative for decreased urine volume, dysuria, hematuria, vaginal bleeding and vaginal discharge.  Musculoskeletal:  Positive  for arthralgias. Negative for myalgias.  Skin:  Negative for color change and rash.  Allergic/Immunologic: Positive for environmental allergies.  Neurological:  Negative for syncope, weakness and headaches.  Psychiatric/Behavioral:  Negative for confusion and sleep disturbance. The patient is not nervous/anxious.   All other systems reviewed and are negative.  Current Outpatient Medications on File Prior to Visit  Medication Sig Dispense Refill   celecoxib (CELEBREX) 100 MG capsule Take 1 capsule (100 mg total) by mouth 2 (two) times daily. 60 capsule 0   cetirizine (ZYRTEC) 10 MG tablet Take 10 mg by mouth daily.     fluticasone (FLONASE) 50 MCG/ACT nasal spray USE 1 SPRAY INTO EACH NOSTRIL DAILY. (Patient taking differently: Place 1 spray into both nostrils daily as needed for allergies.) 16 g 2   Ketotifen Fumarate (ALLERGY EYE DROPS OP) Place 1 drop into both eyes daily as needed (allergies).     levonorgestrel (MIRENA) 20 MCG/24HR IUD 1 each by Intrauterine route once.     Melatonin 5 MG CAPS Take 5 mg by mouth at bedtime.     Multiple Vitamin (MULTIVITAMIN WITH MINERALS) TABS tablet Take 1 tablet by mouth daily.     senna (SENOKOT) 8.6 MG tablet Take 1 tablet by mouth daily as needed for constipation.     triamcinolone cream (KENALOG) 0.1 % Apply 1 application topically daily as needed. 45 g 2   No current facility-administered medications on file prior to visit.     Past Medical History:  Diagnosis Date   Abnormal Pap smear of cervix 07/2015   Neg/Pos HR HPV--in [place]--no colpo/no treatment   Allergy    Eczema    Family history of adverse reaction to anesthesia    "it's hard for my dad to stay asleep during surgery"   Thyroid nodule 07/2018    Past Surgical History:  Procedure Laterality Date   HARVEST BONE GRAFT Right 10/16/2021   Procedure: HARVEST ILIAC BONE GRAFT;  Surgeon: Cammy Copa, MD;  Location: [facility] OR;  Service: Orthopedics;  Laterality: Right;    OSTEOCHONDRAL DEFECT REPAIR/RECONSTRUCTION Right 10/16/2021   Procedure: RIGHT KNEE OPEN OSTEOCHONDRAL DEFECT ALLOGRAFT;  Surgeon: Cammy Copa, MD;  Location: [facility] OR;  Service: Orthopedics;  Laterality: Right;   TYMPANOSTOMY TUBE PLACEMENT Bilateral     No Known Allergies  Family History  Problem Relation Age of Onset   Arthritis Mother    Hyperlipidemia Mother    Diabetes Father    Cancer Father        prostate   Thyroid disease Father        hypothyroid   Heart disease Maternal Grandmother    Hyperlipidemia Maternal Grandmother    Stroke Maternal Grandmother    Stroke Paternal Grandmother    Hypertension Paternal Grandmother    Cancer Paternal Grandfather 83       Dec colon cancer    Social History   Socioeconomic History   Marital status: Married    Spouse name: Not on file   Number of children: Not on file   Years of education: Not on file   Highest education level: Not on file  Occupational History   Not on file  Tobacco Use   Smoking status: Never   Smokeless tobacco: Never  Vaping Use   Vaping Use: Never used  Substance and Sexual Activity   Alcohol use: Yes    Alcohol/week: 3.0 standard drinks    Types: 3 Cans of beer per week   Drug use: No   Sexual activity: Yes    Partners: Male    Birth control/protection: Implant    Comment: Nexplanon inserted 10-26-14/Lt.arm  Other Topics Concern   Not on file  Social History Narrative   Not on file   Social Determinants of Health   Financial Resource Strain: Not on file  Food Insecurity: Not on file  Transportation Needs: Not on file  Physical Activity: Not on file  Stress: Not on file  Social Connections: Not on file    Vitals:   11/24/21 1358  BP: 118/60  Pulse: 81  Resp: 16  SpO2: 98%   Body mass index is 24.89 kg/m.  Wt Readings from Last 3 Encounters:  11/24/21 145 lb (65.8 kg)  10/16/21 142 lb (64.4 kg)  10/09/21 146 lb 4.8 oz (66.4 kg)   Physical Exam Vitals and nursing note  reviewed.  Constitutional:      General: She is not in acute distress.    Appearance: She is well-developed.  HENT:     Head: Normocephalic and atraumatic.     Right Ear: Hearing, tympanic membrane, ear canal and external ear normal.     Left Ear: Hearing, tympanic membrane, ear canal and external ear normal.     Mouth/Throat:     Mouth: Mucous membranes are moist.     Pharynx: Oropharynx is clear. Uvula midline.  Eyes:     Extraocular Movements: Extraocular movements intact.     Conjunctiva/sclera: Conjunctivae normal.     Pupils: Pupils  are equal, round, and reactive to light.  Neck:     Thyroid: Thyromegaly (mild.) present. No thyroid tenderness.     Trachea: No tracheal deviation.  Cardiovascular:     Rate and Rhythm: Normal rate and regular rhythm.     Pulses:          Dorsalis pedis pulses are 2+ on the right side and 2+ on the left side.     Heart sounds: No murmur heard. Pulmonary:     Effort: Pulmonary effort is normal. No respiratory distress.     Breath sounds: Normal breath sounds.  Abdominal:     Palpations: Abdomen is soft. There is no hepatomegaly or mass.     Tenderness: There is no abdominal tenderness.  Genitourinary:    Comments: Deferred to gyn. Musculoskeletal:     Comments: No major deformity or signs of synovitis appreciated. Antalgic gait assisted with crutches.  Lymphadenopathy:     Cervical: No cervical adenopathy.     Upper Body:     Right upper body: No supraclavicular adenopathy.     Left upper body: No supraclavicular adenopathy.  Skin:    General: Skin is warm.     Findings: No erythema or rash.  Neurological:     General: No focal deficit present.     Mental Status: She is alert and oriented to person, place, and time.     Cranial Nerves: No cranial nerve deficit.     Coordination: Coordination normal.     Deep Tendon Reflexes:     Reflex Scores:      Bicep reflexes are 2+ on the right side and 2+ on the left side. Psychiatric:         Speech: Speech normal.     Comments: Well groomed, good eye contact.   ASSESSMENT AND PLAN:  [name] was here today annual physical examination.  Orders Placed This Encounter  Procedures   Hemoglobin A1c   Lipid panel   Lab Results  Component Value Date   HGBA1C 5.1 11/24/2021   Lab Results  Component Value Date   CHOL 185 11/24/2021   HDL 63.70 11/24/2021   LDLCALC 105 (H) 11/24/2021   TRIG 78.0 11/24/2021   CHOLHDL 3 11/24/2021   Routine general medical examination at a health care facility We discussed the importance of regular physical activity and healthy diet for prevention of chronic illness and/or complications. Preventive guidelines reviewed. Vaccination up to date. Continue her female care with gyn. Next CPE in a year.  Screening for lipoid disorders -     Lipid panel  Screening for endocrine, metabolic and immunity disorder -     Hemoglobin A1c  Return in 1 year (on 11/24/2022) for CPE.  Duchess Armendarez G. Martinique, MD  [facility]. [place] office.

## 2021-11-24 ENCOUNTER — Ambulatory Visit (INDEPENDENT_AMBULATORY_CARE_PROVIDER_SITE_OTHER): Payer: No Typology Code available for payment source | Admitting: Family Medicine

## 2021-11-24 ENCOUNTER — Encounter: Payer: Self-pay | Admitting: Family Medicine

## 2021-11-24 VITALS — BP 118/60 | HR 81 | Resp 16 | Ht 64.0 in | Wt 145.0 lb

## 2021-11-24 DIAGNOSIS — Z13220 Encounter for screening for lipoid disorders: Secondary | ICD-10-CM | POA: Diagnosis not present

## 2021-11-24 DIAGNOSIS — Z13228 Encounter for screening for other metabolic disorders: Secondary | ICD-10-CM | POA: Diagnosis not present

## 2021-11-24 DIAGNOSIS — Z130 Encounter for screening for diseases of the blood and blood-forming organs and certain disorders involving the immune mechanism: Secondary | ICD-10-CM

## 2021-11-24 DIAGNOSIS — Z0000 Encounter for general adult medical examination without abnormal findings: Secondary | ICD-10-CM

## 2021-11-24 DIAGNOSIS — Z1329 Encounter for screening for other suspected endocrine disorder: Secondary | ICD-10-CM

## 2021-11-24 NOTE — Patient Instructions (Addendum)
A few things to remember from today's visit:  Routine general medical examination at a health care facility  Screening for lipoid disorders - Plan: Lipid panel  Screening for endocrine, metabolic and immunity disorder - Plan: Hemoglobin A1c  If you need refills please call your pharmacy. Do not use My Chart to request refills or for acute issues that need immediate attention.   Please be sure medication list is accurate. If a new problem present, please set up appointment sooner than planned today.   Health Maintenance, Female Adopting a healthy lifestyle and getting preventive care are important in promoting health and wellness. Ask your health care provider about: The right schedule for you to have regular tests and exams. Things you can do on your own to prevent diseases and keep yourself healthy. What should I know about diet, weight, and exercise? Eat a healthy diet  Eat a diet that includes plenty of vegetables, fruits, low-fat dairy products, and lean protein. Do not eat a lot of foods that are high in solid fats, added sugars, or sodium. Maintain a healthy weight Body mass index (BMI) is used to identify weight problems. It estimates body fat based on height and weight. Your health care provider can help determine your BMI and help you achieve or maintain a healthy weight. Get regular exercise Get regular exercise. This is one of the most important things you can do for your health. Most adults should: Exercise for at least 150 minutes each week. The exercise should increase your heart rate and make you sweat (moderate-intensity exercise). Do strengthening exercises at least twice a week. This is in addition to the moderate-intensity exercise. Spend less time sitting. Even light physical activity can be beneficial. Watch cholesterol and blood lipids Have your blood tested for lipids and cholesterol at 28 years of age, then have this test every 5 years. Have your cholesterol  levels checked more often if: Your lipid or cholesterol levels are high. You are older than 28 years of age. You are at high risk for heart disease. What should I know about cancer screening? Depending on your health history and family history, you may need to have cancer screening at various ages. This may include screening for: Breast cancer. Cervical cancer. Colorectal cancer. Skin cancer. Lung cancer. What should I know about heart disease, diabetes, and high blood pressure? Blood pressure and heart disease High blood pressure causes heart disease and increases the risk of stroke. This is more likely to develop in people who have high blood pressure readings or are overweight. Have your blood pressure checked: Every 3-5 years if you are 62-3 years of age. Every year if you are 39 years old or older. Diabetes Have regular diabetes screenings. This checks your fasting blood sugar level. Have the screening done: Once every three years after age 83 if you are at a normal weight and have a low risk for diabetes. More often and at a younger age if you are overweight or have a high risk for diabetes. What should I know about preventing infection? Hepatitis B If you have a higher risk for hepatitis B, you should be screened for this virus. Talk with your health care provider to find out if you are at risk for hepatitis B infection. Hepatitis C Testing is recommended for: Everyone born from [birth year] through 1963. Anyone with known risk factors for hepatitis C. Sexually transmitted infections (STIs) Get screened for STIs, including gonorrhea and chlamydia, if: You are sexually active and are  younger than 28 years of age. You are older than 28 years of age and your health care provider tells you that you are at risk for this type of infection. Your sexual activity has changed since you were last screened, and you are at increased risk for chlamydia or gonorrhea. Ask your health care provider if  you are at risk. Ask your health care provider about whether you are at high risk for HIV. Your health care provider may recommend a prescription medicine to help prevent HIV infection. If you choose to take medicine to prevent HIV, you should first get tested for HIV. You should then be tested every 3 months for as long as you are taking the medicine. Pregnancy If you are about to stop having your period (premenopausal) and you may become pregnant, seek counseling before you get pregnant. Take 400 to 800 micrograms (mcg) of folic acid every day if you become pregnant. Ask for birth control (contraception) if you want to prevent pregnancy. Osteoporosis and menopause Osteoporosis is a disease in which the bones lose minerals and strength with aging. This can result in bone fractures. If you are 45 years old or older, or if you are at risk for osteoporosis and fractures, ask your health care provider if you should: Be screened for bone loss. Take a calcium or vitamin D supplement to lower your risk of fractures. Be given hormone replacement therapy (HRT) to treat symptoms of menopause. Follow these instructions at home: Alcohol use Do not drink alcohol if: Your health care provider tells you not to drink. You are pregnant, may be pregnant, or are planning to become pregnant. If you drink alcohol: Limit how much you have to: 0-1 drink a day. Know how much alcohol is in your drink. In the [place] one drink equals one 12 oz bottle of beer (355 mL), one 5 oz glass of wine (148 mL), or one 1 oz glass of hard liquor (44 mL). Lifestyle Do not use any products that contain nicotine or tobacco. These products include cigarettes, chewing tobacco, and vaping devices, such as e-cigarettes. If you need help quitting, ask your health care provider. Do not use street drugs. Do not share needles. Ask your health care provider for help if you need support or information about quitting drugs. General  instructions Schedule regular health, dental, and eye exams. Stay current with your vaccines. Tell your health care provider if: You often feel depressed. You have ever been abused or do not feel safe at home. Summary Adopting a healthy lifestyle and getting preventive care are important in promoting health and wellness. Follow your health care provider's instructions about healthy diet, exercising, and getting tested or screened for diseases. Follow your health care provider's instructions on monitoring your cholesterol and blood pressure. This information is not intended to replace advice given to you by your health care provider. Make sure you discuss any questions you have with your health care provider. Document Revised: 04/14/2021 Document Reviewed: 04/14/2021 Elsevier Patient Education  2022 [employer].

## 2021-11-25 LAB — LIPID PANEL
Cholesterol: 185 mg/dL (ref 0–200)
HDL: 63.7 mg/dL (ref >39.00)
LDL Cholesterol: 105 mg/dL — ABNORMAL HIGH (ref 0–99)
NonHDL: 120.95
Total CHOL/HDL Ratio: 3
Triglycerides: 78 mg/dL (ref 0.0–149.0)
VLDL: 15.6 mg/dL (ref 0.0–40.0)

## 2021-11-25 LAB — HEMOGLOBIN A1C: Hgb A1c MFr Bld: 5.1 % (ref 4.6–6.5)

## 2021-11-26 ENCOUNTER — Other Ambulatory Visit: Payer: Self-pay

## 2021-11-26 ENCOUNTER — Encounter: Payer: Self-pay | Admitting: Physical Therapy

## 2021-11-26 ENCOUNTER — Ambulatory Visit (INDEPENDENT_AMBULATORY_CARE_PROVIDER_SITE_OTHER): Payer: No Typology Code available for payment source | Admitting: Physical Therapy

## 2021-11-26 DIAGNOSIS — M6281 Muscle weakness (generalized): Secondary | ICD-10-CM

## 2021-11-26 DIAGNOSIS — R252 Cramp and spasm: Secondary | ICD-10-CM

## 2021-11-26 DIAGNOSIS — M25561 Pain in right knee: Secondary | ICD-10-CM | POA: Diagnosis not present

## 2021-11-26 DIAGNOSIS — R262 Difficulty in walking, not elsewhere classified: Secondary | ICD-10-CM

## 2021-11-26 DIAGNOSIS — G8929 Other chronic pain: Secondary | ICD-10-CM

## 2021-11-26 NOTE — Therapy (Signed)
[facility] Physical Therapy [address] [place], [place], [address] Phone: [phone]   Fax:  [phone]  Physical Therapy Treatment  Patient Details  Name: Angelica Norman MRN: [id number] Date of Birth: 1990/12/29 Referring Provider (PT): Danton Sewer MD   Encounter Date: 11/26/2021   PT End of Session - 11/26/21 0942     Visit Number 4    Date for PT Re-Evaluation 01/09/22    Authorization Type [facility]    PT Start Time 0848    PT Stop Time 0940    PT Time Calculation (min) 52 min    Activity Tolerance Patient tolerated treatment well    Behavior During Therapy [facility] for tasks assessed/performed             Past Medical History:  Diagnosis Date   Abnormal Pap smear of cervix 07/2015   Neg/Pos HR HPV--in [place]--no colpo/no treatment   Allergy    Eczema    Family history of adverse reaction to anesthesia    "it's hard for my dad to stay asleep during surgery"   Thyroid nodule 07/2018    Past Surgical History:  Procedure Laterality Date   HARVEST BONE GRAFT Right 10/16/2021   Procedure: HARVEST ILIAC BONE GRAFT;  Surgeon: Cammy Copa, MD;  Location: [facility] OR;  Service: Orthopedics;  Laterality: Right;   OSTEOCHONDRAL DEFECT REPAIR/RECONSTRUCTION Right 10/16/2021   Procedure: RIGHT KNEE OPEN OSTEOCHONDRAL DEFECT ALLOGRAFT;  Surgeon: Cammy Copa, MD;  Location: [facility] OR;  Service: Orthopedics;  Laterality: Right;   TYMPANOSTOMY TUBE PLACEMENT Bilateral     There were no vitals filed for this visit.   Subjective Assessment - 11/26/21 0924     Subjective Pt arriving to therapy reporting 3/10 pain in right knee.    Pertinent History unremarkable    Patient Stated Goals To get back to exercise and get back to work.    Currently in Pain? Yes    Pain Score 3     Pain Location Knee    Pain Orientation Right    Pain Descriptors / Indicators Aching    Pain Type Acute pain    Pain Onset 1 to 4 weeks ago                 [facility] PT Assessment - 11/26/21 0001       Assessment   Medical Diagnosis M25.561, G89.29    Referring Provider (PT) Danton Sewer MD    Onset Date/Surgical Date 10/16/21    Hand Dominance Right    Next MD Visit 12/11/2021      Restrictions   RLE Weight Bearing Weight bearing as tolerated                           [facility] Adult PT Treatment/Exercise - 11/26/21 0001       Blood Flow Restriction   Blood Flow Restriction Yes      Blood Flow Restriction-Positions    Blood Flow Restriction Position Supine      BFR-Supine   Supine Limb Occulsion Pressure (mmHg) 175    Supine Exercise Pressure (mmHg) 140    Supine Exercise Prescription 30,15,15,15, reps w/ 30-60 sec rest      Knee/Hip Exercises: Aerobic   Recumbent Bike L2 x 6 minutes      Knee/Hip Exercises: Standing   Other Standing Knee Exercises mini squats with BFR 30, 15, 15, 15      Knee/Hip Exercises: Seated  Long Arc Quad Limitations LAQ with BFR 30, 15, 15, 15, 15      Knee/Hip Exercises: Supine   Short Arc Quad Sets Right;Strengthening    Short Arc Quad Sets Limitations BFR 30, 15, 15, 15 holding 5 seconds      Vasopneumatic   Number Minutes Vasopneumatic  10 minutes    Vasopnuematic Location  Knee    Vasopneumatic Pressure Medium    Vasopneumatic Temperature  34 degrees                     PT Education - 11/26/21 0930     Education Details explaination of BFR and treatment    Person(s) Educated Patient    Methods Explanation    Comprehension Verbalized understanding              PT Short Term Goals - 11/26/21 0942       PT SHORT TERM GOAL #1   Title be independent in initial HEP    Status Achieved               PT Long Term Goals - 11/26/21 0942       PT LONG TERM GOAL #1   Title be independent in advanced HEP    Status On-going      PT LONG TERM GOAL #2   Title report a 70% reduction in frequency and intensity of R knee pain  with daily tasks and after weight training    Status On-going      PT LONG TERM GOAL #3   Title Increase R knee ROM to at least 0 to 125 to allow her to perform work activities.    Status On-going      PT LONG TERM GOAL #4   Title improve FOTO to > or = to 74 to improve funtion    Status On-going      PT LONG TERM GOAL #5   Title Increase R knee strength to 5/5 to allow pt to return to ability to perform leisure and work activities.    Status On-going                   Plan - 11/26/21 0931     Clinical Impression Statement Pt arriving to therapy reporting 3/10 pain in her right knee. We started BFR for quad strengtheing and pt tolerated well. Pt stating she will be able to get rid of her crutches tomorrow per MD. Continue skilled PT to maximize funciton.    Examination-Activity Limitations Locomotion Level;Squat;Stairs;Stand    Examination-Participation Restrictions Occupation;Community Activity    Stability/Clinical Decision Making Stable/Uncomplicated    Rehab Potential Excellent    PT Frequency 2x / week    PT Duration 8 weeks    PT Treatment/Interventions ADLs/Self Care Home Management;Aquatic Therapy;Cryotherapy;Electrical Stimulation;Iontophoresis 4mg /ml Dexamethasone;Moist Heat;Gait training;Stair training;Functional mobility training;Therapeutic activities;Therapeutic exercise;Balance training;Neuromuscular re-education;Patient/family education;Manual techniques;Scar mobilization;Passive range of motion;Dry needling;Taping;Vasopneumatic Device;Joint Manipulations    PT Next Visit Plan BFR, vaso, progress per protocol    PT Home Exercise Plan heel slides, SAQ, LAQ, quad sets, SLR, sidelying clams with band    Consulted and Agree with Plan of Care Patient             Patient will benefit from skilled therapeutic intervention in order to improve the following deficits and impairments:  Difficulty walking, Decreased range of motion, Increased muscle spasms, Pain,  Decreased balance, Decreased strength  Visit Diagnosis: Chronic pain of right knee  Difficulty in walking,  not elsewhere classified  Muscle weakness (generalized)  Cramp and spasm     Problem List Patient Active Problem List   Diagnosis Date Noted   Osteochondritis dissecans    Thyroid nodule 07/17/2019   Eczema 07/19/2017   Allergic rhinitis 07/19/2017    Sharmon Leyden, PT, MPT 11/26/2021, 9:44 AM  [facility] Physical Therapy [address] [place], [place], [address] Phone: [phone]   Fax:  [phone]  Name: Angelica Norman MRN: [id number] Date of Birth: 1991-02-23

## 2021-11-27 ENCOUNTER — Encounter: Payer: Self-pay | Admitting: Physical Therapy

## 2021-12-03 ENCOUNTER — Encounter: Payer: No Typology Code available for payment source | Admitting: Physical Therapy

## 2021-12-04 ENCOUNTER — Other Ambulatory Visit: Payer: Self-pay

## 2021-12-04 ENCOUNTER — Ambulatory Visit (INDEPENDENT_AMBULATORY_CARE_PROVIDER_SITE_OTHER): Payer: No Typology Code available for payment source | Admitting: Rehabilitative and Restorative Service Providers"

## 2021-12-04 ENCOUNTER — Encounter: Payer: Self-pay | Admitting: Rehabilitative and Restorative Service Providers"

## 2021-12-04 DIAGNOSIS — G8929 Other chronic pain: Secondary | ICD-10-CM

## 2021-12-04 DIAGNOSIS — M25561 Pain in right knee: Secondary | ICD-10-CM | POA: Diagnosis not present

## 2021-12-04 DIAGNOSIS — M6281 Muscle weakness (generalized): Secondary | ICD-10-CM | POA: Diagnosis not present

## 2021-12-04 DIAGNOSIS — R262 Difficulty in walking, not elsewhere classified: Secondary | ICD-10-CM

## 2021-12-04 DIAGNOSIS — R252 Cramp and spasm: Secondary | ICD-10-CM

## 2021-12-04 NOTE — Therapy (Signed)
[facility] [address] [place], [place], [address] Phone: [phone]   Fax:  [phone]  Physical Therapy Treatment  Patient Details  Name: Angelica Norman MRN: [id number] Date of Birth: 1991/10/26 Referring Provider (PT): Danton Sewer MD   Encounter Date: 12/04/2021   PT End of Session - 12/04/21 1222     Visit Number 5    Date for PT Re-Evaluation 01/09/22    Authorization Type [employer]    PT Start Time 1140    PT Stop Time 1225    PT Time Calculation (min) 45 min    Activity Tolerance Patient tolerated treatment well    Behavior During Therapy [facility] for tasks assessed/performed             Past Medical History:  Diagnosis Date   Abnormal Pap smear of cervix 07/2015   Neg/Pos HR HPV--in [place]--no colpo/no treatment   Allergy    Eczema    Family history of adverse reaction to anesthesia    "it's hard for my dad to stay asleep during surgery"   Thyroid nodule 07/2018    Past Surgical History:  Procedure Laterality Date   HARVEST BONE GRAFT Right 10/16/2021   Procedure: HARVEST ILIAC BONE GRAFT;  Surgeon: Cammy Copa, MD;  Location: [facility] OR;  Service: Orthopedics;  Laterality: Right;   OSTEOCHONDRAL DEFECT REPAIR/RECONSTRUCTION Right 10/16/2021   Procedure: RIGHT KNEE OPEN OSTEOCHONDRAL DEFECT ALLOGRAFT;  Surgeon: Cammy Copa, MD;  Location: [facility] OR;  Service: Orthopedics;  Laterality: Right;   TYMPANOSTOMY TUBE PLACEMENT Bilateral     There were no vitals filed for this visit.   Subjective Assessment - 12/04/21 1148     Subjective Pt. indicated no complaints upon arrival today.  Pt. indicated feeling soreness in muscle after last visit but "that was good to feel."    Pertinent History unremarkable    Patient Stated Goals To get back to exercise and get back to work.    Currently in Pain? No/denies    Pain Score 0-No pain    Pain Onset 1 to 4 weeks ago                 [facility] PT Assessment - 12/04/21 0001       Assessment   Medical Diagnosis M25.561, G89.29    Referring Provider (PT) Danton Sewer MD    Onset Date/Surgical Date 10/16/21      AROM   Right Knee Extension 0   in seated LAQ     Strength   Strength Assessment Site Knee    Right/Left Knee Left;Right    Right Knee Extension 4/5   42 lbs   Left Knee Extension 5/5   100, 110 lbs                          [facility] Adult PT Treatment/Exercise - 12/04/21 0001       BFR-Supine   Supine Limb Occulsion Pressure (mmHg) 175    Supine Exercise Pressure (mmHg) 140      Knee/Hip Exercises: Aerobic   Recumbent Bike 10 mins Lvl 4  -seat 5      Knee/Hip Exercises: Standing   Other Standing Knee Exercises squat to chair touch, 3 x 10 c BFR 140 mmHg      Knee/Hip Exercises: Seated   Long Arc Quad Right   BFR 140 mmHg 30, 3 x 15 c 30 sec breaks   Long Arc  Quad Weight 2 lbs.    Other Seated Knee/Hip Exercises seated Rt SLR BFR 140 mmHG 30, 3 x 15 c 30 sec rest breaks                       PT Short Term Goals - 11/26/21 0942       PT SHORT TERM GOAL #1   Title be independent in initial HEP    Status Achieved               PT Long Term Goals - 11/26/21 9179       PT LONG TERM GOAL #1   Title be independent in advanced HEP    Status On-going      PT LONG TERM GOAL #2   Title report a 70% reduction in frequency and intensity of R knee pain with daily tasks and after weight training    Status On-going      PT LONG TERM GOAL #3   Title Increase R knee ROM to at least 0 to 125 to allow her to perform work activities.    Status On-going      PT LONG TERM GOAL #4   Title improve FOTO to > or = to 74 to improve funtion    Status On-going      PT LONG TERM GOAL #5   Title Increase R knee strength to 5/5 to allow pt to return to ability to perform leisure and work activities.    Status On-going                   Plan - 12/04/21 1213      Clinical Impression Statement Continued progression of quad strengthening in open and closed chain c various use of BFR.  Dynamometry assessment of quad strength provided today showing Rt strength < 50% of Lt.  Continued skilled PT services.    Examination-Activity Limitations Locomotion Level;Squat;Stairs;Stand    Examination-Participation Restrictions Occupation;Community Activity    Stability/Clinical Decision Making Stable/Uncomplicated    Rehab Potential Excellent    PT Frequency 2x / week    PT Duration 8 weeks    PT Treatment/Interventions ADLs/Self Care Home Management;Aquatic Therapy;Cryotherapy;Electrical Stimulation;Iontophoresis 4mg /ml Dexamethasone;Moist Heat;Gait training;Stair training;Functional mobility training;Therapeutic activities;Therapeutic exercise;Balance training;Neuromuscular re-education;Patient/family education;Manual techniques;Scar mobilization;Passive range of motion;Dry needling;Taping;Vasopneumatic Device;Joint Manipulations    PT Next Visit Plan BFR use continued, static balance control on non compliant/compliant surface.  SL leg press above 90 degrees c BFR possible.    PT Home Exercise Plan heel slides, SAQ, LAQ, quad sets, SLR, sidelying clams with band    Consulted and Agree with Plan of Care Patient             Patient will benefit from skilled therapeutic intervention in order to improve the following deficits and impairments:  Difficulty walking, Decreased range of motion, Increased muscle spasms, Pain, Decreased balance, Decreased strength  Visit Diagnosis: Chronic pain of right knee  Difficulty in walking, not elsewhere classified  Muscle weakness (generalized)  Cramp and spasm     Problem List Patient Active Problem List   Diagnosis Date Noted   Osteochondritis dissecans    Thyroid nodule 07/17/2019   Eczema 07/19/2017   Allergic rhinitis 07/19/2017    07/21/2017, PT, DPT, OCS, ATC 12/04/21  12:26 PM    [facility] [address] [place], [place], [place] Phone: [phone]   Fax:  [phone]  Name: Angelica Norman MRN: [name] Date of  Birth: [birth year]

## 2021-12-09 ENCOUNTER — Encounter: Payer: Self-pay | Admitting: Physical Therapy

## 2021-12-09 ENCOUNTER — Ambulatory Visit (INDEPENDENT_AMBULATORY_CARE_PROVIDER_SITE_OTHER): Payer: No Typology Code available for payment source | Admitting: Physical Therapy

## 2021-12-09 ENCOUNTER — Other Ambulatory Visit: Payer: Self-pay

## 2021-12-09 DIAGNOSIS — R252 Cramp and spasm: Secondary | ICD-10-CM

## 2021-12-09 DIAGNOSIS — M25561 Pain in right knee: Secondary | ICD-10-CM

## 2021-12-09 DIAGNOSIS — R262 Difficulty in walking, not elsewhere classified: Secondary | ICD-10-CM | POA: Diagnosis not present

## 2021-12-09 DIAGNOSIS — M6281 Muscle weakness (generalized): Secondary | ICD-10-CM | POA: Diagnosis not present

## 2021-12-09 DIAGNOSIS — G8929 Other chronic pain: Secondary | ICD-10-CM

## 2021-12-09 NOTE — Therapy (Signed)
[facility] [address] [place], [place], [address] Phone: [phone]   Fax:  [phone]  Physical Therapy Treatment  Patient Details  Name: Hailei Besser MRN: [id number] Date of Birth: [birth year] Referring Provider (PT): Danton Sewer MD   Encounter Date: 12/09/2021   PT End of Session - 12/09/21 1248     Visit Number 6    Date for PT Re-Evaluation 01/09/22    Authorization Type [facility] [employer]    PT Start Time 1145    PT Stop Time 1225    PT Time Calculation (min) 40 min    Activity Tolerance Patient tolerated treatment well    Behavior During Therapy [facility] for tasks assessed/performed             Past Medical History:  Diagnosis Date   Abnormal Pap smear of cervix 07/2015   Neg/Pos HR HPV--in [place]--no colpo/no treatment   Allergy    Eczema    Family history of adverse reaction to anesthesia    "it's hard for my dad to stay asleep during surgery"   Thyroid nodule 07/2018    Past Surgical History:  Procedure Laterality Date   HARVEST BONE GRAFT Right 10/16/2021   Procedure: HARVEST ILIAC BONE GRAFT;  Surgeon: Cammy Copa, MD;  Location: [facility] OR;  Service: Orthopedics;  Laterality: Right;   OSTEOCHONDRAL DEFECT REPAIR/RECONSTRUCTION Right 10/16/2021   Procedure: RIGHT KNEE OPEN OSTEOCHONDRAL DEFECT ALLOGRAFT;  Surgeon: Cammy Copa, MD;  Location: [facility] OR;  Service: Orthopedics;  Laterality: Right;   TYMPANOSTOMY TUBE PLACEMENT Bilateral     There were no vitals filed for this visit.   Subjective Assessment - 12/09/21 1247     Subjective Pt stating she has been walking better and feels he strength is coming back more.    Pertinent History unremarkable    Patient Stated Goals To get back to exercise and get back to work.    Currently in Pain? No/denies                [facility] PT Assessment - 12/09/21 0001       Assessment   Medical Diagnosis M25.561, G89.29    Referring Provider  (PT) Danton Sewer MD    Onset Date/Surgical Date 10/16/21      AROM   Right Knee Extension 0                           [facility] Adult PT Treatment/Exercise - 12/09/21 0001       Neuro Re-ed    Neuro Re-ed Details  standing on BOSU ball dome up: double leg stance x 60 seconds, SLS on right 4x 30 seconds      Blood Flow Restriction-Positions    Blood Flow Restriction Position Supine      BFR-Supine   Supine Limb Occulsion Pressure (mmHg) 175    Supine Exercise Pressure (mmHg) 140    Supine Exercise Prescription 30,15,15,15, reps w/ 30-60 sec rest      Knee/Hip Exercises: Aerobic   Recumbent Bike 10 mins Lvl 4  -seat 5      Knee/Hip Exercises: Standing   Other Standing Knee Exercises squat to chair touch, 3 x 10 c BFR 140 mmHg      Knee/Hip Exercises: Seated   Long Arc Quad Right   BFR 140 mmHg 30, 3 x 15 c 30 sec breaks   Long Arc Quad Weight 2 lbs.    Long  Arc Quad Limitations with BFR 140mmHG    Other Seated Knee/Hip Exercises seated Rt SLR BFR 140 mmHG 30, 3 x 15 c 30 sec rest breaks                       PT Short Term Goals - 11/26/21 0942       PT SHORT TERM GOAL #1   Title be independent in initial HEP    Status Achieved               PT Long Term Goals - 12/09/21 1253       PT LONG TERM GOAL #1   Title be independent in advanced HEP    Status On-going      PT LONG TERM GOAL #2   Title report a 70% reduction in frequency and intensity of R knee pain with daily tasks and after weight training    Status On-going      PT LONG TERM GOAL #3   Title Increase R knee ROM to at least 0 to 125 to allow her to perform work activities.    Status On-going      PT LONG TERM GOAL #4   Title improve FOTO to > or = to 74 to improve funtion    Status On-going      PT LONG TERM GOAL #5   Title Increase R knee strength to 5/5 to allow pt to return to ability to perform leisure and work activities.    Status On-going                    Plan - 12/09/21 1249     Clinical Impression Statement Pt arriving today with no pain in her right knee. Pt tolerating quad strengtheing using BFR in open and closed strengthening positions. Pt reporting improvements with ADL's. We added in more dynamic balance activities on BOSU ball today. Continue skilled PT to maximize pt's function.    Examination-Activity Limitations Locomotion Level;Squat;Stairs;Stand    Examination-Participation Restrictions Occupation;Community Activity    Stability/Clinical Decision Making Stable/Uncomplicated    Rehab Potential Excellent    PT Frequency 2x / week    PT Duration 8 weeks    PT Treatment/Interventions ADLs/Self Care Home Management;Aquatic Therapy;Cryotherapy;Electrical Stimulation;Iontophoresis 4mg /ml Dexamethasone;Moist Heat;Gait training;Stair training;Functional mobility training;Therapeutic activities;Therapeutic exercise;Balance training;Neuromuscular re-education;Patient/family education;Manual techniques;Scar mobilization;Passive range of motion;Dry needling;Taping;Vasopneumatic Device;Joint Manipulations    PT Next Visit Plan BFR use continued, staticdynamic balance control on non compliant/compliant surface.  SL leg press above 90 degrees c BFR possible.    PT Home Exercise Plan heel slides, SAQ, LAQ, quad sets, SLR, sidelying clams with band, standing squats to chair height    Consulted and Agree with Plan of Care Patient             Patient will benefit from skilled therapeutic intervention in order to improve the following deficits and impairments:  Difficulty walking, Decreased range of motion, Increased muscle spasms, Pain, Decreased balance, Decreased strength  Visit Diagnosis: Chronic pain of right knee  Difficulty in walking, not elsewhere classified  Muscle weakness (generalized)  Cramp and spasm     Problem List Patient Active Problem List   Diagnosis Date Noted   Osteochondritis dissecans    Thyroid  nodule 07/17/2019   Eczema 07/19/2017   Allergic rhinitis 07/19/2017    Sharmon LeydenJennifer R Arvind Mexicano, PT, MPT 12/09/2021, 1:01 PM  [facility] [address] [place], [place], [address] Phone: [phone]  Fax:  [phone]  Name: Burnett Lieber MRN: [id number] Date of Birth: [birth year]

## 2021-12-11 ENCOUNTER — Other Ambulatory Visit: Payer: Self-pay

## 2021-12-11 ENCOUNTER — Ambulatory Visit (INDEPENDENT_AMBULATORY_CARE_PROVIDER_SITE_OTHER): Payer: No Typology Code available for payment source | Admitting: Orthopedic Surgery

## 2021-12-11 DIAGNOSIS — G8929 Other chronic pain: Secondary | ICD-10-CM

## 2021-12-11 DIAGNOSIS — M25561 Pain in right knee: Secondary | ICD-10-CM

## 2021-12-12 ENCOUNTER — Encounter: Payer: Self-pay | Admitting: Physical Therapy

## 2021-12-12 ENCOUNTER — Ambulatory Visit (INDEPENDENT_AMBULATORY_CARE_PROVIDER_SITE_OTHER): Payer: No Typology Code available for payment source | Admitting: Physical Therapy

## 2021-12-12 DIAGNOSIS — G8929 Other chronic pain: Secondary | ICD-10-CM

## 2021-12-12 DIAGNOSIS — M25561 Pain in right knee: Secondary | ICD-10-CM | POA: Diagnosis not present

## 2021-12-12 DIAGNOSIS — R262 Difficulty in walking, not elsewhere classified: Secondary | ICD-10-CM | POA: Diagnosis not present

## 2021-12-12 DIAGNOSIS — M6281 Muscle weakness (generalized): Secondary | ICD-10-CM

## 2021-12-12 DIAGNOSIS — R252 Cramp and spasm: Secondary | ICD-10-CM

## 2021-12-12 NOTE — Therapy (Signed)
[facility] [address] [place], [place], [address] Phone: [phone]   Fax:  [phone]  Physical Therapy Treatment  Patient Details  Name: Angelica Norman MRN: [id number] Date of Birth: December 12, 1990 Referring Provider (PT): Valinda Party MD   Encounter Date: 12/12/2021   PT End of Session - 12/12/21 0843     Visit Number 7    Date for PT Re-Evaluation 01/09/22    Authorization Type [employer]    PT Start Time 0800    PT Stop Time 0843    PT Time Calculation (min) 43 min    Activity Tolerance Patient tolerated treatment well    Behavior During Therapy [facility] for tasks assessed/performed             Past Medical History:  Diagnosis Date   Abnormal Pap smear of cervix 07/2015   Neg/Pos HR HPV--in [place]--no colpo/no treatment   Allergy    Eczema    Family history of adverse reaction to anesthesia    "it's hard for my dad to stay asleep during surgery"   Thyroid nodule 07/2018    Past Surgical History:  Procedure Laterality Date   HARVEST BONE GRAFT Right 10/16/2021   Procedure: HARVEST ILIAC BONE GRAFT;  Surgeon: Meredith Pel, MD;  Location: [place];  Service: Orthopedics;  Laterality: Right;   OSTEOCHONDRAL DEFECT REPAIR/RECONSTRUCTION Right 10/16/2021   Procedure: RIGHT KNEE OPEN OSTEOCHONDRAL DEFECT ALLOGRAFT;  Surgeon: Meredith Pel, MD;  Location: [place];  Service: Orthopedics;  Laterality: Right;   TYMPANOSTOMY TUBE PLACEMENT Bilateral     There were no vitals filed for this visit.   Subjective Assessment - 12/12/21 0802     Subjective saw MD yesterday, okay for yoga and no loading for next 2 months    Pertinent History unremarkable    Patient Stated Goals To get back to exercise and get back to work.    Currently in Pain? No/denies                               [facility] Adult PT Treatment/Exercise - 12/12/21 0804       Knee/Hip Exercises: Aerobic   Recumbent  Bike L6 x 10 min      Knee/Hip Exercises: Standing   Other Standing Knee Exercises hip abduction into extension 2x20 reps bil; BFR 140 mmHg on Rt with L4 band      Knee/Hip Exercises: Seated   Long Arc Quad Right   BFR 140 mmHg 30, 3 x 15 c 30 sec breaks   Long Arc Quad Weight 5 lbs.    Long [employer] Limitations with BFR 132mmHG    Other Seated Knee/Hip Exercises seated Rt SLR BFR 140 mmHG 30, 3 x 15 c 30 sec rest breaks                       PT Short Term Goals - 11/26/21 0942       PT SHORT TERM GOAL #1   Title be independent in initial HEP    Status Achieved               PT Long Term Goals - 12/09/21 1253       PT LONG TERM GOAL #1   Title be independent in advanced HEP    Status On-going      PT LONG TERM GOAL #2   Title report a 70%  reduction in frequency and intensity of R knee pain with daily tasks and after weight training    Status On-going      PT LONG TERM GOAL #3   Title Increase R knee ROM to at least 0 to 125 to allow her to perform work activities.    Status On-going      PT LONG TERM GOAL #4   Title improve FOTO to > or = to 74 to improve funtion    Status On-going      PT LONG TERM GOAL #5   Title Increase R knee strength to 5/5 to allow pt to return to ability to perform leisure and work activities.    Status On-going                   Plan - 12/12/21 [id number]     Clinical Impression Statement Pt tolerated session well today with continued use of BFR to maximize quad strength without loading.  Will continue to benefit from PT to maximize function.    Examination-Activity Limitations Locomotion Level;Squat;Stairs;Stand    Examination-Participation Restrictions Occupation;Community Activity    Stability/Clinical Decision Making Stable/Uncomplicated    Rehab Potential Excellent    PT Frequency 2x / week    PT Duration 8 weeks    PT Treatment/Interventions ADLs/Self Care Home Management;Aquatic  Therapy;Cryotherapy;Electrical Stimulation;Iontophoresis 4mg /ml Dexamethasone;Moist Heat;Gait training;Stair training;Functional mobility training;Therapeutic activities;Therapeutic exercise;Balance training;Neuromuscular re-education;Patient/family education;Manual techniques;Scar mobilization;Passive range of motion;Dry needling;Taping;Vasopneumatic Device;Joint Manipulations    PT Next Visit Plan BFR use continued, staticdynamic balance control on non compliant/compliant surface.  BFR in standing (without heavy loading)    PT Home Exercise Plan heel slides, SAQ, LAQ, quad sets, SLR, sidelying clams with band, standing squats to chair height    Consulted and Agree with Plan of Care Patient             Patient will benefit from skilled therapeutic intervention in order to improve the following deficits and impairments:  Difficulty walking, Decreased range of motion, Increased muscle spasms, Pain, Decreased balance, Decreased strength  Visit Diagnosis: Chronic pain of right knee  Difficulty in walking, not elsewhere classified  Muscle weakness (generalized)  Cramp and spasm     Problem List Patient Active Problem List   Diagnosis Date Noted   Osteochondritis dissecans    Thyroid nodule 07/17/2019   Eczema 07/19/2017   Allergic rhinitis 07/19/2017     Laureen Abrahams, PT, DPT 12/12/21 8:45 AM    [facility] [address] [place], [place], [address] Phone: [phone]   Fax:  [phone]  Name: Angelica Norman MRN: [id number] Date of Birth: 04/22/1991

## 2021-12-15 ENCOUNTER — Encounter: Payer: Self-pay | Admitting: Physical Therapy

## 2021-12-15 ENCOUNTER — Ambulatory Visit: Payer: No Typology Code available for payment source | Admitting: Physical Therapy

## 2021-12-15 ENCOUNTER — Other Ambulatory Visit: Payer: Self-pay

## 2021-12-15 DIAGNOSIS — R252 Cramp and spasm: Secondary | ICD-10-CM

## 2021-12-15 DIAGNOSIS — M6281 Muscle weakness (generalized): Secondary | ICD-10-CM

## 2021-12-15 DIAGNOSIS — G8929 Other chronic pain: Secondary | ICD-10-CM

## 2021-12-15 DIAGNOSIS — R262 Difficulty in walking, not elsewhere classified: Secondary | ICD-10-CM

## 2021-12-15 DIAGNOSIS — M25561 Pain in right knee: Secondary | ICD-10-CM | POA: Diagnosis not present

## 2021-12-15 NOTE — Therapy (Signed)
[facility] [address] [place], [place], [address] Phone: [phone]   Fax:  [phone]  Physical Therapy Treatment  Patient Details  Name: Angelica KittenStephanie Michaela Norman MRN: [id number] Date of Birth: Mar 03, 1991 Referring Provider (PT): Danton SewerGregory Scoot Dean MD   Encounter Date: 12/15/2021   PT End of Session - 12/15/21 0821     Visit Number 8    Date for PT Re-Evaluation 01/09/22    Authorization Type [facility]    PT Start Time 0801    PT Stop Time 0844    PT Time Calculation (min) 43 min    Activity Tolerance Patient tolerated treatment well    Behavior During Therapy [facility] for tasks assessed/performed             Past Medical History:  Diagnosis Date   Abnormal Pap smear of cervix 07/2015   Neg/Pos HR HPV--in [place]--no colpo/no treatment   Allergy    Eczema    Family history of adverse reaction to anesthesia    "it's hard for my dad to stay asleep during surgery"   Thyroid nodule 07/2018    Past Surgical History:  Procedure Laterality Date   HARVEST BONE GRAFT Right 10/16/2021   Procedure: HARVEST ILIAC BONE GRAFT;  Surgeon: Cammy Copaean, Gregory Scott, MD;  [facility];  Service: Orthopedics;  Laterality: Right;   OSTEOCHONDRAL DEFECT REPAIR/RECONSTRUCTION Right 10/16/2021   Procedure: RIGHT KNEE OPEN OSTEOCHONDRAL DEFECT ALLOGRAFT;  Surgeon: Cammy Copaean, Gregory Scott, MD;  [facility];  Service: Orthopedics;  Laterality: Right;   TYMPANOSTOMY TUBE PLACEMENT Bilateral     There were no vitals filed for this visit.   Subjective Assessment - 12/15/21 0818     Subjective Pt arriving today reporting no pain and reqesting LE exercises that are safe for her protocol.    Pertinent History unremarkable    Limitations Walking    Currently in Pain? No/denies                [facility] - 12/15/21 0001       Assessment   Medical Diagnosis M25.561, G89.29    Referring Provider (PT) Danton SewerGregory Scoot Dean MD     Onset Date/Surgical Date 10/16/21                           [facility] Adult PT Treatment/Exercise - 12/15/21 0001       BFR-Supine   Supine Limb Occulsion Pressure (mmHg) 175    Supine Exercise Pressure (mmHg) 140    Supine Exercise Prescription 30,15,15,15, reps w/ 30-60 sec rest      Knee/Hip Exercises: Aerobic   Recumbent Bike L6 x 5 minutes      Knee/Hip Exercises: Machines for Strengthening   Cybex Leg Press Rt LE only 50# with BFR 30, 15, 15, 15      Knee/Hip Exercises: Standing   Other Standing Knee Exercises hip abduction with BFR with Level 3 band, 30, 15, 15, 15      Knee/Hip Exercises: Seated   Long Arc Quad Right   BFR 140 mmHg 30, 3 x 15 c 30 sec breaks   Long Arc Quad Weight 5 lbs.    Long [employer] Quad Limitations with BFR 140mmHG    Other Seated Knee/Hip Exercises seated Rt SLR BFR 140 mmHG 30, 3 x 15 c 30 sec rest breaks  PT Education - 12/15/21 0820     Education Details instructed pt in safe exercises for gym program for LE strengthening    Person(s) Educated Patient    Methods Explanation    Comprehension Verbalized understanding              PT Short Term Goals - 11/26/21 0942       PT SHORT TERM GOAL #1   Title be independent in initial HEP    Status Achieved               PT Long Term Goals - 12/15/21 0826       PT LONG TERM GOAL #1   Title be independent in advanced HEP    Status On-going      PT LONG TERM GOAL #2   Title report a 70% reduction in frequency and intensity of R knee pain with daily tasks and after weight training    Status On-going      PT LONG TERM GOAL #3   Title Increase R knee ROM to at least 0 to 125 to allow her to perform work activities.    Status On-going      PT LONG TERM GOAL #4   Title improve FOTO to > or = to 74 to improve funtion    Status On-going      PT LONG TERM GOAL #5   Title Increase R knee strength to 5/5 to allow pt to return to ability to  perform leisure and work activities.    Status On-going                   Plan - 12/15/21 1914     Clinical Impression Statement Pt tolerating exercises well today using BFR to maximize quad strength in closed kinetic chain activities. Pt stating that going down stairs is still difficult. Continue skilled PT to maximize function and progress toward LTG's.    Examination-Activity Limitations Locomotion Level;Squat;Stairs;Stand    Examination-Participation Restrictions Occupation;Community Activity    Stability/Clinical Decision Making Stable/Uncomplicated    Rehab Potential Excellent    PT Frequency 2x / week    PT Duration 8 weeks    PT Treatment/Interventions ADLs/Self Care Home Management;Aquatic Therapy;Cryotherapy;Electrical Stimulation;Iontophoresis 4mg /ml Dexamethasone;Moist Heat;Gait training;Stair training;Functional mobility training;Therapeutic activities;Therapeutic exercise;Balance training;Neuromuscular re-education;Patient/family education;Manual techniques;Scar mobilization;Passive range of motion;Dry needling;Taping;Vasopneumatic Device;Joint Manipulations    PT Next Visit Plan BFR use continued, staticdynamic balance control on non compliant/compliant surface.  BFR in standing (without heavy loading)    PT Home Exercise Plan heel slides, SAQ, LAQ, quad sets, SLR, sidelying clams with band, standing squats to chair height    Consulted and Agree with Plan of Care Patient             Patient will benefit from skilled therapeutic intervention in order to improve the following deficits and impairments:  Difficulty walking, Decreased range of motion, Increased muscle spasms, Pain, Decreased balance, Decreased strength  Visit Diagnosis: Chronic pain of right knee  Difficulty in walking, not elsewhere classified  Muscle weakness (generalized)  Cramp and spasm     Problem List Patient Active Problem List   Diagnosis Date Noted   Osteochondritis dissecans     Thyroid nodule 07/17/2019   Eczema 07/19/2017   Allergic rhinitis 07/19/2017    07/21/2017, PT, MPT 12/15/2021, 8:35 AM  [facility] Physical Therapy [address] [place], [place], [place] Phone: [phone]   Fax:  [phone]  Name: Angelica Norman MRN: Angelica Norman  Date of Birth: [birth year]

## 2021-12-16 ENCOUNTER — Encounter: Payer: Self-pay | Admitting: Orthopedic Surgery

## 2021-12-16 NOTE — Progress Notes (Signed)
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   Visit Diagnoses:  1. 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  She is doing well.  She has been full weightbearing for 2 weeks.  No stairs yet.  On exam she has full range of motion and improving quad strength.  No calf tenderness negative Homans.  Plan is sedentary duty from last appointment until today's appointment.  At this time it is okay for her to resume work with patient's up to minimal assistance in 4 weeks from today's clinic visit for the following 4 weeks.  We will see her back in 2 months with radiographs at that time.  I do not want her to do any loading type exercises on the graft yet but I think it is fine for her to do strengthening exercises stationary bike and walking.  By loading exercises and primarily thinking of impact type exercises.  Follow-Up Instructions: No follow-ups on file.   Orders:  No orders of the defined types were placed in this encounter.  No orders of the defined types were placed in this encounter.   Imaging: No results found.  PMFS History: Patient Active Problem List   Diagnosis Date Noted   Osteochondritis dissecans    Thyroid nodule 07/17/2019   Eczema 07/19/2017   Allergic rhinitis 07/19/2017   Past Medical History:  Diagnosis Date   Abnormal Pap smear of cervix 07/2015   Neg/Pos HR HPV--in [place]--no colpo/no treatment   Allergy    Eczema    Family history of adverse reaction to anesthesia    "it's hard for my dad to stay asleep during surgery"   Thyroid nodule 07/2018     Family History  Problem Relation Age of Onset   Arthritis Mother    Hyperlipidemia Mother    Diabetes Father    Cancer Father        prostate   Thyroid disease Father        hypothyroid   Heart disease Maternal Grandmother    Hyperlipidemia Maternal Grandmother    Stroke Maternal Grandmother    Stroke Paternal Grandmother    Hypertension Paternal Grandmother    Cancer Paternal Grandfather 65       Dec colon cancer    Past Surgical History:  Procedure Laterality Date   HARVEST BONE GRAFT Right 10/16/2021   Procedure: HARVEST ILIAC BONE GRAFT;  Surgeon: Cammy Copa, MD;  Location: [facility] OR;  Service: Orthopedics;  Laterality: Right;   OSTEOCHONDRAL DEFECT REPAIR/RECONSTRUCTION Right 10/16/2021   Procedure: RIGHT KNEE OPEN OSTEOCHONDRAL DEFECT ALLOGRAFT;  Surgeon: Cammy Copa, MD;  Location: [facility] OR;  Service: Orthopedics;  Laterality: Right;   TYMPANOSTOMY TUBE PLACEMENT Bilateral    Social History   Occupational History   Not on file  Tobacco Use   Smoking status: Never   Smokeless tobacco: Never  Vaping Use   Vaping Use: Never used  Substance and Sexual Activity   Alcohol use: Yes    Alcohol/week: 3.0 standard  drinks    Types: 3 Cans of beer per week   Drug use: No   Sexual activity: Yes    Partners: Male    Birth control/protection: Implant    Comment: Nexplanon inserted 10-26-14/Lt.arm

## 2021-12-17 ENCOUNTER — Encounter: Payer: Self-pay | Admitting: Physical Therapy

## 2021-12-17 ENCOUNTER — Ambulatory Visit: Payer: No Typology Code available for payment source | Admitting: Physical Therapy

## 2021-12-17 ENCOUNTER — Other Ambulatory Visit: Payer: Self-pay

## 2021-12-17 DIAGNOSIS — M25561 Pain in right knee: Secondary | ICD-10-CM | POA: Diagnosis not present

## 2021-12-17 DIAGNOSIS — R262 Difficulty in walking, not elsewhere classified: Secondary | ICD-10-CM | POA: Diagnosis not present

## 2021-12-17 DIAGNOSIS — M6281 Muscle weakness (generalized): Secondary | ICD-10-CM

## 2021-12-17 DIAGNOSIS — G8929 Other chronic pain: Secondary | ICD-10-CM | POA: Diagnosis not present

## 2021-12-17 NOTE — Therapy (Signed)
[facility] [address] [place], [place], [address] Phone: [phone]   Fax:  [phone]  Physical Therapy Treatment  Patient Details  Name: Angelica Norman MRN: [id number] Date of Birth: 25-Dec-1990 Referring Provider (PT): Valinda Party MD   Encounter Date: 12/17/2021   PT End of Session - 12/17/21 0827     Visit Number 9    Date for PT Re-Evaluation 01/09/22    Authorization Type [employer]    PT Start Time 0803    PT Stop Time 0843    PT Time Calculation (min) 40 min    Activity Tolerance Patient tolerated treatment well    Behavior During Therapy [facility] for tasks assessed/performed             Past Medical History:  Diagnosis Date   Abnormal Pap smear of cervix 07/2015   Neg/Pos HR HPV--in [place]--no colpo/no treatment   Allergy    Eczema    Family history of adverse reaction to anesthesia    "it's hard for my dad to stay asleep during surgery"   Thyroid nodule 07/2018    Past Surgical History:  Procedure Laterality Date   HARVEST BONE GRAFT Right 10/16/2021   Procedure: HARVEST ILIAC BONE GRAFT;  Surgeon: Meredith Pel, MD;  Location: [place];  Service: Orthopedics;  Laterality: Right;   OSTEOCHONDRAL DEFECT REPAIR/RECONSTRUCTION Right 10/16/2021   Procedure: RIGHT KNEE OPEN OSTEOCHONDRAL DEFECT ALLOGRAFT;  Surgeon: Meredith Pel, MD;  Location: [place];  Service: Orthopedics;  Laterality: Right;   TYMPANOSTOMY TUBE PLACEMENT Bilateral     There were no vitals filed for this visit.   Subjective Assessment - 12/17/21 0825     Subjective Pt arriving to therapy today reporting good response to last treatment.    Patient Stated Goals To get back to exercise and get back to work.    Currently in Pain? No/denies                               [facility] Adult PT Treatment/Exercise - 12/17/21 0001       BFR-Supine   Supine Limb Occulsion Pressure (mmHg) 175    Supine  Exercise Pressure (mmHg) 140    Supine Exercise Prescription 30,15,15,15, reps w/ 30-60 sec rest      Knee/Hip Exercises: Aerobic   Recumbent Bike L6 x 5 minutes   at end for cool down after BFR removal     Knee/Hip Exercises: Machines for Strengthening   Cybex Leg Press Rt LE only 50# with BFR 30, 15, 15, 15      Knee/Hip Exercises: Standing   Other Standing Knee Exercises side stepping with level 3 theraband around pt's thighs with BFR 140 mmHg x 30, 15, 15, 15, 15 each direction      Knee/Hip Exercises: Seated   Long Arc Quad Right   BFR 140 mmHg 30, 3 x 15 c 30 sec breaks   Long Arc Quad Weight 5 lbs.    Long [employer] Limitations with BFR 142mmHG    Other Seated Knee/Hip Exercises seated Rt SLR BFR 140 mmHG 30, 3 x 15 c 30 sec rest breaks    Other Seated Knee/Hip Exercises squats to 20 inch height chair with left LE positioned out front for increased weight bearing on right LE using BFR 140 mmHg 30, 15, 15, 15  PT Short Term Goals - 11/26/21 0942       PT SHORT TERM GOAL #1   Title be independent in initial HEP    Status Achieved               PT Long Term Goals - 12/15/21 [id number]       PT LONG TERM GOAL #1   Title be independent in advanced HEP    Status On-going      PT LONG TERM GOAL #2   Title report a 70% reduction in frequency and intensity of R knee pain with daily tasks and after weight training    Status On-going      PT LONG TERM GOAL #3   Title Increase R knee ROM to at least 0 to 125 to allow her to perform work activities.    Status On-going      PT LONG TERM GOAL #4   Title improve FOTO to > or = to 74 to improve funtion    Status On-going      PT LONG TERM GOAL #5   Title Increase R knee strength to 5/5 to allow pt to return to ability to perform leisure and work activities.    Status On-going                   Plan - 12/17/21 0830     Clinical Impression Statement Pt reporting good response to  her treatment Monday using BFR for quad strengthening. Pt with fatigue noted more today in right quad during BFR exercises. Conitnue skilled PT.    Examination-Activity Limitations Locomotion Level;Squat;Stairs;Stand    Examination-Participation Restrictions Occupation;Community Activity    Stability/Clinical Decision Making Stable/Uncomplicated    Rehab Potential Excellent    PT Frequency 2x / week    PT Duration 8 weeks    PT Treatment/Interventions ADLs/Self Care Home Management;Aquatic Therapy;Cryotherapy;Electrical Stimulation;Iontophoresis 4mg /ml Dexamethasone;Moist Heat;Gait training;Stair training;Functional mobility training;Therapeutic activities;Therapeutic exercise;Balance training;Neuromuscular re-education;Patient/family education;Manual techniques;Scar mobilization;Passive range of motion;Dry needling;Taping;Vasopneumatic Device;Joint Manipulations    PT Next Visit Plan BFR use continued, staticdynamic balance control on non compliant/compliant surface.  BFR in standing (without heavy loading)    PT Home Exercise Plan heel slides, SAQ, LAQ, quad sets, SLR, sidelying clams with band, standing squats to chair height    Consulted and Agree with Plan of Care Patient             Patient will benefit from skilled therapeutic intervention in order to improve the following deficits and impairments:  Difficulty walking, Decreased range of motion, Increased muscle spasms, Pain, Decreased balance, Decreased strength  Visit Diagnosis: Chronic pain of right knee  Difficulty in walking, not elsewhere classified  Muscle weakness (generalized)     Problem List Patient Active Problem List   Diagnosis Date Noted   Osteochondritis dissecans    Thyroid nodule 07/17/2019   Eczema 07/19/2017   Allergic rhinitis 07/19/2017    Oretha Caprice, PT, MPT 12/17/2021, 8:40 AM  [facility] [address] [place], [place], [address] Phone: [phone]    Fax:  [phone]  Name: Angelica Norman MRN: [id number] Date of Birth: 02/04/1991

## 2021-12-22 ENCOUNTER — Other Ambulatory Visit: Payer: Self-pay

## 2021-12-22 ENCOUNTER — Encounter: Payer: Self-pay | Admitting: Physical Therapy

## 2021-12-22 ENCOUNTER — Ambulatory Visit: Payer: No Typology Code available for payment source | Admitting: Physical Therapy

## 2021-12-22 DIAGNOSIS — R252 Cramp and spasm: Secondary | ICD-10-CM

## 2021-12-22 DIAGNOSIS — R262 Difficulty in walking, not elsewhere classified: Secondary | ICD-10-CM | POA: Diagnosis not present

## 2021-12-22 DIAGNOSIS — G8929 Other chronic pain: Secondary | ICD-10-CM

## 2021-12-22 DIAGNOSIS — M25561 Pain in right knee: Secondary | ICD-10-CM | POA: Diagnosis not present

## 2021-12-22 DIAGNOSIS — M6281 Muscle weakness (generalized): Secondary | ICD-10-CM | POA: Diagnosis not present

## 2021-12-22 NOTE — Therapy (Addendum)
[facility] Physical Therapy [address] [place], [place], [address] Phone: [phone]   Fax:  [phone]  Physical Therapy Treatment Progress Note  Patient Details  Name: Angelica Norman MRN: [id number] Date of Birth: [birth year] Referring Provider (PT): Valinda Party MD  Progress Note Reporting Period 11/14/2021 to 12/22/2021   See note below for Objective Data and Assessment of Progress/Goals.     Encounter Date: 12/22/2021   PT End of Session - 12/22/21 0813     Visit Number 10    Date for PT Re-Evaluation 01/09/22    Authorization Type [employer]    Authorization Time Period PN sent on 12/22/2021 at 10th visit    PT Start Time 0804    PT Stop Time 0845    PT Time Calculation (min) 41 min    Activity Tolerance Patient tolerated treatment well    Behavior During Therapy [facility] for tasks assessed/performed             Past Medical History:  Diagnosis Date   Abnormal Pap smear of cervix 07/2015   Neg/Pos HR HPV--in [place]--no colpo/no treatment   Allergy    Eczema    Family history of adverse reaction to anesthesia    "it's hard for my dad to stay asleep during surgery"   Thyroid nodule 07/2018    Past Surgical History:  Procedure Laterality Date   HARVEST BONE GRAFT Right 10/16/2021   Procedure: HARVEST ILIAC BONE GRAFT;  Surgeon: Meredith Pel, MD;  Location: [place];  Service: Orthopedics;  Laterality: Right;   OSTEOCHONDRAL DEFECT REPAIR/RECONSTRUCTION Right 10/16/2021   Procedure: RIGHT KNEE OPEN OSTEOCHONDRAL DEFECT ALLOGRAFT;  Surgeon: Meredith Pel, MD;  Location: [place];  Service: Orthopedics;  Laterality: Right;   TYMPANOSTOMY TUBE PLACEMENT Bilateral     There were no vitals filed for this visit.   Subjective Assessment - 12/22/21 0809     Subjective Pt arriving to therapy with no pain and good response to treatment las visit.    Patient Stated Goals To get back to exercise and get back to  work.    Currently in Pain? No/denies                [facility] PT Assessment - 12/22/21 0001       Assessment   Medical Diagnosis M25.561, G89.29    Referring Provider (PT) Valinda Party MD    Onset Date/Surgical Date 10/16/21      AROM   AROM Assessment Site Knee    Right/Left Knee Right;Left    Right Knee Extension 0    Right Knee Flexion 125    Left Knee Extension 0    Left Knee Flexion 150      PROM   Right Knee Extension 0    Right Knee Flexion 130      Strength   Right Knee Extension 4+/5    Left Knee Extension 5/5                           [facility] Adult PT Treatment/Exercise - 12/22/21 0001       BFR-Supine   Supine Limb Occulsion Pressure (mmHg) 175    Supine Exercise Pressure (mmHg) 140    Supine Exercise Prescription 30,15,15,15, reps w/ 30-60 sec rest      Knee/Hip Exercises: Machines for Strengthening   Cybex Leg Press Rt LE only: 62# with BFR 30, 3x15      Knee/Hip  Exercises: Standing   Forward Step Up Limitations 6 inch step using BRF 30, 3x 15, no UE support    SLS SLS on Airex: with BFR, 60 second hold, 30 seconds hold x 3      Knee/Hip Exercises: Seated   Other Seated Knee/Hip Exercises seated Rt SLR BFR 140 mmHG 30, 3 x 15 c 30 sec rest breaks    Other Seated Knee/Hip Exercises squats on Ariex mat using BFR 140 mmHg 30, 15, 15, 15                       PT Short Term Goals - 12/22/21 9147       PT SHORT TERM GOAL #1   Title be independent in initial HEP    Status Achieved      PT SHORT TERM GOAL #2   Title report a 30% reduction in frequency and intensity R knee pain with daily activity.    Status Achieved               PT Long Term Goals - 12/22/21 0815       PT LONG TERM GOAL #1   Title be independent in advanced HEP    Status On-going      PT LONG TERM GOAL #2   Title report a 70% reduction in frequency and intensity of R knee pain with daily tasks and after weight training    Baseline  Pt weight training within her precuations    Status Achieved      PT LONG TERM GOAL #3   Title Increase R knee ROM to at least 0 to 125 to allow her to perform work activities.    Status On-going      PT LONG TERM GOAL #4   Title improve FOTO to > or = to 74 to improve funtion    Baseline 49% at eval, 73% on 12/22/2021    Status On-going      PT LONG TERM GOAL #5   Title Increase R knee strength to 5/5 to allow pt to return to ability to perform leisure and work activities.    Status On-going                   Plan - 12/22/21 1044     Clinical Impression Statement Pt continues to have good response to treatment using BRF with improved reports of home and communtiy based activities. Pt feels like her overall strength is improving. Pt has met her ROM goal and FOTO goal.    Examination-Activity Limitations Locomotion Level;Squat;Stairs;Stand    Examination-Participation Restrictions Occupation;Community Activity    Stability/Clinical Decision Making Stable/Uncomplicated    Rehab Potential Excellent    PT Frequency 2x / week    PT Duration 8 weeks    PT Treatment/Interventions ADLs/Self Care Home Management;Aquatic Therapy;Cryotherapy;Electrical Stimulation;Iontophoresis 89m/ml Dexamethasone;Moist Heat;Gait training;Stair training;Functional mobility training;Therapeutic activities;Therapeutic exercise;Balance training;Neuromuscular re-education;Patient/family education;Manual techniques;Scar mobilization;Passive range of motion;Dry needling;Taping;Vasopneumatic Device;Joint Manipulations    PT Next Visit Plan BFR use continued, staticdynamic balance control on non compliant/compliant surface.  BFR in standing (without heavy loading)    PT Home Exercise Plan heel slides, SAQ, LAQ, quad sets, SLR, sidelying clams with band, standing squats to chair height    Consulted and Agree with Plan of Care Patient             Patient will benefit from skilled therapeutic intervention in  order to improve the following deficits and impairments:  Difficulty  walking, Decreased range of motion, Increased muscle spasms, Pain, Decreased balance, Decreased strength  Visit Diagnosis: Chronic pain of right knee  Difficulty in walking, not elsewhere classified  Muscle weakness (generalized)  Cramp and spasm     Problem List Patient Active Problem List   Diagnosis Date Noted   Osteochondritis dissecans    Thyroid nodule 07/17/2019   Eczema 07/19/2017   Allergic rhinitis 07/19/2017    Oretha Caprice, PT, MPT 12/22/2021, 1:02 PM  [facility] [address] [place], [place], [address] Phone: [phone]   Fax:  [phone]  Name: Shemicka Cohrs MRN: [id number] Date of Birth: [birth year]

## 2021-12-24 ENCOUNTER — Encounter: Payer: Self-pay | Admitting: Physical Therapy

## 2021-12-24 ENCOUNTER — Other Ambulatory Visit: Payer: Self-pay

## 2021-12-24 ENCOUNTER — Ambulatory Visit: Payer: No Typology Code available for payment source | Admitting: Physical Therapy

## 2021-12-24 DIAGNOSIS — R262 Difficulty in walking, not elsewhere classified: Secondary | ICD-10-CM

## 2021-12-24 DIAGNOSIS — M25561 Pain in right knee: Secondary | ICD-10-CM

## 2021-12-24 DIAGNOSIS — G8929 Other chronic pain: Secondary | ICD-10-CM

## 2021-12-24 DIAGNOSIS — R252 Cramp and spasm: Secondary | ICD-10-CM | POA: Diagnosis not present

## 2021-12-24 DIAGNOSIS — M6281 Muscle weakness (generalized): Secondary | ICD-10-CM

## 2021-12-24 NOTE — Therapy (Signed)
[facility] Physical Therapy [address] [place], [place], [address] Phone: [phone]   Fax:  [phone]  Physical Therapy Treatment  Patient Details  Name: Angelica Norman MRN: [id number] Date of Birth: [birth year] Referring Provider (PT): Danton Sewer MD   Encounter Date: 12/24/2021   PT End of Session - 12/24/21 0810     Visit Number 11    Date for PT Re-Evaluation 01/09/22    Authorization Type [facility]    Authorization Time Period PN sent on 12/22/2021 at 10th visit    PT Start Time 0802    PT Stop Time 0845    PT Time Calculation (min) 43 min    Activity Tolerance Patient tolerated treatment well    Behavior During Therapy [name] for tasks assessed/performed             Past Medical History:  Diagnosis Date   Abnormal Pap smear of cervix 07/2015   Neg/Pos HR HPV--in [place]--no colpo/no treatment   Allergy    Eczema    Family history of adverse reaction to anesthesia    "it's hard for my dad to stay asleep during surgery"   Thyroid nodule 07/2018    Past Surgical History:  Procedure Laterality Date   HARVEST BONE GRAFT Right 10/16/2021   Procedure: HARVEST ILIAC BONE GRAFT;  Surgeon: Cammy Copa, MD;  Location: [facility] OR;  Service: Orthopedics;  Laterality: Right;   OSTEOCHONDRAL DEFECT REPAIR/RECONSTRUCTION Right 10/16/2021   Procedure: RIGHT KNEE OPEN OSTEOCHONDRAL DEFECT ALLOGRAFT;  Surgeon: Cammy Copa, MD;  Location: [facility] OR;  Service: Orthopedics;  Laterality: Right;   TYMPANOSTOMY TUBE PLACEMENT Bilateral     There were no vitals filed for this visit.   Subjective Assessment - 12/24/21 0807     Subjective Pt arriving today reporting a "good" pain following her last visit. Pt stating she participated in a water class yesterday with some limitations.    Pertinent History unremarkable    Patient Stated Goals To get back to exercise and get back to work.    Currently in Pain? No/denies                                [name] - 12/24/21 0001       BFR-Supine   Supine Limb Occulsion Pressure (mmHg) 175    Supine Exercise Pressure (mmHg) 140    Supine Exercise Prescription 30,15,15,15, reps w/ 30-60 sec rest      Knee/Hip Exercises: Aerobic   Recumbent Bike L5 seat 4 with BFR on R LE set at for 10 minutes      Knee/Hip Exercises: Machines for Strengthening   Cybex Leg Press Rt LE only: 62# with BFR 30, 3x15      Knee/Hip Exercises: Standing   Forward Step Up Limitations 8 inch step ups with BFR 140 mmHg x30, 3x15    Other Standing Knee Exercises declined squats with BFR x30, 3x15      Vasopneumatic   Number Minutes Vasopneumatic  10 minutes    Vasopnuematic Location  Knee    Vasopneumatic Pressure Medium    Vasopneumatic Temperature  low                       PT Short Term Goals - 12/22/21 9373       PT SHORT TERM GOAL #1   Title be independent in initial HEP  Status Achieved      PT SHORT TERM GOAL #2   Title report a 30% reduction in frequency and intensity R knee pain with daily activity.    Status Achieved               PT Long Term Goals - 12/22/21 0815       PT LONG TERM GOAL #1   Title be independent in advanced HEP    Status On-going      PT LONG TERM GOAL #2   Title report a 70% reduction in frequency and intensity of R knee pain with daily tasks and after weight training    Baseline Pt weight training within her precuations    Status Achieved      PT LONG TERM GOAL #3   Title Increase R knee ROM to at least 0 to 125 to allow her to perform work activities.    Status On-going      PT LONG TERM GOAL #4   Title improve FOTO to > or = to 74 to improve funtion    Baseline 49% at eval, 73% on 12/22/2021    Status On-going      PT LONG TERM GOAL #5   Title Increase R knee strength to 5/5 to allow pt to return to ability to perform leisure and work activities.     Status On-going                   Plan - 12/24/21 0830     Clinical Impression Statement Pt tolerating BFR exercises with increased difficulty. Pt contiues to improve with overall functional mobility and progressing toward return to work related activities.    Examination-Activity Limitations Locomotion Level;Squat;Stairs;Stand    Examination-Participation Restrictions Occupation;Community Activity    Stability/Clinical Decision Making Stable/Uncomplicated    Rehab Potential Excellent    PT Frequency 1x / week    PT Duration 8 weeks    PT Treatment/Interventions ADLs/Self Care Home Management;Aquatic Therapy;Cryotherapy;Electrical Stimulation;Iontophoresis 4mg /ml Dexamethasone;Moist Heat;Gait training;Stair training;Functional mobility training;Therapeutic activities;Therapeutic exercise;Balance training;Neuromuscular re-education;Patient/family education;Manual techniques;Scar mobilization;Passive range of motion;Dry needling;Taping;Vasopneumatic Device;Joint Manipulations    PT Next Visit Plan BFR use continued, staticdynamic balance control on non compliant/compliant surface.  BFR in standing (without heavy loading)    PT Home Exercise Plan heel slides, SAQ, LAQ, quad sets, SLR, sidelying clams with band, standing squats to chair height    Consulted and Agree with Plan of Care Patient             Patient will benefit from skilled therapeutic intervention in order to improve the following deficits and impairments:  Difficulty walking, Decreased range of motion, Increased muscle spasms, Pain, Decreased balance, Decreased strength  Visit Diagnosis: Chronic pain of right knee  Difficulty in walking, not elsewhere classified  Muscle weakness (generalized)  Cramp and spasm     Problem List Patient Active Problem List   Diagnosis Date Noted   Osteochondritis dissecans    Thyroid nodule 07/17/2019   Eczema 07/19/2017   Allergic rhinitis 07/19/2017    07/21/2017, PT, MPT 12/24/2021, 8:38 AM  [facility] Physical Therapy [address] [place], [place], [place] Phone: [phone]   Fax:  [phone]  Name: Angelica Norman MRN: [name] Date of Birth: [birth year]

## 2021-12-29 ENCOUNTER — Ambulatory Visit: Payer: No Typology Code available for payment source | Admitting: Physical Therapy

## 2021-12-29 ENCOUNTER — Other Ambulatory Visit: Payer: Self-pay

## 2021-12-29 ENCOUNTER — Encounter: Payer: Self-pay | Admitting: Physical Therapy

## 2021-12-29 DIAGNOSIS — M25561 Pain in right knee: Secondary | ICD-10-CM | POA: Diagnosis not present

## 2021-12-29 DIAGNOSIS — R262 Difficulty in walking, not elsewhere classified: Secondary | ICD-10-CM

## 2021-12-29 DIAGNOSIS — R252 Cramp and spasm: Secondary | ICD-10-CM

## 2021-12-29 DIAGNOSIS — G8929 Other chronic pain: Secondary | ICD-10-CM

## 2021-12-29 DIAGNOSIS — M6281 Muscle weakness (generalized): Secondary | ICD-10-CM

## 2021-12-29 NOTE — Therapy (Signed)
[facility] Physical Therapy [address] [place], [place], [address] Phone: [phone]   Fax:  [phone]  Physical Therapy Treatment  Patient Details  Name: Angelica Norman MRN: [id number] Date of Birth: 20-Sep-1991 Referring Provider (PT): Danton Sewer MD   Encounter Date: 12/29/2021   PT End of Session - 12/29/21 0841     Visit Number 12    Date for PT Re-Evaluation 01/09/22    Authorization Type [facility]    Authorization Time Period PN sent on 12/22/2021 at 10th visit    PT Start Time 0800    PT Stop Time 0841    PT Time Calculation (min) 41 min    Activity Tolerance Patient tolerated treatment well    Behavior During Therapy [facility] for tasks assessed/performed             Past Medical History:  Diagnosis Date   Abnormal Pap smear of cervix 07/2015   Neg/Pos HR HPV--in [place]--no colpo/no treatment   Allergy    Eczema    Family history of adverse reaction to anesthesia    "it's hard for my dad to stay asleep during surgery"   Thyroid nodule 07/2018    Past Surgical History:  Procedure Laterality Date   HARVEST BONE GRAFT Right 10/16/2021   Procedure: HARVEST ILIAC BONE GRAFT;  Surgeon: Cammy Copa, MD;  Location: [facility] OR;  Service: Orthopedics;  Laterality: Right;   OSTEOCHONDRAL DEFECT REPAIR/RECONSTRUCTION Right 10/16/2021   Procedure: RIGHT KNEE OPEN OSTEOCHONDRAL DEFECT ALLOGRAFT;  Surgeon: Cammy Copa, MD;  Location: [facility] OR;  Service: Orthopedics;  Laterality: Right;   TYMPANOSTOMY TUBE PLACEMENT Bilateral     There were no vitals filed for this visit.   Subjective Assessment - 12/29/21 0841     Subjective doing well had some ant knee pain this weekend but now resolved    Pertinent History unremarkable    Patient Stated Goals To get back to exercise and get back to work.    Currently in Pain? No/denies                               [facility] Adult PT Treatment/Exercise -  12/29/21 0801       Blood Flow Restriction-Positions    Blood Flow Restriction Position Standing      BFR-Supine   Supine Limb Occulsion Pressure (mmHg) --    Supine Exercise Pressure (mmHg) --    Supine Exercise Prescription --      BFR Standing   Standing Limb Occulsion Pressure (mmHg) 220    Standing Exercise Pressure (mmHg) 176    Standing Exercise Prescription 30,15,15,15, reps w/ 30-60 sec rest    Standing Exercise Prescription Comment cuff size 4      Knee/Hip Exercises: Aerobic   Recumbent Bike L5 seat 4 with BFR on R LE set at 158 mmHg for 10 minutes, RPM > 50 target      Knee/Hip Exercises: Standing   Forward Step Up Limitations 8 inch step ups with BFR 176 mmHg x30, 3x15    Step Down Limitations LLE heel taps off 6" step with BFR    Other Standing Knee Exercises RLE single limb deadlift with 12# KB with BFR                       PT Short Term Goals - 12/22/21 0814       PT SHORT TERM GOAL #  1   Title be independent in initial HEP    Status Achieved      PT SHORT TERM GOAL #2   Title report a 30% reduction in frequency and intensity R knee pain with daily activity.    Status Achieved               PT Long Term Goals - 12/22/21 0815       PT LONG TERM GOAL #1   Title be independent in advanced HEP    Status On-going      PT LONG TERM GOAL #2   Title report a 70% reduction in frequency and intensity of R knee pain with daily tasks and after weight training    Baseline Pt weight training within her precuations    Status Achieved      PT LONG TERM GOAL #3   Title Increase R knee ROM to at least 0 to 125 to allow her to perform work activities.    Status On-going      PT LONG TERM GOAL #4   Title improve FOTO to > or = to 74 to improve funtion    Baseline 49% at eval, 73% on 12/22/2021    Status On-going      PT LONG TERM GOAL #5   Title Increase R knee strength to 5/5 to allow pt to return to ability to perform leisure and work  activities.    Status On-going                   Plan - 12/29/21 5366     Clinical Impression Statement Continued use of BFR today with noticable fatigue noted with eccentric quad control.  Will continue to benefit from PT to maximize function.    Examination-Activity Limitations Locomotion Level;Squat;Stairs;Stand    Examination-Participation Restrictions Occupation;Community Activity    Stability/Clinical Decision Making Stable/Uncomplicated    Rehab Potential Excellent    PT Frequency 1x / week    PT Duration 8 weeks    PT Treatment/Interventions ADLs/Self Care Home Management;Aquatic Therapy;Cryotherapy;Electrical Stimulation;Iontophoresis 4mg /ml Dexamethasone;Moist Heat;Gait training;Stair training;Functional mobility training;Therapeutic activities;Therapeutic exercise;Balance training;Neuromuscular re-education;Patient/family education;Manual techniques;Scar mobilization;Passive range of motion;Dry needling;Taping;Vasopneumatic Device;Joint Manipulations    PT Next Visit Plan BFR use continued, staticdynamic balance control on non compliant/compliant surface.  BFR in standing (without heavy loading), review/update HEP    PT Home Exercise Plan heel slides, SAQ, LAQ, quad sets, SLR, sidelying clams with band, standing squats to chair height    Consulted and Agree with Plan of Care Patient             Patient will benefit from skilled therapeutic intervention in order to improve the following deficits and impairments:  Difficulty walking, Decreased range of motion, Increased muscle spasms, Pain, Decreased balance, Decreased strength  Visit Diagnosis: Chronic pain of right knee  Difficulty in walking, not elsewhere classified  Muscle weakness (generalized)  Cramp and spasm     Problem List Patient Active Problem List   Diagnosis Date Noted   Osteochondritis dissecans    Thyroid nodule 07/17/2019   Eczema 07/19/2017   Allergic rhinitis 07/19/2017       07/21/2017, PT, DPT 12/29/21 8:43 AM     [facility] Physical Therapy [address] [place], [place], [place] Phone: [phone]   Fax:  [phone]  Name: Hava Massingale MRN: [name] Date of Birth: 1991/04/24

## 2021-12-31 ENCOUNTER — Ambulatory Visit: Payer: No Typology Code available for payment source | Admitting: Rehabilitative and Restorative Service Providers"

## 2021-12-31 ENCOUNTER — Encounter: Payer: Self-pay | Admitting: Rehabilitative and Restorative Service Providers"

## 2021-12-31 ENCOUNTER — Other Ambulatory Visit: Payer: Self-pay

## 2021-12-31 DIAGNOSIS — M6281 Muscle weakness (generalized): Secondary | ICD-10-CM | POA: Diagnosis not present

## 2021-12-31 DIAGNOSIS — R262 Difficulty in walking, not elsewhere classified: Secondary | ICD-10-CM | POA: Diagnosis not present

## 2021-12-31 DIAGNOSIS — G8929 Other chronic pain: Secondary | ICD-10-CM

## 2021-12-31 DIAGNOSIS — R252 Cramp and spasm: Secondary | ICD-10-CM

## 2021-12-31 DIAGNOSIS — M25561 Pain in right knee: Secondary | ICD-10-CM

## 2021-12-31 NOTE — Therapy (Signed)
[facility] Physical Therapy [address] [place], [place], [address] Phone: [phone]   Fax:  [phone]  Physical Therapy Treatment  Patient Details  Name: Angelica Norman MRN: [id number] Date of Birth: [birth year] Referring Provider (PT): Danton SewerGregory Scoot Dean MD   Encounter Date: 12/31/2021   PT End of Session - 12/31/21 0806     Visit Number 13    Date for PT Re-Evaluation 01/09/22    Authorization Type [facility]    Authorization Time Period PN sent on 12/22/2021 at 10th visit    PT Start Time 0800    PT Stop Time 0856    PT Time Calculation (min) 56 min    Activity Tolerance Patient tolerated treatment well    Behavior [facility] for tasks assessed/performed             Past Medical History:  Diagnosis Date   Abnormal Pap smear of cervix 07/2015   Neg/Pos HR HPV--in [place]--no colpo/no treatment   Allergy    Eczema    Family history of adverse reaction to anesthesia    "it's hard for my dad to stay asleep during surgery"   Thyroid nodule 07/2018    Past Surgical History:  Procedure Laterality Date   HARVEST BONE GRAFT Right 10/16/2021   Procedure: HARVEST ILIAC BONE GRAFT;  Surgeon: Cammy Copaean, Gregory Scott, MD;  [facility];  Service: Orthopedics;  Laterality: Right;   OSTEOCHONDRAL DEFECT REPAIR/RECONSTRUCTION Right 10/16/2021   Procedure: RIGHT KNEE OPEN OSTEOCHONDRAL DEFECT ALLOGRAFT;  Surgeon: Cammy Copaean, Gregory Scott, MD;  [facility] OR;  Service: Orthopedics;  Laterality: Right;   TYMPANOSTOMY TUBE PLACEMENT Bilateral     There were no vitals filed for this visit.   Subjective Assessment - 12/31/21 0805     Subjective Pt. indicated no pain complaints.  Pt. indicated having a instances of feeling like fluid was squirted inside knee yesterday (not painful).    Pertinent History unremarkable    Patient Stated Goals To get back to exercise and get back to work.    Currently in Pain? No/denies    Pain Score  0-No pain                [facility] PT Assessment - 12/31/21 0001       Assessment   Medical Diagnosis M25.561, G89.29    Referring Provider (PT) Danton SewerGregory Scoot Dean MD    Onset Date/Surgical Date 10/16/21    Hand Dominance Right      Strength   Right Knee Extension 5/5   68.3,63.3 lbs                          [facility] Adult PT Treatment/Exercise - 12/31/21 0001       Neuro Re-ed    Neuro Re-ed Details  SLS on airex foam vector taps (anterior, lateral, posterior) x 10 bilateral, fitter board fwd/back double leg 20x, single leg 20x each      BFR Standing   Standing Limb Occulsion Pressure (mmHg) 220    Standing Exercise Pressure (mmHg) 176    Standing Exercise Prescription Comment cuff size 4      Knee/Hip Exercises: Aerobic   Tread Mill reverse ambulation .8-1.2 mph 2 % incline 5 mins c focus on large steps    Recumbent Bike Lvl 3 4 mins post activity      Knee/Hip Exercises: Machines for Strengthening   Cybex Leg Press back flat BFR 176 mmHg Rt leg 75  lbs 30x, 81 lbs 3 x 15      Knee/Hip Exercises: Standing   Other Standing Knee Exercises RLE single limb deadlift with 12# KB with BFR 174 lbs 30x, 3 x 15    Other Standing Knee Exercises wall squat in 60 deg flexion BFR 174 mmHg to fatigue 1:45, 2:41, split squat on Rt leg BFR 174 mmHg 30, 3 x 15                       PT Short Term Goals - 12/22/21 7829       PT SHORT TERM GOAL #1   Title be independent in initial HEP    Status Achieved      PT SHORT TERM GOAL #2   Title report a 30% reduction in frequency and intensity R knee pain with daily activity.    Status Achieved               PT Long Term Goals - 12/22/21 0815       PT LONG TERM GOAL #1   Title be independent in advanced HEP    Status On-going      PT LONG TERM GOAL #2   Title report a 70% reduction in frequency and intensity of R knee pain with daily tasks and after weight training    Baseline Pt weight training  within her precuations    Status Achieved      PT LONG TERM GOAL #3   Title Increase R knee ROM to at least 0 to 125 to allow her to perform work activities.    Status On-going      PT LONG TERM GOAL #4   Title improve FOTO to > or = to 74 to improve funtion    Baseline 49% at eval, 73% on 12/22/2021    Status On-going      PT LONG TERM GOAL #5   Title Increase R knee strength to 5/5 to allow pt to return to ability to perform leisure and work activities.    Status On-going                   Plan - 12/31/21 5621     Clinical Impression Statement Pt. to continue to benefit from interventions focused on continued strengthening and dynamic control to facilitate progression towards long term goals.  Pt. has demonstrated good tolerance and benefit from use of BFR in activity to this point.  Rt leg does continue to show deficits in motor control in dynamic movement compared to Lt.  Rt quad strength testing improved c dynamometer reading but still lacking compared to Lt.    Examination-Activity Limitations Locomotion Level;Squat;Stairs;Stand    Examination-Participation Restrictions Occupation;Community Activity    Stability/Clinical Decision Making Stable/Uncomplicated    Rehab Potential Excellent    PT Frequency 1x / week    PT Duration 8 weeks    PT Treatment/Interventions ADLs/Self Care Home Management;Aquatic Therapy;Cryotherapy;Electrical Stimulation;Iontophoresis 4mg /ml Dexamethasone;Moist Heat;Gait training;Stair training;Functional mobility training;Therapeutic activities;Therapeutic exercise;Balance training;Neuromuscular re-education;Patient/family education;Manual techniques;Scar mobilization;Passive range of motion;Dry needling;Taping;Vasopneumatic Device;Joint Manipulations    PT Next Visit Plan Continued strengthening program with dynamic and compliant surface balance control.    PT Home Exercise Plan heel slides, SAQ, LAQ, quad sets, SLR, sidelying clams with band,  standing squats to chair height    Consulted and Agree with Plan of Care Patient             Patient will benefit from skilled therapeutic intervention  in order to improve the following deficits and impairments:  Difficulty walking, Decreased range of motion, Increased muscle spasms, Pain, Decreased balance, Decreased strength  Visit Diagnosis: Chronic pain of right knee  Difficulty in walking, not elsewhere classified  Muscle weakness (generalized)  Cramp and spasm     Problem List Patient Active Problem List   Diagnosis Date Noted   Osteochondritis dissecans    Thyroid nodule 07/17/2019   Eczema 07/19/2017   Allergic rhinitis 07/19/2017    Chyrel Masson, PT, DPT, OCS, ATC 12/31/21  8:54 AM     [facility] Physical Therapy [address] [place], [place], [address] Phone: [phone]   Fax:  [phone]  Name: Angelica Norman MRN: [id number] Date of Birth: Jun 24, 1991

## 2022-01-01 ENCOUNTER — Encounter: Payer: Self-pay | Admitting: Orthopedic Surgery

## 2022-01-01 NOTE — Telephone Encounter (Signed)
Letter looks perfect can you send to whoever needs to go to [facility]

## 2022-01-05 ENCOUNTER — Other Ambulatory Visit: Payer: Self-pay

## 2022-01-05 ENCOUNTER — Ambulatory Visit: Payer: No Typology Code available for payment source | Admitting: Physical Therapy

## 2022-01-05 ENCOUNTER — Encounter: Payer: Self-pay | Admitting: Physical Therapy

## 2022-01-05 DIAGNOSIS — G8929 Other chronic pain: Secondary | ICD-10-CM

## 2022-01-05 DIAGNOSIS — M6281 Muscle weakness (generalized): Secondary | ICD-10-CM

## 2022-01-05 DIAGNOSIS — R252 Cramp and spasm: Secondary | ICD-10-CM

## 2022-01-05 DIAGNOSIS — R262 Difficulty in walking, not elsewhere classified: Secondary | ICD-10-CM

## 2022-01-05 DIAGNOSIS — M25561 Pain in right knee: Secondary | ICD-10-CM

## 2022-01-05 NOTE — Therapy (Signed)
[facility] Physical Therapy [address] [place], [place], [address] Phone: [phone]   Fax:  [phone]  Physical Therapy Treatment  Patient Details  Name: Angelica Norman MRN: [id number] Date of Birth: 25-Sep-1991 Referring Provider (PT): Danton Sewer MD   Encounter Date: 01/05/2022   PT End of Session - 01/05/22 0831     Visit Number 14    Date for PT Re-Evaluation 01/09/22    Authorization Type [facility]    Authorization Time Period PN sent on 12/22/2021 at 10th visit    PT Start Time 0800    PT Stop Time 0845    PT Time Calculation (min) 45 min    Activity Tolerance Patient tolerated treatment well    Behavior During Therapy [facility] for tasks assessed/performed             Past Medical History:  Diagnosis Date   Abnormal Pap smear of cervix 07/2015   Neg/Pos HR HPV--in [place]--no colpo/no treatment   Allergy    Eczema    Family history of adverse reaction to anesthesia    "it's hard for my dad to stay asleep during surgery"   Thyroid nodule 07/2018    Past Surgical History:  Procedure Laterality Date   HARVEST BONE GRAFT Right 10/16/2021   Procedure: HARVEST ILIAC BONE GRAFT;  Surgeon: Cammy Copa, MD;  Location: [facility] OR;  Service: Orthopedics;  Laterality: Right;   OSTEOCHONDRAL DEFECT REPAIR/RECONSTRUCTION Right 10/16/2021   Procedure: RIGHT KNEE OPEN OSTEOCHONDRAL DEFECT ALLOGRAFT;  Surgeon: Cammy Copa, MD;  Location: [facility] OR;  Service: Orthopedics;  Laterality: Right;   TYMPANOSTOMY TUBE PLACEMENT Bilateral     There were no vitals filed for this visit.   Subjective Assessment - 01/05/22 0828     Subjective Pt arriving today reporting increased edema in her lateral right knee. Pt reporting it feels like the fluid/edema is displacing from side to side with flexion.    Patient Stated Goals To get back to exercise and get back to work.    Currently in Pain? No/denies                [facility]  PT Assessment - 01/05/22 0001       Assessment   Medical Diagnosis M25.561, G89.29    Referring Provider (PT) Danton Sewer MD    Onset Date/Surgical Date 10/16/21    Hand Dominance Right                           [facility] Adult PT Treatment/Exercise - 01/05/22 0001       Blood Flow Restriction-Positions    Blood Flow Restriction Position Standing      BFR Standing   Standing Limb Occulsion Pressure (mmHg) 220    Standing Exercise Pressure (mmHg) 176    Standing Exercise Prescription Comment cuff size 4      Knee/Hip Exercises: Aerobic   Recumbent Bike Lvl 3 4 mins post activity      Knee/Hip Exercises: Machines for Strengthening   Cybex Leg Press BFR 176 mmHg Rt leg 75 lbs 30x, 81 lbs 3 x 15      Knee/Hip Exercises: Standing   Other Standing Knee Exercises RLE single limb deadlift with 12# KB with BFR 174 lbs 30x, 3 x 15    Other Standing Knee Exercises wall squat in 60 degrees of flexion with BFR at 176 mmHg to fatigue : 1:20, 1:26, 1:12  PT Education - 01/05/22 0829     Education Details Discussed contiued POC    Person(s) Educated Patient    Methods Explanation    Comprehension Verbalized understanding              PT Short Term Goals - 12/22/21 0814       PT SHORT TERM GOAL #1   Title be independent in initial HEP    Status Achieved      PT SHORT TERM GOAL #2   Title report a 30% reduction in frequency and intensity R knee pain with daily activity.    Status Achieved               PT Long Term Goals - 01/05/22 [id number]       PT LONG TERM GOAL #1   Title be independent in advanced HEP    Status On-going      PT LONG TERM GOAL #2   Title report a 70% reduction in frequency and intensity of R knee pain with daily tasks and after weight training    Status Achieved      PT LONG TERM GOAL #3   Title Increase R knee ROM to at least 0 to 125 to allow her to perform work activities.    Status  On-going      PT LONG TERM GOAL #4   Title improve FOTO to > or = to 74 to improve funtion    Status On-going      PT LONG TERM GOAL #5   Title Increase R knee strength to 5/5 to allow pt to return to ability to perform leisure and work activities.    Status On-going                   Plan - 01/05/22 0831     Clinical Impression Statement Pt is continuing to progress with overall strengthening and SLS control. Pt is tolerating BFR treatments well with increased in quad girth moving from level 3 cuff to level 4. Pt still with comparative weakness in her right LE to her left. Conitnue skilled PT to progress toward maximizing function and return to full function at work.    Examination-Activity Limitations Locomotion Level;Squat;Stairs;Stand    Examination-Participation Restrictions Occupation;Community Activity    Stability/Clinical Decision Making Stable/Uncomplicated    Rehab Potential Excellent    PT Frequency 1x / week    PT Duration 8 weeks    PT Treatment/Interventions ADLs/Self Care Home Management;Aquatic Therapy;Cryotherapy;Electrical Stimulation;Iontophoresis 4mg /ml Dexamethasone;Moist Heat;Gait training;Stair training;Functional mobility training;Therapeutic activities;Therapeutic exercise;Balance training;Neuromuscular re-education;Patient/family education;Manual techniques;Scar mobilization;Passive range of motion;Dry needling;Taping;Vasopneumatic Device;Joint Manipulations    PT Next Visit Plan Continued strengthening program with dynamic and compliant surface balance control.    PT Home Exercise Plan SLS, squats, wall squats, SLR    Consulted and Agree with Plan of Care Patient             Patient will benefit from skilled therapeutic intervention in order to improve the following deficits and impairments:  Difficulty walking, Decreased range of motion, Increased muscle spasms, Pain, Decreased balance, Decreased strength  Visit Diagnosis: Chronic pain of right  knee  Difficulty in walking, not elsewhere classified  Muscle weakness (generalized)  Cramp and spasm     Problem List Patient Active Problem List   Diagnosis Date Noted   Osteochondritis dissecans    Thyroid nodule 07/17/2019   Eczema 07/19/2017   Allergic rhinitis 07/19/2017    Sharmon LeydenJennifer R Bryler Dibble, PT, MPT 01/05/2022,  8:40 AM  [facility] Physical Therapy [address] [place], [place], [address] Phone: [phone]   Fax:  [phone]  Name: Shanicqua Coldren MRN: [id number] Date of Birth: May 02, 1991

## 2022-01-07 ENCOUNTER — Ambulatory Visit: Payer: No Typology Code available for payment source | Admitting: Physical Therapy

## 2022-01-07 ENCOUNTER — Other Ambulatory Visit: Payer: Self-pay

## 2022-01-07 ENCOUNTER — Encounter: Payer: Self-pay | Admitting: Physical Therapy

## 2022-01-07 DIAGNOSIS — R252 Cramp and spasm: Secondary | ICD-10-CM | POA: Diagnosis not present

## 2022-01-07 DIAGNOSIS — M25561 Pain in right knee: Secondary | ICD-10-CM

## 2022-01-07 DIAGNOSIS — R262 Difficulty in walking, not elsewhere classified: Secondary | ICD-10-CM | POA: Diagnosis not present

## 2022-01-07 DIAGNOSIS — G8929 Other chronic pain: Secondary | ICD-10-CM

## 2022-01-07 DIAGNOSIS — M6281 Muscle weakness (generalized): Secondary | ICD-10-CM

## 2022-01-07 NOTE — Therapy (Signed)
[facility] Physical Therapy [address] [place], [place], [address] Phone: [phone]   Fax:  [phone]  Physical Therapy Treatment  Patient Details  Name: Angelica Norman MRN: [id number] Date of Birth: 1991-07-19 Referring Provider (PT): Danton Sewer MD   Encounter Date: 01/07/2022   PT End of Session - 01/07/22 0822     Visit Number 15    Date for PT Re-Evaluation 01/09/22    Authorization Type [facility] [employer]    Authorization Time Period PN sent on 12/22/2021 at 10th visit    PT Start Time 0800    PT Stop Time 0843    PT Time Calculation (min) 43 min    Activity Tolerance Patient tolerated treatment well    Behavior During Therapy [facility] for tasks assessed/performed             Past Medical History:  Diagnosis Date   Abnormal Pap smear of cervix 07/2015   Neg/Pos HR HPV--in [place]--no colpo/no treatment   Allergy    Eczema    Family history of adverse reaction to anesthesia    "it's hard for my dad to stay asleep during surgery"   Thyroid nodule 07/2018    Past Surgical History:  Procedure Laterality Date   HARVEST BONE GRAFT Right 10/16/2021   Procedure: HARVEST ILIAC BONE GRAFT;  Surgeon: Cammy Copa, MD;  Location: [facility] OR;  Service: Orthopedics;  Laterality: Right;   OSTEOCHONDRAL DEFECT REPAIR/RECONSTRUCTION Right 10/16/2021   Procedure: RIGHT KNEE OPEN OSTEOCHONDRAL DEFECT ALLOGRAFT;  Surgeon: Cammy Copa, MD;  Location: [facility] OR;  Service: Orthopedics;  Laterality: Right;   TYMPANOSTOMY TUBE PLACEMENT Bilateral     There were no vitals filed for this visit.   Subjective Assessment - 01/07/22 0824     Subjective Pt arriving today still reporting pocket of edema on medial right knee    Pertinent History unremarkable    Patient Stated Goals To get back to exercise and get back to work.    Currently in Pain? No/denies                               [facility] Adult PT  Treatment/Exercise - 01/07/22 0001       Blood Flow Restriction-Positions    Blood Flow Restriction Position Standing      BFR Standing   Standing Limb Occulsion Pressure (mmHg) 220    Standing Exercise Pressure (mmHg) 176    Standing Exercise Prescription Comment cuff size 4      Knee/Hip Exercises: Aerobic   Recumbent Bike L4 x 5 minutes      Knee/Hip Exercises: Machines for Strengthening   Cybex Leg Press BFR 176 mmHg Rt leg 81 lbs x 30, 3 x 15      Knee/Hip Exercises: Standing   Forward Step Up Limitations 8 inch step up with opposite hip raise using BFR bilateral LE's x30, 3x 15    Other Standing Knee Exercises walking lunges with BFR on bilateral LE's, x 30, 3x15    Other Standing Knee Exercises single limb dead lift using 12# kettle bell with BFR on bilateral LE's x 30, 3x15, inclined squats using BFR                       PT Short Term Goals - 12/22/21 9528       PT SHORT TERM GOAL #1   Title be independent  in initial HEP    Status Achieved      PT SHORT TERM GOAL #2   Title report a 30% reduction in frequency and intensity R knee pain with daily activity.    Status Achieved               PT Long Term Goals - 01/05/22 5400       PT LONG TERM GOAL #1   Title be independent in advanced HEP    Status On-going      PT LONG TERM GOAL #2   Title report a 70% reduction in frequency and intensity of R knee pain with daily tasks and after weight training    Status Achieved      PT LONG TERM GOAL #3   Title Increase R knee ROM to at least 0 to 125 to allow her to perform work activities.    Status On-going      PT LONG TERM GOAL #4   Title improve FOTO to > or = to 74 to improve funtion    Status On-going      PT LONG TERM GOAL #5   Title Increase R knee strength to 5/5 to allow pt to return to ability to perform leisure and work activities.    Status On-going                   Plan - 01/07/22 0825      Clinical Impression Statement Pt continuting to make progress with strengthening in right LE with BFR exercises. Pt returns to work to tomorrow with light duties.  Continuing progression toward maximizing function.    Examination-Activity Limitations Locomotion Level;Squat;Stairs;Stand    Examination-Participation Restrictions Occupation;Community Activity    Stability/Clinical Decision Making Stable/Uncomplicated    Rehab Potential Excellent    PT Frequency 1x / week    PT Duration 8 weeks    PT Treatment/Interventions ADLs/Self Care Home Management;Aquatic Therapy;Cryotherapy;Electrical Stimulation;Iontophoresis 4mg /ml Dexamethasone;Moist Heat;Gait training;Stair training;Functional mobility training;Therapeutic activities;Therapeutic exercise;Balance training;Neuromuscular re-education;Patient/family education;Manual techniques;Scar mobilization;Passive range of motion;Dry needling;Taping;Vasopneumatic Device;Joint Manipulations    PT Next Visit Plan Continued strengthening program with dynamic and compliant surface balance control.    PT Home Exercise Plan lunges, squats, SLS activities    Consulted and Agree with Plan of Care Patient             Patient will benefit from skilled therapeutic intervention in order to improve the following deficits and impairments:  Difficulty walking, Decreased range of motion, Increased muscle spasms, Pain, Decreased balance, Decreased strength  Visit Diagnosis: Chronic pain of right knee  Difficulty in walking, not elsewhere classified  Muscle weakness (generalized)  Cramp and spasm     Problem List Patient Active Problem List   Diagnosis Date Noted   Osteochondritis dissecans    Thyroid nodule 07/17/2019   Eczema 07/19/2017   Allergic rhinitis 07/19/2017    07/21/2017, PT, MPT 01/07/2022, 8:34 AM  [facility] Physical Therapy [address] [place], [place], [place] Phone: [phone]   Fax:   [phone]  Name: Angelica Norman MRN: [name] Date of Birth: [birth year]

## 2022-01-12 ENCOUNTER — Other Ambulatory Visit: Payer: Self-pay

## 2022-01-12 ENCOUNTER — Encounter: Payer: Self-pay | Admitting: Physical Therapy

## 2022-01-12 ENCOUNTER — Ambulatory Visit: Payer: No Typology Code available for payment source | Admitting: Physical Therapy

## 2022-01-12 DIAGNOSIS — M25561 Pain in right knee: Secondary | ICD-10-CM

## 2022-01-12 DIAGNOSIS — M6281 Muscle weakness (generalized): Secondary | ICD-10-CM | POA: Diagnosis not present

## 2022-01-12 DIAGNOSIS — R252 Cramp and spasm: Secondary | ICD-10-CM | POA: Diagnosis not present

## 2022-01-12 DIAGNOSIS — R262 Difficulty in walking, not elsewhere classified: Secondary | ICD-10-CM | POA: Diagnosis not present

## 2022-01-12 DIAGNOSIS — G8929 Other chronic pain: Secondary | ICD-10-CM

## 2022-01-12 NOTE — Therapy (Addendum)
[facility] Physical Therapy [address] [place], [place], [address] Phone: [phone]   Fax:  [phone]  Physical Therapy Treatment Recertification  Patient Details  Name: Angelica Norman MRN: [id number] Date of Birth: 1991/07/21 Referring Provider (PT): Danton Sewer MD   Encounter Date: 01/12/2022   PT End of Session - 01/12/22 0814     Visit Number 16    Date for PT Re-Evaluation 01/09/22    Authorization Type [facility] [employer]    Authorization Time Period PN sent on 12/22/2021 at 10th visit    PT Start Time 0800    PT Stop Time 0842    PT Time Calculation (min) 42 min    Activity Tolerance Patient tolerated treatment well    Behavior During Therapy [facility] for tasks assessed/performed             Past Medical History:  Diagnosis Date   Abnormal Pap smear of cervix 07/2015   Neg/Pos HR HPV--in [place]--no colpo/no treatment   Allergy    Eczema    Family history of adverse reaction to anesthesia    "it's hard for my dad to stay asleep during surgery"   Thyroid nodule 07/2018    Past Surgical History:  Procedure Laterality Date   HARVEST BONE GRAFT Right 10/16/2021   Procedure: HARVEST ILIAC BONE GRAFT;  Surgeon: Cammy Copa, MD;  Location: [facility] OR;  Service: Orthopedics;  Laterality: Right;   OSTEOCHONDRAL DEFECT REPAIR/RECONSTRUCTION Right 10/16/2021   Procedure: RIGHT KNEE OPEN OSTEOCHONDRAL DEFECT ALLOGRAFT;  Surgeon: Cammy Copa, MD;  Location: [facility] OR;  Service: Orthopedics;  Laterality: Right;   TYMPANOSTOMY TUBE PLACEMENT Bilateral     There were no vitals filed for this visit.   Subjective Assessment - 01/12/22 0815     Subjective Pt arriving today still reporting mild swelling in lateral right knee which moves media with knee flexion activities.    Currently in Pain? No/denies                [facility] PT Assessment - 01/12/22 0001       Assessment   Medical Diagnosis M25.561, G89.29     Referring Provider (PT) Danton Sewer MD    Onset Date/Surgical Date 10/16/21      AROM   Right Knee Extension 0    Right Knee Flexion 130                           [facility] Adult PT Treatment/Exercise - 01/12/22 0001       BFR Standing   Standing Limb Occulsion Pressure (mmHg) 220    Standing Exercise Pressure (mmHg) 176    Standing Exercise Prescription Comment cuff size 4      Knee/Hip Exercises: Aerobic   Recumbent Bike L4 x 5 minutes      Knee/Hip Exercises: Machines for Strengthening   Cybex Leg Press BFR 176 mmHg Rt leg 82 lbs x 30, 3 x 15      Knee/Hip Exercises: Standing   Forward Step Up Limitations BFR 5 flights up and down,, 3 flights x 3 with 30 second rest break with bilateral cuffs    Other Standing Knee Exercises walking lunges with BFR on bilateral LE's, x 30, 3x15    Other Standing Knee Exercises single leg dead lift with BFR x 30, 3x15, on Airex mat, wall squats with BFR 175 mmHg holiding max holds x 4 (1:04 seconds, 1:06 seconds, 1:12  seconds, 1:32 seconds)with bilateral BFR  cuffs   60 degrees flexion with wall squats                      PT Short Term Goals - 01/12/22 0820       PT SHORT TERM GOAL #1   Title be independent in initial HEP    Status Achieved      PT SHORT TERM GOAL #2   Title report a 30% reduction in frequency and intensity R knee pain with daily activity.    Status Achieved               PT Long Term Goals - 01/12/22 0820       PT LONG TERM GOAL #1   Title be independent in advanced HEP    Status On-going      PT LONG TERM GOAL #2   Title report a 70% reduction in frequency and intensity of R knee pain with daily tasks and after weight training    Status Achieved      PT LONG TERM GOAL #3   Title Increase R knee ROM to at least 0 to 125 to allow her to perform work activities.    Baseline currently on light duty    Status On-going      PT LONG TERM GOAL #4    Title improve FOTO to > or = to 74 to improve funtion    Status On-going      PT LONG TERM GOAL #5   Title Increase R knee strength to 5/5 to allow pt to return to ability to perform leisure and work activities.    Status On-going                   Plan - 01/12/22 5916     Clinical Impression Statement Pt conitnuing to make progress with BFR exercises for right quad strengthening. Pt reporting mild fatigue after returning to light duty for full shift at work. Continue skilled PT to maximize pt's function and progress toward LTG's. set.    Examination-Activity Limitations Locomotion Level;Squat;Stairs;Stand    Examination-Participation Restrictions Occupation;Community Activity    Stability/Clinical Decision Making Stable/Uncomplicated    Rehab Potential Excellent    PT Frequency 1x / week    PT Duration 8 weeks    PT Treatment/Interventions ADLs/Self Care Home Management;Aquatic Therapy;Cryotherapy;Electrical Stimulation;Iontophoresis 4mg /ml Dexamethasone;Moist Heat;Gait training;Stair training;Functional mobility training;Therapeutic activities;Therapeutic exercise;Balance training;Neuromuscular re-education;Patient/family education;Manual techniques;Scar mobilization;Passive range of motion;Dry needling;Taping;Vasopneumatic Device;Joint Manipulations    PT Next Visit Plan Continued strengthening program with dynamic and compliant surface balance control.    PT Home Exercise Plan congtinue with lunges, squats, SLS activities, step ups/downs    Consulted and Agree with Plan of Care Patient             Patient will benefit from skilled therapeutic intervention in order to improve the following deficits and impairments:  Difficulty walking, Decreased range of motion, Increased muscle spasms, Pain, Decreased balance, Decreased strength  Visit Diagnosis: Chronic pain of right knee  Difficulty in walking, not elsewhere classified  Muscle weakness (generalized)  Cramp and  spasm     Problem List Patient Active Problem List   Diagnosis Date Noted   Osteochondritis dissecans    Thyroid nodule 07/17/2019   Eczema 07/19/2017   Allergic rhinitis 07/19/2017    07/21/2017, PT, MPT 01/12/2022, 8:45 AM  [facility] Physical Therapy [address] [place], [place], [place] Phone: [phone]  Fax:  [phone]  Name: Burnett Lieber MRN: [id number] Date of Birth: [birth year]

## 2022-01-14 ENCOUNTER — Encounter: Payer: No Typology Code available for payment source | Admitting: Physical Therapy

## 2022-01-15 ENCOUNTER — Other Ambulatory Visit: Payer: Self-pay

## 2022-01-15 ENCOUNTER — Encounter: Payer: Self-pay | Admitting: Orthopedic Surgery

## 2022-01-15 ENCOUNTER — Ambulatory Visit: Payer: Self-pay

## 2022-01-15 ENCOUNTER — Other Ambulatory Visit: Payer: Self-pay | Admitting: Family Medicine

## 2022-01-15 DIAGNOSIS — M25561 Pain in right knee: Secondary | ICD-10-CM

## 2022-01-15 IMAGING — DX DG KNEE COMPLETE 4+V*R*
4 series · 4 of 4 positions shown · non-contrast
Comparison: [DATE].
COMPARISON: [DATE].

Addendum:
CLINICAL DATA: Right knee pain after fall today.

EXAM:
RIGHT KNEE - COMPLETE 4+ VIEW

[knee ap]
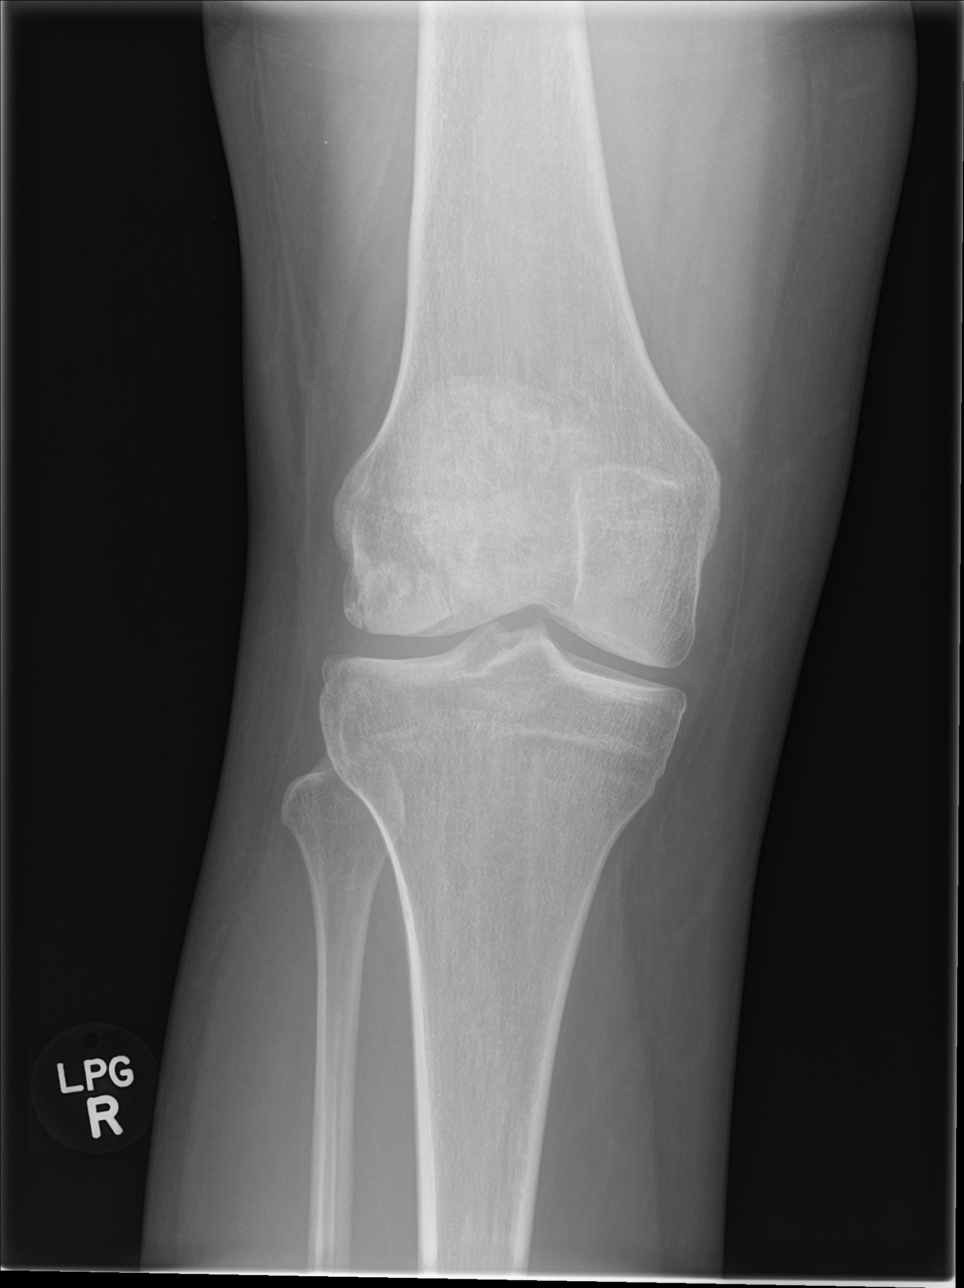

[knee obl (1 of 2)]
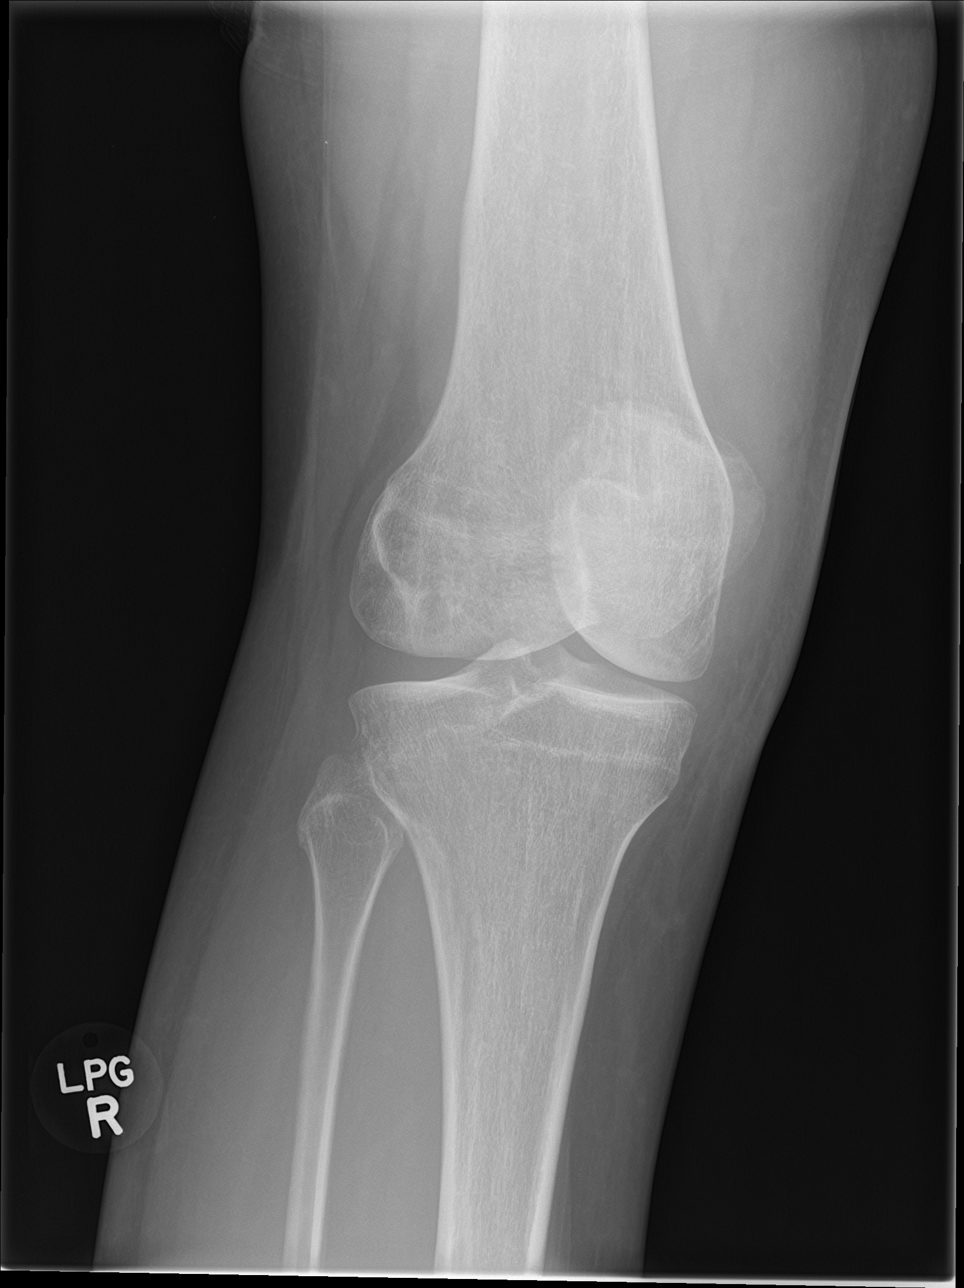

[knee obl (2 of 2)]
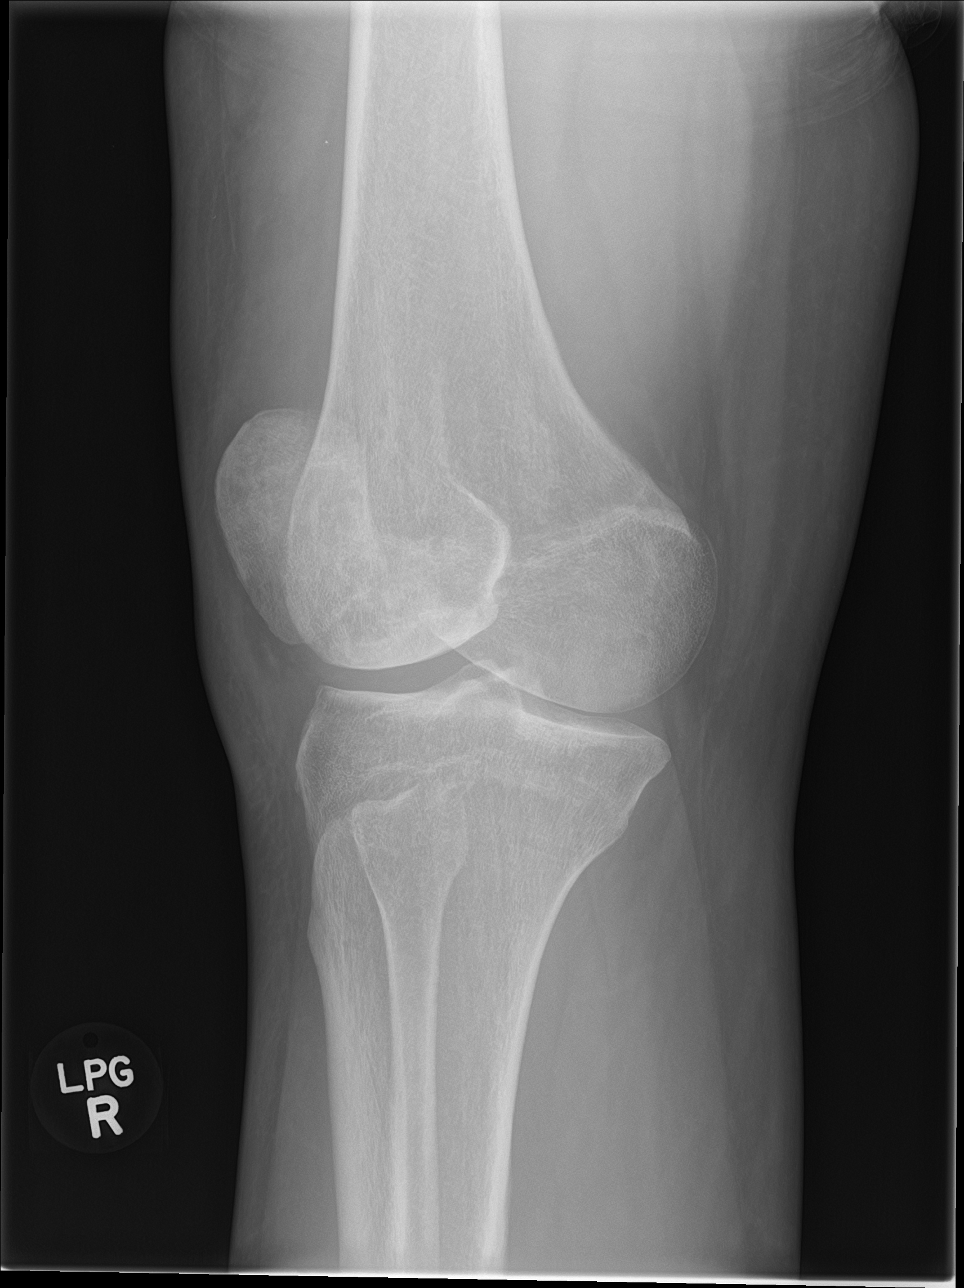

[knee lat]
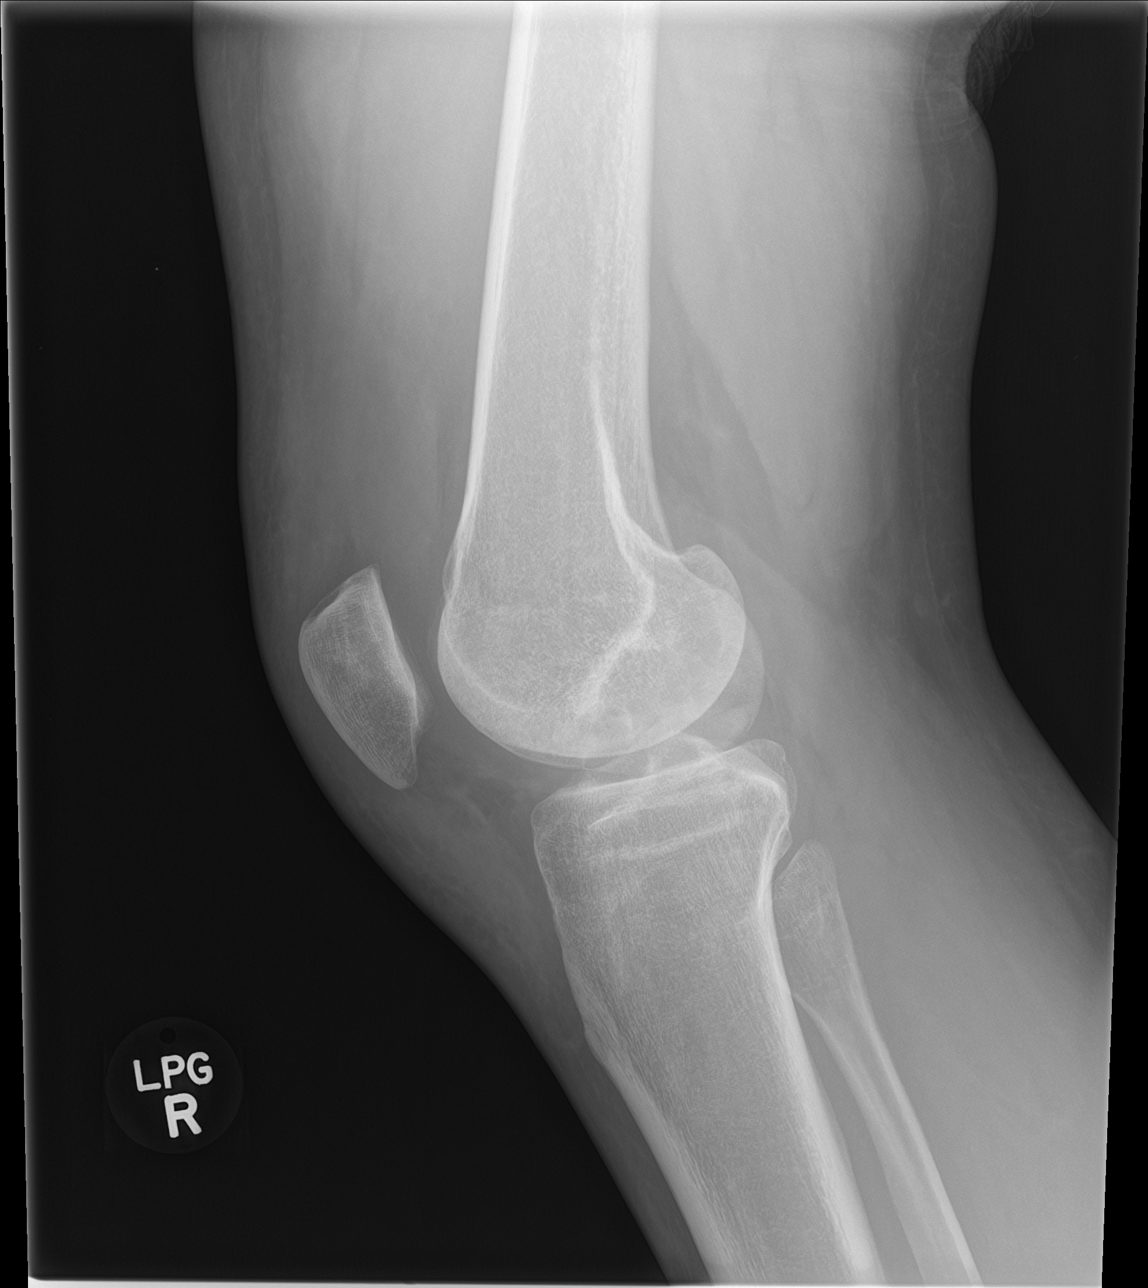

[4 of 4 positions shown; findings below may reference images not displayed]

FINDINGS: Mild suprapatellar joint effusion is noted. Findings are seen
involving the lateral femoral condyle consistent with recent
surgical repair and bone graft of osteochondral defect or
osteochondritis dissecans. No other bony abnormality is noted. Joint
spaces are intact.
IMPRESSION: Linear lucency is seen involving the lateral femoral condyle which
most likely is related to surgical pair and bone graft of
osteochondral defect or osteochondritis dissecans several months
ago. Mild suprapatellar joint effusion is noted. No other definite
bony abnormality is noted.

ADDENDUM:
In the Impression, it should say "surgical repair", not "surgical
pair".

*** End of Addendum ***
FINDINGS: Mild suprapatellar joint effusion is noted. Findings are seen
involving the lateral femoral condyle consistent with recent
surgical repair and bone graft of osteochondral defect or
osteochondritis dissecans. No other bony abnormality is noted. Joint
spaces are intact.
IMPRESSION: Linear lucency is seen involving the lateral femoral condyle which
most likely is related to surgical pair and bone graft of
osteochondral defect or osteochondritis dissecans several months
ago. Mild suprapatellar joint effusion is noted. No other definite
bony abnormality is noted.

## 2022-01-15 NOTE — Telephone Encounter (Signed)
I would say come in for xrays

## 2022-01-19 ENCOUNTER — Encounter: Payer: Self-pay | Admitting: Physical Therapy

## 2022-01-19 ENCOUNTER — Other Ambulatory Visit: Payer: Self-pay

## 2022-01-19 ENCOUNTER — Ambulatory Visit: Payer: No Typology Code available for payment source | Admitting: Physical Therapy

## 2022-01-19 DIAGNOSIS — R252 Cramp and spasm: Secondary | ICD-10-CM | POA: Diagnosis not present

## 2022-01-19 DIAGNOSIS — R262 Difficulty in walking, not elsewhere classified: Secondary | ICD-10-CM | POA: Diagnosis not present

## 2022-01-19 DIAGNOSIS — M6281 Muscle weakness (generalized): Secondary | ICD-10-CM | POA: Diagnosis not present

## 2022-01-19 DIAGNOSIS — M25561 Pain in right knee: Secondary | ICD-10-CM

## 2022-01-19 DIAGNOSIS — G8929 Other chronic pain: Secondary | ICD-10-CM

## 2022-01-19 NOTE — Therapy (Signed)
[facility] Physical Therapy [address] [place], [place], [address] Phone: [phone]   Fax:  [phone]  Physical Therapy Treatment  Patient Details  Name: Angelica Norman MRN: [id number] Date of Birth: [birth year] Referring Provider (PT): Danton Sewer MD   Encounter Date: 01/19/2022   PT End of Session - 01/19/22 0813     Visit Number 17    Date for PT Re-Evaluation 01/09/22    Authorization Type [facility]    Authorization Time Period PN sent on 12/22/2021 at 10th visit    PT Start Time 0802    PT Stop Time 0848    PT Time Calculation (min) 46 min    Activity Tolerance Patient tolerated treatment well    Behavior During Therapy [facility] for tasks assessed/performed             Past Medical History:  Diagnosis Date   Abnormal Pap smear of cervix 07/2015   Neg/Pos HR HPV--in [place]--no colpo/no treatment   Allergy    Eczema    Family history of adverse reaction to anesthesia    "it's hard for my dad to stay asleep during surgery"   Thyroid nodule 07/2018    Past Surgical History:  Procedure Laterality Date   HARVEST BONE GRAFT Right 10/16/2021   Procedure: HARVEST ILIAC BONE GRAFT;  Surgeon: Cammy Copa, MD;  Location: [facility] OR;  Service: Orthopedics;  Laterality: Right;   OSTEOCHONDRAL DEFECT REPAIR/RECONSTRUCTION Right 10/16/2021   Procedure: RIGHT KNEE OPEN OSTEOCHONDRAL DEFECT ALLOGRAFT;  Surgeon: Cammy Copa, MD;  Location: [facility] OR;  Service: Orthopedics;  Laterality: Right;   TYMPANOSTOMY TUBE PLACEMENT Bilateral     There were no vitals filed for this visit.   Subjective Assessment - 01/19/22 0815     Subjective Pt arriving today after reporting a fall at work last Thursday 01/15/2022 onto her right knee. Pt stating she slipped at work when walking out of a pts room and she slipped on wet floor. Pt's X-rays were clear, however pt reporting 2/10 pain along distal patella and anterior joint line.     Patient Stated Goals To get back to exercise and get back to work.    Currently in Pain? Yes    Pain Score 2     Pain Location Knee    Pain Orientation Right    Pain Descriptors / Indicators Aching    Pain Type Acute pain    Pain Onset 1 to 4 weeks ago                [facility] PT Assessment - 01/19/22 0001       Assessment   Medical Diagnosis M25.561, G89.29    Referring Provider (PT) Danton Sewer MD    Onset Date/Surgical Date 10/16/21      AROM   Right Knee Extension 0    Right Knee Flexion 128                           [facility] Adult PT Treatment/Exercise - 01/19/22 0001       BFR Standing   Standing Limb Occulsion Pressure (mmHg) 220    Standing Exercise Pressure (mmHg) 176    Standing Exercise Prescription Comment cuff size 4      Knee/Hip Exercises: Aerobic   Recumbent Bike L5 x 10 minutes with BRF at on bilateral LE's      Knee/Hip Exercises: Standing   Other Standing Knee Exercises squats  to lowest setting on the mat table using BFR on bilateral LE"s      Vasopneumatic   Number Minutes Vasopneumatic  15 minutes    Vasopnuematic Location  Knee    Vasopneumatic Pressure Medium    Vasopneumatic Temperature  34                     PT Education - 01/19/22 0837     Education Details Discussed limitations following fall and recommendations from MD.    Person(s) Educated Patient    Methods Explanation    Comprehension Verbalized understanding              PT Short Term Goals - 01/12/22 0820       PT SHORT TERM GOAL #1   Title be independent in initial HEP    Status Achieved      PT SHORT TERM GOAL #2   Title report a 30% reduction in frequency and intensity R knee pain with daily activity.    Status Achieved               PT Long Term Goals - 01/19/22 0836       PT LONG TERM GOAL #1   Title be independent in advanced HEP    Status On-going      PT LONG TERM GOAL #2   Title report a 70%  reduction in frequency and intensity of R knee pain with daily tasks and after weight training    Status Achieved      PT LONG TERM GOAL #3   Title Increase R knee ROM to at least 0 to 125 to allow her to perform work activities.    Status On-going      PT LONG TERM GOAL #4   Title improve FOTO to > or = to 74 to improve funtion    Status On-going      PT LONG TERM GOAL #5   Title Increase R knee strength to 5/5 to allow pt to return to ability to perform leisure and work activities.    Status On-going                   Plan - 01/19/22 0819     Clinical Impression Statement Pt s/p fall on 01/15/2022. Pt reporting pain today of 2/10 along anterior joint line.Dr. August Saucer recommended hold off on increased load bearing for 1 week. Pt tolerating bike, squats and SLR today with no reports of increased pain only muscle fatigue using BFR on bilateral LE's. Continue skilled PT to maximize function as pt tolerates.    Examination-Activity Limitations Locomotion Level;Squat;Stairs;Stand    Examination-Participation Restrictions Occupation;Community Activity    Stability/Clinical Decision Making Stable/Uncomplicated    Rehab Potential Excellent    PT Frequency 1x / week    PT Treatment/Interventions ADLs/Self Care Home Management;Aquatic Therapy;Cryotherapy;Electrical Stimulation;Iontophoresis 4mg /ml Dexamethasone;Moist Heat;Gait training;Stair training;Functional mobility training;Therapeutic activities;Therapeutic exercise;Balance training;Neuromuscular re-education;Patient/family education;Manual techniques;Scar mobilization;Passive range of motion;Dry needling;Taping;Vasopneumatic Device;Joint Manipulations    PT Next Visit Plan Continued strengthening program, hold off on increased resistive exerises for one week per MD recommendation    PT Home Exercise Plan congtinue with lunges, squats, SLS activities, step ups/downs    Consulted and Agree with Plan of Care Patient              Patient will benefit from skilled therapeutic intervention in order to improve the following deficits and impairments:  Difficulty walking, Decreased range of motion, Increased muscle  spasms, Pain, Decreased balance, Decreased strength  Visit Diagnosis: Chronic pain of right knee  Difficulty in walking, not elsewhere classified  Muscle weakness (generalized)  Cramp and spasm     Problem List Patient Active Problem List   Diagnosis Date Noted   Osteochondritis dissecans    Thyroid nodule 07/17/2019   Eczema 07/19/2017   Allergic rhinitis 07/19/2017    Sharmon Leyden, PT, MPT 01/19/2022, 8:47 AM  [facility] Physical Therapy [address] [place], [place], [address] Phone: [phone]   Fax:  [phone]  Name: Sehar Biswas MRN: [id number] Date of Birth: 20-Mar-1991

## 2022-01-21 ENCOUNTER — Encounter: Payer: Self-pay | Admitting: Physical Therapy

## 2022-01-21 ENCOUNTER — Ambulatory Visit: Payer: No Typology Code available for payment source | Admitting: Physical Therapy

## 2022-01-21 ENCOUNTER — Other Ambulatory Visit: Payer: Self-pay

## 2022-01-21 DIAGNOSIS — R262 Difficulty in walking, not elsewhere classified: Secondary | ICD-10-CM

## 2022-01-21 DIAGNOSIS — R252 Cramp and spasm: Secondary | ICD-10-CM | POA: Diagnosis not present

## 2022-01-21 DIAGNOSIS — M6281 Muscle weakness (generalized): Secondary | ICD-10-CM | POA: Diagnosis not present

## 2022-01-21 DIAGNOSIS — M25561 Pain in right knee: Secondary | ICD-10-CM | POA: Diagnosis not present

## 2022-01-21 DIAGNOSIS — G8929 Other chronic pain: Secondary | ICD-10-CM

## 2022-01-21 NOTE — Therapy (Signed)
[facility] Physical Therapy [address] [place], [place], [address] Phone: [phone]   Fax:  [phone]  Physical Therapy Treatment  Patient Details  Name: Angelica Norman MRN: [id number] Date of Birth: [birth year] Referring Provider (PT): Danton Sewer MD   Encounter Date: 01/21/2022   PT End of Session - 01/21/22 1514     Visit Number 18    Date for PT Re-Evaluation 01/09/22    Authorization Type [facility] [employer]    Authorization Time Period PN sent on 12/22/2021 at 10th visit    PT Start Time 1510    PT Stop Time 1551    PT Time Calculation (min) 41 min    Activity Tolerance Patient tolerated treatment well    Behavior During Therapy [facility] for tasks assessed/performed             Past Medical History:  Diagnosis Date   Abnormal Pap smear of cervix 07/2015   Neg/Pos HR HPV--in [place]--no colpo/no treatment   Allergy    Eczema    Family history of adverse reaction to anesthesia    "it's hard for my dad to stay asleep during surgery"   Thyroid nodule 07/2018    Past Surgical History:  Procedure Laterality Date   HARVEST BONE GRAFT Right 10/16/2021   Procedure: HARVEST ILIAC BONE GRAFT;  Surgeon: Cammy Copa, MD;  Location: [facility] OR;  Service: Orthopedics;  Laterality: Right;   OSTEOCHONDRAL DEFECT REPAIR/RECONSTRUCTION Right 10/16/2021   Procedure: RIGHT KNEE OPEN OSTEOCHONDRAL DEFECT ALLOGRAFT;  Surgeon: Cammy Copa, MD;  Location: [facility] OR;  Service: Orthopedics;  Laterality: Right;   TYMPANOSTOMY TUBE PLACEMENT Bilateral     There were no vitals filed for this visit.   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    Currently in Pain? Yes    Pain Score 3     Pain Location Knee    Pain Orientation Right    Pain Descriptors / Indicators Aching    Pain Type Acute pain     Pain Onset 1 to 4 weeks ago    Pain Frequency Intermittent    Aggravating Factors  prolonged sitting    Pain Relieving Factors rest                               [facility] Adult PT Treatment/Exercise - 01/21/22 1514       BFR Standing   Standing Limb Occulsion Pressure (mmHg) 220    Standing Exercise Pressure (mmHg) 176    Standing Exercise Prescription 30,15,15,15, reps w/ 30-60 sec rest    Standing Exercise Prescription Comment cuff size 4      Knee/Hip Exercises: Machines for Strengthening   Cybex Leg Press 150# with BFR bil, then RLE only 81# with BFR x 30, 3x15      Knee/Hip Exercises: Standing   SLS with Vectors with 10# hip flexion/ext with BFR; performed bil    Other Standing Knee Exercises squats to lowest setting on the mat table using BFR on bilateral LE"s    Other Standing Knee Exercises RDL 30# KB with BFR                       PT Short Term Goals - 01/12/22 4944  PT SHORT TERM GOAL #1   Title be independent in initial HEP    Status Achieved      PT SHORT TERM GOAL #2   Title report a 30% reduction in frequency and intensity R knee pain with daily activity.    Status Achieved               PT Long Term Goals - 01/19/22 0836       PT LONG TERM GOAL #1   Title be independent in advanced HEP    Status On-going      PT LONG TERM GOAL #2   Title report a 70% reduction in frequency and intensity of R knee pain with daily tasks and after weight training    Status Achieved      PT LONG TERM GOAL #3   Title Increase R knee ROM to at least 0 to 125 to allow her to perform work activities.    Status On-going      PT LONG TERM GOAL #4   Title improve FOTO to > or = to 74 to improve funtion    Status On-going      PT LONG TERM GOAL #5   Title Increase R knee strength to 5/5 to allow pt to return to ability to perform leisure and work activities.    Status On-going                   Plan -  01/21/22 1552     Clinical Impression Statement Able to progress exercises today without increase in pain and pt tolerated it well. Overall will continue to benefit from PT to maximize function.    Examination-Activity Limitations Locomotion Level;Squat;Stairs;Stand    Examination-Participation Restrictions Occupation;Community Activity    Stability/Clinical Decision Making Stable/Uncomplicated    Rehab Potential Excellent    PT Frequency 1x / week    PT Treatment/Interventions ADLs/Self Care Home Management;Aquatic Therapy;Cryotherapy;Electrical Stimulation;Iontophoresis 4mg /ml Dexamethasone;Moist Heat;Gait training;Stair training;Functional mobility training;Therapeutic activities;Therapeutic exercise;Balance training;Neuromuscular re-education;Patient/family education;Manual techniques;Scar mobilization;Passive range of motion;Dry needling;Taping;Vasopneumatic Device;Joint Manipulations    PT Next Visit Plan Continued strengthening program, gradual increase in exercises as pt tolerates    PT Home Exercise Plan congtinue with lunges, squats, SLS activities, step ups/downs    Consulted and Agree with Plan of Care Patient             Patient will benefit from skilled therapeutic intervention in order to improve the following deficits and impairments:  Difficulty walking, Decreased range of motion, Increased muscle spasms, Pain, Decreased balance, Decreased strength  Visit Diagnosis: Chronic pain of right knee  Difficulty in walking, not elsewhere classified  Muscle weakness (generalized)  Cramp and spasm     Problem List Patient Active Problem List   Diagnosis Date Noted   Osteochondritis dissecans    Thyroid nodule 07/17/2019   Eczema 07/19/2017   Allergic rhinitis 07/19/2017      07/21/2017, PT, DPT 01/21/22 3:53 PM   [facility] Physical Therapy [address] [place], [place], [place] Phone: [phone]   Fax:  [phone]  Name:  Angelica Norman MRN: [name] Date of Birth: [birth year]

## 2022-01-26 ENCOUNTER — Encounter: Payer: No Typology Code available for payment source | Admitting: Physical Therapy

## 2022-01-28 ENCOUNTER — Ambulatory Visit: Payer: No Typology Code available for payment source | Admitting: Physical Therapy

## 2022-01-28 ENCOUNTER — Other Ambulatory Visit: Payer: Self-pay

## 2022-01-28 ENCOUNTER — Encounter: Payer: Self-pay | Admitting: Physical Therapy

## 2022-01-28 DIAGNOSIS — M25561 Pain in right knee: Secondary | ICD-10-CM

## 2022-01-28 DIAGNOSIS — R262 Difficulty in walking, not elsewhere classified: Secondary | ICD-10-CM | POA: Diagnosis not present

## 2022-01-28 DIAGNOSIS — G8929 Other chronic pain: Secondary | ICD-10-CM | POA: Diagnosis not present

## 2022-01-28 DIAGNOSIS — M6281 Muscle weakness (generalized): Secondary | ICD-10-CM

## 2022-01-28 NOTE — Therapy (Signed)
[facility] Physical Therapy [address] [place], [place], [address] Phone: [phone]   Fax:  [phone]  Physical Therapy Treatment  Patient Details  Name: Angelica Norman MRN: [id number] Date of Birth: 04/23/1991 Referring Provider (PT): Danton Sewer MD   Encounter Date: 01/28/2022   PT End of Session - 01/28/22 1510     Visit Number 19    Date for PT Re-Evaluation 01/09/22    Authorization Type [facility] [employer]    Authorization Time Period PN sent on 12/22/2021 at 10th visit    PT Start Time 1510    PT Stop Time 1553    PT Time Calculation (min) 43 min    Activity Tolerance Patient tolerated treatment well    Behavior During Therapy [facility] for tasks assessed/performed             Past Medical History:  Diagnosis Date   Abnormal Pap smear of cervix 07/2015   Neg/Pos HR HPV--in [place]--no colpo/no treatment   Allergy    Eczema    Family history of adverse reaction to anesthesia    "it's hard for my dad to stay asleep during surgery"   Thyroid nodule 07/2018    Past Surgical History:  Procedure Laterality Date   HARVEST BONE GRAFT Right 10/16/2021   Procedure: HARVEST ILIAC BONE GRAFT;  Surgeon: Cammy Copa, MD;  Location: [facility] OR;  Service: Orthopedics;  Laterality: Right;   OSTEOCHONDRAL DEFECT REPAIR/RECONSTRUCTION Right 10/16/2021   Procedure: RIGHT KNEE OPEN OSTEOCHONDRAL DEFECT ALLOGRAFT;  Surgeon: Cammy Copa, MD;  Location: [facility] OR;  Service: Orthopedics;  Laterality: Right;   TYMPANOSTOMY TUBE PLACEMENT Bilateral     There were no vitals filed for this visit.   Subjective Assessment - 01/28/22 1554     Subjective knee feels unstable with eccentric quad control    Patient Stated Goals To get back to exercise and get back to work.    Currently in Pain? No/denies                               [facility] Adult PT Treatment/Exercise - 01/28/22 1514       BFR Standing   Standing  Limb Occulsion Pressure (mmHg) 220    Standing Exercise Pressure (mmHg) 176    Standing Exercise Prescription 30,15,15,15, reps w/ 30-60 sec rest    Standing Exercise Prescription Comment cuff size 4      Knee/Hip Exercises: Machines for Strengthening   Cybex Leg Press 150# with BFR bil, then RLE only 81# with BFR x 30, 3x15      Knee/Hip Exercises: Standing   Step Down Limitations LLE heel taps off 4" step x 20; 2x10; min cues to decr hip IR    SLS with BFR; alt 12# KB hand passes; 3x1 min each    Other Standing Knee Exercises RDL 150# with BFR                       PT Short Term Goals - 01/12/22 0820       PT SHORT TERM GOAL #1   Title be independent in initial HEP    Status Achieved      PT SHORT TERM GOAL #2   Title report a 30% reduction in frequency and intensity R knee pain with daily activity.    Status Achieved  PT Long Term Goals - 01/19/22 0836       PT LONG TERM GOAL #1   Title be independent in advanced HEP    Status On-going      PT LONG TERM GOAL #2   Title report a 70% reduction in frequency and intensity of R knee pain with daily tasks and after weight training    Status Achieved      PT LONG TERM GOAL #3   Title Increase R knee ROM to at least 0 to 125 to allow her to perform work activities.    Status On-going      PT LONG TERM GOAL #4   Title improve FOTO to > or = to 74 to improve funtion    Status On-going      PT LONG TERM GOAL #5   Title Increase R knee strength to 5/5 to allow pt to return to ability to perform leisure and work activities.    Status On-going                   Plan - 01/28/22 1554     Clinical Impression Statement Pt tolerated session well today with continued use of BFR and strengthening.  She's hoping to be released in the next week or so, and will address activity progression then.  Will continue to benefit from PT to maximize function.    Examination-Activity Limitations  Locomotion Level;Squat;Stairs;Stand    Examination-Participation Restrictions Occupation;Community Activity    Stability/Clinical Decision Making Stable/Uncomplicated    Rehab Potential Excellent    PT Frequency 1x / week    PT Treatment/Interventions ADLs/Self Care Home Management;Aquatic Therapy;Cryotherapy;Electrical Stimulation;Iontophoresis 4mg /ml Dexamethasone;Moist Heat;Gait training;Stair training;Functional mobility training;Therapeutic activities;Therapeutic exercise;Balance training;Neuromuscular re-education;Patient/family education;Manual techniques;Scar mobilization;Passive range of motion;Dry needling;Taping;Vasopneumatic Device;Joint Manipulations    PT Next Visit Plan Continued strengthening program, gradual increase in exercises as pt tolerates    PT Home Exercise Plan congtinue with lunges, squats, SLS activities, step ups/downs    Consulted and Agree with Plan of Care Patient             Patient will benefit from skilled therapeutic intervention in order to improve the following deficits and impairments:  Difficulty walking, Decreased range of motion, Increased muscle spasms, Pain, Decreased balance, Decreased strength  Visit Diagnosis: Chronic pain of right knee  Difficulty in walking, not elsewhere classified  Muscle weakness (generalized)     Problem List Patient Active Problem List   Diagnosis Date Noted   Osteochondritis dissecans    Thyroid nodule 07/17/2019   Eczema 07/19/2017   Allergic rhinitis 07/19/2017      07/21/2017, PT, DPT 01/28/22 3:55 PM     [place] [facility] Physical Therapy [address] [place], [place], [place] Phone: [phone]   Fax:  [phone]  Name: Angelica Norman MRN: [name] Date of Birth: Nov 14, 1991

## 2022-02-02 ENCOUNTER — Other Ambulatory Visit: Payer: Self-pay

## 2022-02-02 ENCOUNTER — Encounter: Payer: Self-pay | Admitting: Physical Therapy

## 2022-02-02 ENCOUNTER — Ambulatory Visit: Payer: No Typology Code available for payment source | Admitting: Physical Therapy

## 2022-02-02 DIAGNOSIS — G8929 Other chronic pain: Secondary | ICD-10-CM

## 2022-02-02 DIAGNOSIS — M6281 Muscle weakness (generalized): Secondary | ICD-10-CM | POA: Diagnosis not present

## 2022-02-02 DIAGNOSIS — R262 Difficulty in walking, not elsewhere classified: Secondary | ICD-10-CM | POA: Diagnosis not present

## 2022-02-02 DIAGNOSIS — R252 Cramp and spasm: Secondary | ICD-10-CM | POA: Diagnosis not present

## 2022-02-02 DIAGNOSIS — M25561 Pain in right knee: Secondary | ICD-10-CM

## 2022-02-02 NOTE — Therapy (Signed)
[facility] Physical Therapy [address] [place], [place], [address] Phone: [phone]   Fax:  [phone]  Physical Therapy Treatment  Patient Details  Name: Angelica Norman MRN: [id number] Date of Birth: 11-12-1991 Referring Provider (PT): Danton Sewer MD   Encounter Date: 02/02/2022   PT End of Session - 02/02/22 0755     Visit Number 20    Date for PT Re-Evaluation 01/09/22    Authorization Type [facility] [employer]    Authorization Time Period PN sent on 12/22/2021 at 10th visit    PT Start Time 0757    PT Stop Time 0841    PT Time Calculation (min) 44 min    Activity Tolerance Patient tolerated treatment well    Behavior During Therapy [facility] for tasks assessed/performed             Past Medical History:  Diagnosis Date   Abnormal Pap smear of cervix 07/2015   Neg/Pos HR HPV--in [place]--no colpo/no treatment   Allergy    Eczema    Family history of adverse reaction to anesthesia    "it's hard for my dad to stay asleep during surgery"   Thyroid nodule 07/2018    Past Surgical History:  Procedure Laterality Date   HARVEST BONE GRAFT Right 10/16/2021   Procedure: HARVEST ILIAC BONE GRAFT;  Surgeon: Cammy Copa, MD;  Location: [facility] OR;  Service: Orthopedics;  Laterality: Right;   OSTEOCHONDRAL DEFECT REPAIR/RECONSTRUCTION Right 10/16/2021   Procedure: RIGHT KNEE OPEN OSTEOCHONDRAL DEFECT ALLOGRAFT;  Surgeon: Cammy Copa, MD;  Location: [facility] OR;  Service: Orthopedics;  Laterality: Right;   TYMPANOSTOMY TUBE PLACEMENT Bilateral     There were no vitals filed for this visit.   Subjective Assessment - 02/02/22 0756     Subjective knee is about the same still feeling unstable with eccentric quad    Pertinent History unremarkable    Patient Stated Goals To get back to exercise and get back to work.    Currently in Pain? Yes    Pain Score 3     Pain Location Knee    Pain Orientation Right;Medial    Pain Onset More  than a month ago    Pain Frequency Intermittent    Aggravating Factors  prolonged sitting    Pain Relieving Factors rest                               [facility] Adult PT Treatment/Exercise - 02/02/22 0806       BFR Standing   Standing Limb Occulsion Pressure (mmHg) 220    Standing Exercise Pressure (mmHg) 176    Standing Exercise Prescription 30,15,15,15, reps w/ 30-60 sec rest    Standing Exercise Prescription Comment cuff size 4      Knee/Hip Exercises: Aerobic   Recumbent Bike L5 x 10 minutes with BRF at on bilateral LE's      Knee/Hip Exercises: Machines for Strengthening   Cybex Leg Press 150# with BFR bil, then RLE only 87# with BFR x 30, 3x15      Knee/Hip Exercises: Standing   Other Standing Knee Exercises SL squat 90-60 deg with TRX on Rt with BFR    Other Standing Knee Exercises RLE DL with BFR 28# KB                       PT Short Term Goals - 01/12/22 3662  PT SHORT TERM GOAL #1   Title be independent in initial HEP    Status Achieved      PT SHORT TERM GOAL #2   Title report a 30% reduction in frequency and intensity R knee pain with daily activity.    Status Achieved               PT Long Term Goals - 01/19/22 0836       PT LONG TERM GOAL #1   Title be independent in advanced HEP    Status On-going      PT LONG TERM GOAL #2   Title report a 70% reduction in frequency and intensity of R knee pain with daily tasks and after weight training    Status Achieved      PT LONG TERM GOAL #3   Title Increase R knee ROM to at least 0 to 125 to allow her to perform work activities.    Status On-going      PT LONG TERM GOAL #4   Title improve FOTO to > or = to 74 to improve funtion    Status On-going      PT LONG TERM GOAL #5   Title Increase R knee strength to 5/5 to allow pt to return to ability to perform leisure and work activities.    Status On-going                   Plan - 02/02/22 1638      Clinical Impression Statement Pt continues to have mild anterior knee pain with squats and stairs activities.  Worked within pain minimal range today to continue to progress strength.  Will continue to benefit from PT to maximize function.    Examination-Activity Limitations Locomotion Level;Squat;Stairs;Stand    Examination-Participation Restrictions Occupation;Community Activity    Stability/Clinical Decision Making Stable/Uncomplicated    Rehab Potential Excellent    PT Frequency 1x / week    PT Treatment/Interventions ADLs/Self Care Home Management;Aquatic Therapy;Cryotherapy;Electrical Stimulation;Iontophoresis 4mg /ml Dexamethasone;Moist Heat;Gait training;Stair training;Functional mobility training;Therapeutic activities;Therapeutic exercise;Balance training;Neuromuscular re-education;Patient/family education;Manual techniques;Scar mobilization;Passive range of motion;Dry needling;Taping;Vasopneumatic Device;Joint Manipulations    PT Next Visit Plan Continued strengthening program, gradual increase in exercises as pt tolerates, to MD next week    PT Home Exercise Plan congtinue with lunges, squats, SLS activities, step ups/downs    Consulted and Agree with Plan of Care Patient             Patient will benefit from skilled therapeutic intervention in order to improve the following deficits and impairments:  Difficulty walking, Decreased range of motion, Increased muscle spasms, Pain, Decreased balance, Decreased strength  Visit Diagnosis: Chronic pain of right knee  Difficulty in walking, not elsewhere classified  Muscle weakness (generalized)  Cramp and spasm     Problem List Patient Active Problem List   Diagnosis Date Noted   Osteochondritis dissecans    Thyroid nodule 07/17/2019   Eczema 07/19/2017   Allergic rhinitis 07/19/2017    07/21/2017, PT 02/02/2022, 8:39 AM  [facility] Physical Therapy [address] [place], [place],  [place] Phone: [phone]   Fax:  [phone]  Name: [name] MRN: Angelica Norman Date of Birth: Oct 13, 1991

## 2022-02-02 NOTE — Addendum Note (Signed)
Addended by: Sharmon Leyden on: 02/02/2022 04:30 PM   Modules accepted: Orders

## 2022-02-04 ENCOUNTER — Other Ambulatory Visit: Payer: Self-pay

## 2022-02-04 ENCOUNTER — Encounter: Payer: Self-pay | Admitting: Physical Therapy

## 2022-02-04 ENCOUNTER — Ambulatory Visit (INDEPENDENT_AMBULATORY_CARE_PROVIDER_SITE_OTHER): Payer: No Typology Code available for payment source | Admitting: Physical Therapy

## 2022-02-04 DIAGNOSIS — M25561 Pain in right knee: Secondary | ICD-10-CM

## 2022-02-04 DIAGNOSIS — M6281 Muscle weakness (generalized): Secondary | ICD-10-CM | POA: Diagnosis not present

## 2022-02-04 DIAGNOSIS — G8929 Other chronic pain: Secondary | ICD-10-CM | POA: Diagnosis not present

## 2022-02-04 DIAGNOSIS — R262 Difficulty in walking, not elsewhere classified: Secondary | ICD-10-CM

## 2022-02-04 NOTE — Therapy (Signed)
[place] [facility] ?[address] ?[place], [place], [address] ?Phone: [phone]   Fax:  [phone] ? ?Physical Therapy Treatment ? ?Patient Details  ?Name: Angelica Norman ?MRN: [id number] ?Date of Birth: Apr 19, 1991 ?Referring Provider (PT): Valinda Party MD ? ? ?Encounter Date: 02/04/2022 ? ? PT End of Session - 02/04/22 1511   ? ? Visit Number 21   ? Number of Visits 32   ? Date for PT Re-Evaluation 03/13/22   ? Authorization Type [facility] [employer]   ? Authorization Time Period PN sent on 12/22/2021 at 10th visit   ? PT Start Time 1510   ? PT Stop Time 2426   ? PT Time Calculation (min) 45 min   ? Activity Tolerance Patient tolerated treatment well   ? Behavior During Therapy [facility] for tasks assessed/performed   ? ?  ?  ? ?  ? ? ?Past Medical History:  ?Diagnosis Date  ? Abnormal Pap smear of cervix 07/2015  ? Neg/Pos HR HPV--in [place]--no colpo/no treatment  ? Allergy   ? Eczema   ? Family history of adverse reaction to anesthesia   ? "it's hard for my dad to stay asleep during surgery"  ? Thyroid nodule 07/2018  ? ? ?Past Surgical History:  ?Procedure Laterality Date  ? HARVEST BONE GRAFT Right 10/16/2021  ? Procedure: HARVEST ILIAC BONE GRAFT;  Surgeon: Meredith Pel, MD;  Location: [place];  Service: Orthopedics;  Laterality: Right;  ? 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 Procedure: RIGHT KNEE OPEN OSTEOCHONDRAL DEFECT ALLOGRAFT;  Surgeon: Meredith Pel, MD;  Location: [place];  Service: Orthopedics;  Laterality: Right;  ? TYMPANOSTOMY TUBE PLACEMENT Bilateral   ? ? ?There were no vitals filed for this visit. ? ? Subjective Assessment - 02/04/22 1512   ? ? Subjective still having some ant knee pain but overall doing well   ? Pertinent History unremarkable   ? Patient Stated Goals To get back to exercise and get back to work.   ? Currently in Pain? Yes   ? Pain Score 3    ? Pain Location Knee   ? Pain Orientation Right;Medial   ?  Pain Descriptors / Indicators Aching   ? Pain Type Acute pain   ? Pain Onset More than a month ago   ? Pain Frequency Intermittent   ? Aggravating Factors  prolonged sitting   ? Pain Relieving Factors rest   ? ?  ?  ? ?  ? ? ? ? ? [facility] PT Assessment - 02/04/22 1513   ? ?  ? Assessment  ? Medical Diagnosis M25.561, G89.29   ? Referring Provider (PT) Valinda Party MD   ? Onset Date/Surgical Date 10/16/21   ?  ? Observation/Other Assessments  ? Focus on Therapeutic Outcomes (FOTO)  85   ?  ? AROM  ? Right Knee Extension 5   hyperextension  ? Right Knee Flexion 135   ?  ? Strength  ? Right Knee Extension --   87.1, 98.4 = 92.75# (87% of LLE)  ? Left Knee Extension --   99.8, 116.1, 102.4 =106.1  ? ?  ?  ? ?  ? ? ? ? ? ? ? ? ? ? ? ? ? ? ? ? [place] Adult PT Treatment/Exercise - 02/04/22 1529   ? ?  ? Knee/Hip Exercises: Machines for Strengthening  ? Cybex Leg Press 150# RLE only 3x15; trial reps with 75# of jumping x 10 reps   ?  ?  Knee/Hip Exercises: Standing  ? SLS with Vectors Lt foot on slider 12:00-6:00 with LLE x 10 reps each direction   ? Other Standing Knee Exercises decline goblet squat with 25# KB 3x15; lateral lunge 3x15 to Rt (12# KB last 2 sets)   ? Other Standing Knee Exercises curtsey lunge with RLE 3x15 with 12# KB   ? ?  ?  ? ?  ? ? ? ? ? ? ? ? ? ? ? ? PT Short Term Goals - 01/12/22 0820   ? ?  ? PT SHORT TERM GOAL #1  ? Title be independent in initial HEP   ? Status Achieved   ?  ? PT SHORT TERM GOAL #2  ? Title report a 30% reduction in frequency and intensity R knee pain with daily activity.   ? Status Achieved   ? ?  ?  ? ?  ? ? ? ? PT Long Term Goals - 02/04/22 1558   ? ?  ? PT LONG TERM GOAL #1  ? Title be independent in advanced HEP   ? Status On-going   ?  ? PT LONG TERM GOAL #2  ? Title report a 70% reduction in frequency and intensity of R knee pain with daily tasks and after weight training   ? Status Achieved   ?  ? PT LONG TERM GOAL #3  ? Title Increase R knee ROM to at least 0 to 125 to  allow her to perform work activities.   ? Status Achieved   ?  ? PT LONG TERM GOAL #4  ? Title improve FOTO to > or = to 74 to improve funtion   ? Status Achieved   ?  ? PT LONG TERM GOAL #5  ? Title Increase R knee strength to 5/5 to allow pt to return to ability to perform leisure and work activities.   ? Status Achieved   ? ?  ?  ? ?  ? ? ? ? ? ? ? ? Plan - 02/04/22 1558   ? ? Clinical Impression Statement Pt has met all LTGs at this time.  She still lacks some quad strength compared to her Lt side but overall doing well with PT.  Pt to see MD and will determine next steps.   ? Examination-Activity Limitations Locomotion Level;Squat;Stairs;Stand   ? Examination-Participation Restrictions Occupation;Community Activity   ? Stability/Clinical Decision Making Stable/Uncomplicated   ? Rehab Potential Excellent   ? PT Frequency 1x / week   ? PT Treatment/Interventions ADLs/Self Care Home Management;Aquatic Therapy;Cryotherapy;Electrical Stimulation;Iontophoresis 27m/ml Dexamethasone;Moist Heat;Gait training;Stair training;Functional mobility training;Therapeutic activities;Therapeutic exercise;Balance training;Neuromuscular re-education;Patient/family education;Manual techniques;Scar mobilization;Passive range of motion;Dry needling;Taping;Vasopneumatic Device;Joint Manipulations   ? PT Next Visit Plan Continued strengthening program, see what MD says   ? PT Home Exercise Plan congtinue with lunges, squats, SLS activities, step ups/downs   ? Consulted and Agree with Plan of Care Patient   ? ?  ?  ? ?  ? ? ?Patient will benefit from skilled therapeutic intervention in order to improve the following deficits and impairments:  Difficulty walking, Decreased range of motion, Increased muscle spasms, Pain, Decreased balance, Decreased strength ? ?Visit Diagnosis: ?Chronic pain of right knee ? ?Difficulty in walking, not elsewhere classified ? ?Muscle weakness (generalized) ? ? ? ? ?Problem List ?Patient Active Problem List   ? Diagnosis Date Noted  ? Osteochondritis dissecans   ? Thyroid nodule 07/17/2019  ? Eczema 07/19/2017  ? Allergic rhinitis 07/19/2017  ? ? ? ?  Laureen Abrahams, PT, DPT ?02/04/22 4:01 PM ? ? ? ?[place] [facility] ?[address] ?[place], [place], [address] ?Phone: [phone]   Fax:  [phone] ? ?Name: Angelica Norman ?MRN: [id number] ?Date of Birth: [birth year] ? ? ? ?

## 2022-02-09 ENCOUNTER — Encounter: Payer: Self-pay | Admitting: Rehabilitative and Restorative Service Providers"

## 2022-02-09 ENCOUNTER — Ambulatory Visit (INDEPENDENT_AMBULATORY_CARE_PROVIDER_SITE_OTHER): Payer: No Typology Code available for payment source | Admitting: Surgical

## 2022-02-09 ENCOUNTER — Ambulatory Visit (INDEPENDENT_AMBULATORY_CARE_PROVIDER_SITE_OTHER): Payer: No Typology Code available for payment source

## 2022-02-09 ENCOUNTER — Ambulatory Visit: Payer: No Typology Code available for payment source | Admitting: Rehabilitative and Restorative Service Providers"

## 2022-02-09 ENCOUNTER — Other Ambulatory Visit: Payer: Self-pay

## 2022-02-09 ENCOUNTER — Encounter: Payer: Self-pay | Admitting: Orthopedic Surgery

## 2022-02-09 DIAGNOSIS — G8929 Other chronic pain: Secondary | ICD-10-CM

## 2022-02-09 DIAGNOSIS — M25561 Pain in right knee: Secondary | ICD-10-CM | POA: Diagnosis not present

## 2022-02-09 DIAGNOSIS — R262 Difficulty in walking, not elsewhere classified: Secondary | ICD-10-CM | POA: Diagnosis not present

## 2022-02-09 DIAGNOSIS — M6281 Muscle weakness (generalized): Secondary | ICD-10-CM | POA: Diagnosis not present

## 2022-02-09 NOTE — Therapy (Signed)
[facility] [facility] ?[address] ?[place], [place], [address] ?Phone: [phone]   Fax:  [phone] ? ?Physical Therapy Treatment ? ?Patient Details  ?Name: Angelica Norman ?MRN: [id number] ?Date of Birth: 1991-01-14 ?Referring Provider (PT): Danton Sewer MD ? ? ?Encounter Date: 02/09/2022 ? ? PT End of Session - 02/09/22 1419   ? ? Visit Number 22   ? Number of Visits 32   ? Date for PT Re-Evaluation 03/13/22   ? Authorization Type [facility]   ? Authorization Time Period PN sent on 12/22/2021 at 10th visit   ? PT Start Time 1340   ? PT Stop Time 1420   ? PT Time Calculation (min) 40 min   ? Activity Tolerance Patient tolerated treatment well   ? Behavior During Therapy [facility] for tasks assessed/performed   ? ?  ?  ? ?  ? ? ?Past Medical History:  ?Diagnosis Date  ? Abnormal Pap smear of cervix 07/2015  ? Neg/Pos HR HPV--in [place]--no colpo/no treatment  ? Allergy   ? Eczema   ? Family history of adverse reaction to anesthesia   ? "it's hard for my dad to stay asleep during surgery"  ? Thyroid nodule 07/2018  ? ? ?Past Surgical History:  ?Procedure Laterality Date  ? HARVEST BONE GRAFT Right 10/16/2021  ? Procedure: HARVEST ILIAC BONE GRAFT;  Surgeon: Cammy Copa, MD;  Location: [facility] OR;  Service: Orthopedics;  Laterality: Right;  ? 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 Procedure: RIGHT KNEE OPEN OSTEOCHONDRAL DEFECT ALLOGRAFT;  Surgeon: Cammy Copa, MD;  Location: [facility] OR;  Service: Orthopedics;  Laterality: Right;  ? TYMPANOSTOMY TUBE PLACEMENT Bilateral   ? ? ?There were no vitals filed for this visit. ? ? Subjective Assessment - 02/09/22 1346   ? ? Subjective Pt. indicated having some mild knee medial symptoms with loading in single leg.  Saw MD office today with indicated no restriction per Pt.  MRI to be performed in future regarding after fall.   ? Pertinent History unremarkable   ? Patient Stated Goals To get back to  exercise and get back to work.   ? Currently in Pain? No/denies   ? Pain Score 0-No pain   ? Pain Onset More than a month ago   ? ?  ?  ? ?  ? ? ? ? ? ? ? ? ? ? ? ? ? ? ? ? ? ? ? ? [facility] Adult PT Treatment/Exercise - 02/09/22 0001   ? ?  ? Neuro Re-ed   ? Neuro Re-ed Details  SLS c slider vector (anterior, anterior/lateral, posterior/lateral, posterior) x 10 bilateral, SLS c partial knee bend 10 lb kettle bell lateral swings 30 sec x 4 bilateral, lateral stepping SL loading 3 cones x 10 bilateral   ?  ? Knee/Hip Exercises: Aerobic  ? Recumbent Bike Lvl 5 5 mins   ?  ? Knee/Hip Exercises: Plyometrics  ? Bilateral Jumping Limitations off 4 inch platform with focus on load absorption   ? Other Plyometric Exercises Running in place at wall 30 sec x 3   ?  ? Knee/Hip Exercises: Standing  ? Step Down Limitations lateral step down 6 inch step x 10 on Rt (mild pain), ajdusted to 4 inch step x 15   ? Other Standing Knee Exercises split squat c foot in chair 2 x 10   ? ?  ?  ? ?  ? ? ? ? ? ? ? ? ? ? ? ?  PT Short Term Goals - 01/12/22 0820   ? ?  ? PT SHORT TERM GOAL #1  ? Title be independent in initial HEP   ? Status Achieved   ?  ? PT SHORT TERM GOAL #2  ? Title report a 30% reduction in frequency and intensity R knee pain with daily activity.   ? Status Achieved   ? ?  ?  ? ?  ? ? ? ? PT Long Term Goals - 02/04/22 1558   ? ?  ? PT LONG TERM GOAL #1  ? Title be independent in advanced HEP   ? Status On-going   ?  ? PT LONG TERM GOAL #2  ? Title report a 70% reduction in frequency and intensity of R knee pain with daily tasks and after weight training   ? Status Achieved   ?  ? PT LONG TERM GOAL #3  ? Title Increase R knee ROM to at least 0 to 125 to allow her to perform work activities.   ? Status Achieved   ?  ? PT LONG TERM GOAL #4  ? Title improve FOTO to > or = to 74 to improve funtion   ? Status Achieved   ?  ? PT LONG TERM GOAL #5  ? Title Increase R knee strength to 5/5 to allow pt to return to ability to perform  leisure and work activities.   ? Status Achieved   ? ?  ?  ? ?  ? ? ? ? ? ? ? ? Plan - 02/09/22 1414   ? ? Clinical Impression Statement Progression in intervention in clinic today focus on dynamic loading and balance control with fair to good control noted. Pt. indicated MRI to check for meniscus tear (not scheduled yet).  After dicussion with Pt., plan to perform 1x/week transitioning with ulimate goals for HEP transitioning.   ? Examination-Activity Limitations Locomotion Level;Squat;Stairs;Stand   ? Examination-Participation Restrictions Occupation;Community Activity   ? Stability/Clinical Decision Making Stable/Uncomplicated   ? Rehab Potential Excellent   ? PT Frequency 1x / week   ? PT Treatment/Interventions ADLs/Self Care Home Management;Aquatic Therapy;Cryotherapy;Electrical Stimulation;Iontophoresis 4mg /ml Dexamethasone;Moist Heat;Gait training;Stair training;Functional mobility training;Therapeutic activities;Therapeutic exercise;Balance training;Neuromuscular re-education;Patient/family education;Manual techniques;Scar mobilization;Passive range of motion;Dry needling;Taping;Vasopneumatic Device;Joint Manipulations   ? PT Next Visit Plan 1x/week, dynamic balance control, early plyometrics.   ? PT Home Exercise Plan congtinue with lunges, squats, SLS activities, step ups/downs   ? Consulted and Agree with Plan of Care Patient   ? ?  ?  ? ?  ? ? ?Patient will benefit from skilled therapeutic intervention in order to improve the following deficits and impairments:  Difficulty walking, Decreased range of motion, Increased muscle spasms, Pain, Decreased balance, Decreased strength ? ?Visit Diagnosis: ?Chronic pain of right knee ? ?Difficulty in walking, not elsewhere classified ? ?Muscle weakness (generalized) ? ? ? ? ?Problem List ?Patient Active Problem List  ? Diagnosis Date Noted  ? Osteochondritis dissecans   ? Thyroid nodule 07/17/2019  ? Eczema 07/19/2017  ? Allergic rhinitis 07/19/2017  ? ? ?07/21/2017, PT, DPT, OCS, ATC ?02/09/22  2:21 PM ? ? ? ?[place] ?[facility] ?[address] ?[place], [place], [place] ?Phone: [phone]   Fax:  [phone] ? ?Name: Angelica Norman ?MRN: [name] ?Date of Birth: September 04, 1991 ? ? ? ?

## 2022-02-11 ENCOUNTER — Encounter: Payer: No Typology Code available for payment source | Admitting: Rehabilitative and Restorative Service Providers"

## 2022-02-11 ENCOUNTER — Telehealth: Payer: Self-pay | Admitting: Orthopedic Surgery

## 2022-02-11 NOTE — Telephone Encounter (Signed)
Called pt 2x to sch post MRI after 02/16/22 with GD but mail box is full and can not accept messages ?

## 2022-02-13 ENCOUNTER — Encounter: Payer: Self-pay | Admitting: Orthopedic Surgery

## 2022-02-13 NOTE — Progress Notes (Signed)
? ?Post-Op Visit Note ?  ?Patient: Angelica Norman           ?Date of Birth: 10-Dec-1990           ?MRN: [id number] ?Visit Date: 02/09/2022 ?PCP: Martinique, Betty G, MD ? ? ?Assessment & Plan: ? ?Chief Complaint:  ?Chief Complaint  ?Patient presents with  ? Right Knee - Follow-up  ? ?Visit Diagnoses:  ?1. Chronic pain of right knee   ? ? ?Plan: Patient is a 31 year old female who presents s/p right knee osteochondral allograft on 10/16/2021.  She had an incident where she slipped and fell onto her right knee in early February.  She struck the anterior aspect of her right knee on her fall.  She did notice swelling for about 2 days after the fall but denies any bruising at the time.  She was seen and had radiographs at the time that demonstrated no significant changes.  Since that incident, she has noticed more medial sided pain that is significantly worse with going up stairs.  She also notices pain that is worst in the first 30 degrees of flexion.  This pain seems to be very slowly improving.  She denies any mechanical pain or instability.  Does not take any medications for pain.  She has had no new falls or injuries since that incident.  Does not have to use a knee brace.  She is continuing with physical therapy and has 87% strength of the contralateral leg.  She is continuing to do goblet squats, split squats, etc.  She notes about 40% improvement of the medial sided knee pain.  On exam, she does have a positive effusion which is new compared with her prior examinations.  She has great range of motion and strength of the right knee with 0 degrees extension and greater than 120 degrees of knee flexion.  5/5 hamstring and quadricep strength.  ACL is stable on Lachman exam and anterior drawer.  No instability to posterior drawer.  MCL/LCL feels stable to valgus/varus stress at 0 and 30 degrees of knee flexion.  No calf tenderness.  Negative Homans' sign. ? ?Discussed options available to patient.  Right knee  radiographs do look good today with no significant change in position of the osteochondral allograft compared with prior radiographs and compared with time of surgery.  Looks to be well incorporated into the femur.  However, it is concerning that she has continued medial sided pain with a new effusion since her fall.  With new effusion, plan for MRI of the right knee for further evaluation of medial meniscal tear.  It will also allow [place] to view the osteochondral fragment and ensure that it is consolidating well with the femur.  Follow-up after MRI to review results. ? ?Follow-Up Instructions: No follow-ups on file.  ? ?Orders:  ?Orders Placed This Encounter  ?Procedures  ? XR KNEE 3 VIEW RIGHT  ? MR Knee Right w/o contrast  ? ?No orders of the defined types were placed in this encounter. ? ? ?Imaging: ?No results found. ? ?PMFS History: ?Patient Active Problem List  ? Diagnosis Date Noted  ? Osteochondritis dissecans   ? Thyroid nodule 07/17/2019  ? Eczema 07/19/2017  ? Allergic rhinitis 07/19/2017  ? ?Past Medical History:  ?Diagnosis Date  ? Abnormal Pap smear of cervix 07/2015  ? Neg/Pos HR HPV--in [place]--no colpo/no treatment  ? Allergy   ? Eczema   ? Family history of adverse reaction to anesthesia   ? "it's hard  for my dad to stay asleep during surgery"  ? Thyroid nodule 07/2018  ?  ?Family History  ?Problem Relation Age of Onset  ? Arthritis Mother   ? Hyperlipidemia Mother   ? Diabetes Father   ? Cancer Father   ?     prostate  ? Thyroid disease Father   ?     hypothyroid  ? Heart disease Maternal Grandmother   ? Hyperlipidemia Maternal Grandmother   ? Stroke Maternal Grandmother   ? Stroke Paternal Grandmother   ? Hypertension Paternal Grandmother   ? Cancer Paternal Grandfather 77  ?     Dec colon cancer  ?  ?Past Surgical History:  ?Procedure Laterality Date  ? HARVEST BONE GRAFT Right 10/16/2021  ? Procedure: HARVEST ILIAC BONE GRAFT;  Surgeon: Meredith Pel, MD;  Location: [place];  Service:  Orthopedics;  Laterality: Right;  ? 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 Procedure: RIGHT KNEE OPEN OSTEOCHONDRAL DEFECT ALLOGRAFT;  Surgeon: Meredith Pel, MD;  Location: [place];  Service: Orthopedics;  Laterality: Right;  ? TYMPANOSTOMY TUBE PLACEMENT Bilateral   ? ?Social History  ? ?Occupational History  ? Not on file  ?Tobacco Use  ? Smoking status: Never  ? Smokeless tobacco: Never  ?Vaping Use  ? Vaping Use: Never used  ?Substance and Sexual Activity  ? Alcohol use: Yes  ?  Alcohol/week: 3.0 standard drinks  ?  Types: 3 Cans of beer per week  ? Drug use: No  ? Sexual activity: Yes  ?  Partners: Male  ?  Birth control/protection: Implant  ?  Comment: Nexplanon inserted 10-26-14/Lt.arm  ? ? ? ?

## 2022-02-16 ENCOUNTER — Encounter: Payer: Self-pay | Admitting: Physical Therapy

## 2022-02-16 ENCOUNTER — Ambulatory Visit
Admission: RE | Admit: 2022-02-16 | Discharge: 2022-02-16 | Disposition: A | Discharge status: Home / Self Care | Payer: Worker's Compensation | Source: Ambulatory Visit | Attending: Orthopedic Surgery | Admitting: Orthopedic Surgery

## 2022-02-16 ENCOUNTER — Ambulatory Visit: Payer: No Typology Code available for payment source | Admitting: Physical Therapy

## 2022-02-16 ENCOUNTER — Other Ambulatory Visit: Payer: Self-pay

## 2022-02-16 DIAGNOSIS — M25561 Pain in right knee: Secondary | ICD-10-CM | POA: Diagnosis not present

## 2022-02-16 DIAGNOSIS — G8929 Other chronic pain: Secondary | ICD-10-CM | POA: Diagnosis not present

## 2022-02-16 DIAGNOSIS — M6281 Muscle weakness (generalized): Secondary | ICD-10-CM

## 2022-02-16 DIAGNOSIS — R262 Difficulty in walking, not elsewhere classified: Secondary | ICD-10-CM

## 2022-02-16 IMAGING — MR MR KNEE*R* W/O CM
4 of 7 series · 21 of 40 positions shown · non-contrast
Comparison: MRI [DATE]; x-ray [DATE].

CLINICAL DATA: Right medial knee pain, status post injury due to
fall directly onto knee 1 month ago.

EXAM:
MRI OF THE RIGHT KNEE WITHOUT CONTRAST
TECHNIQUE: Multiplanar, multisequence MR imaging of the knee was performed. No
intravenous contrast was administered.

[Series 3: T2 fat-sat · axial · 4.0mm · 0.50mm/px · z∈[-42,+73]mm · 4 of 24 slices shown]
[im 1/24]
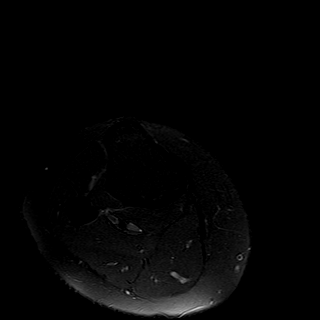
[im 5/24]
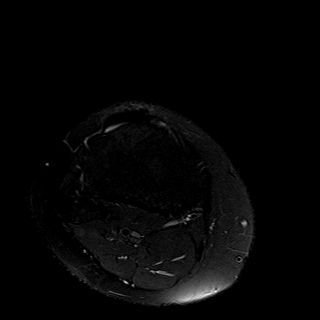
[im 14/24]
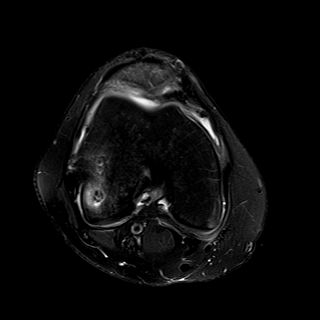
[im 24/24]
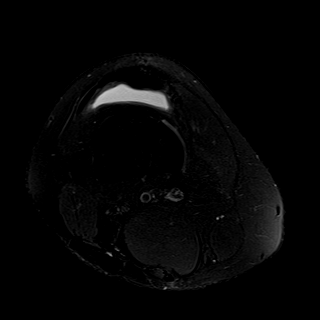

[Series 7: PD fat-sat · sagittal · 3.0mm · 0.29mm/px · 7 of 27 slices shown (1 of 3)]
[im 1/27]
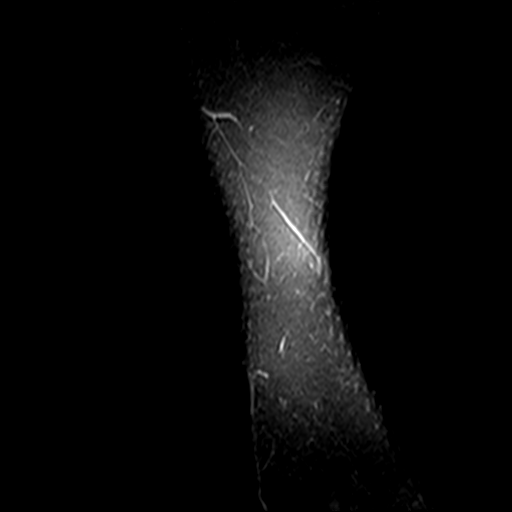
[im 5/27]
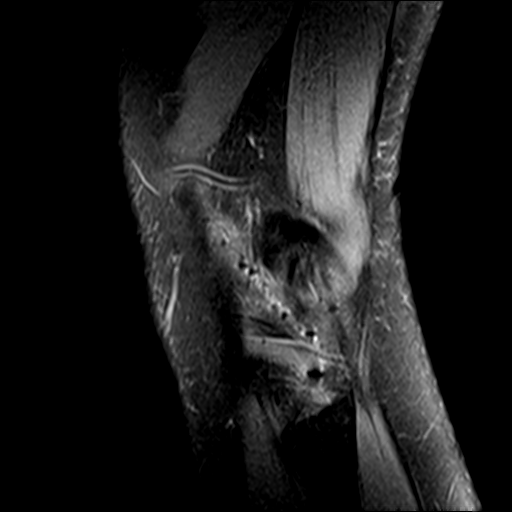
[im 9/27]
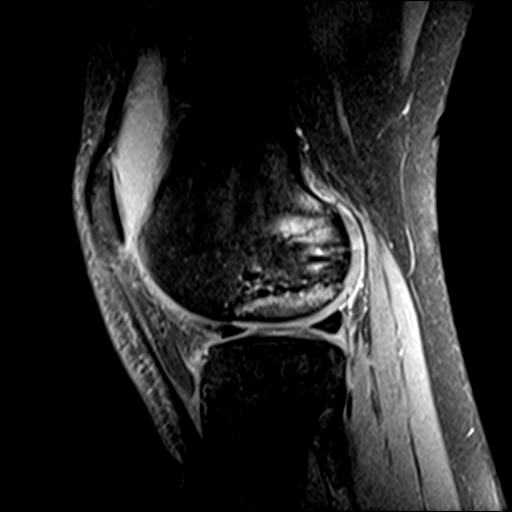
[im 14/27]
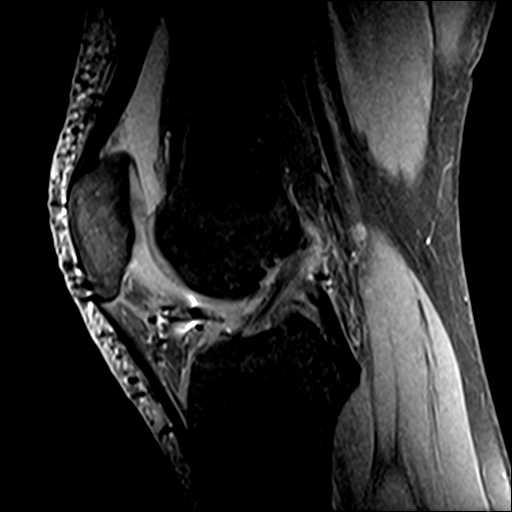
[im 18/27]
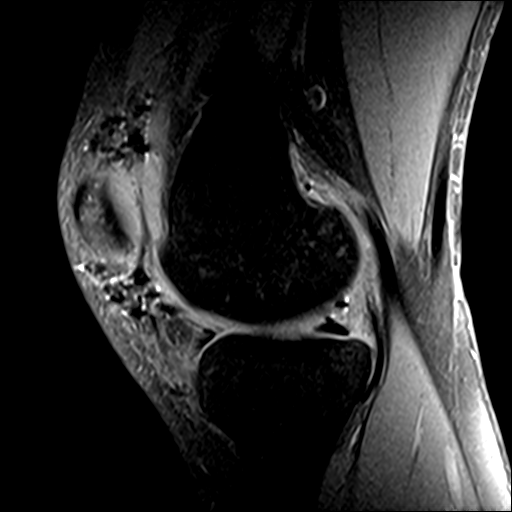
[im 22/27]
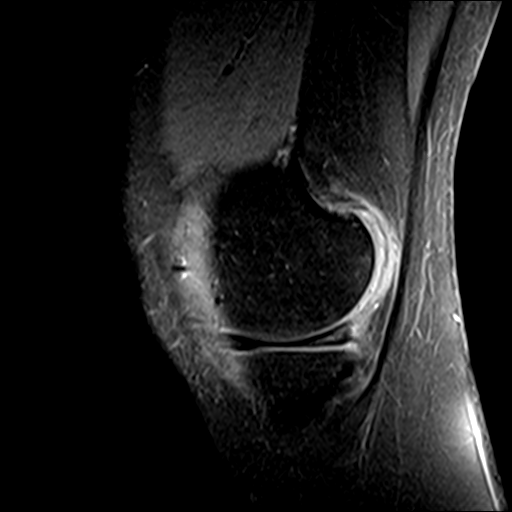
[im 27/27]
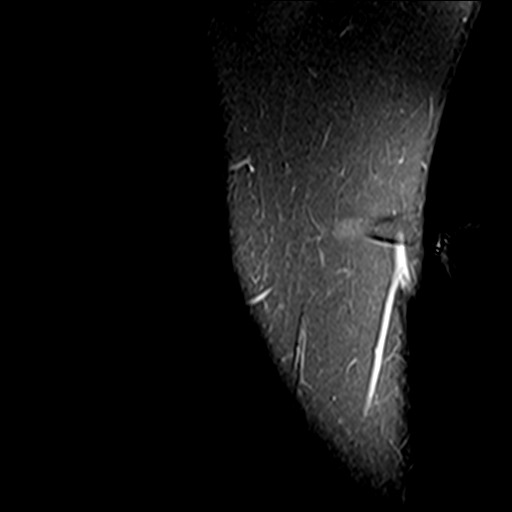

[Series 8: PD fat-sat · coronal · 3.0mm · 0.29mm/px · 7 of 28 slices shown (2 of 3)]
[im 1/28]
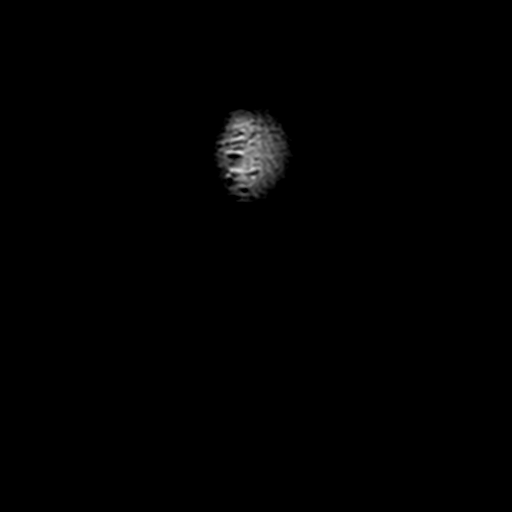
[im 5/28]
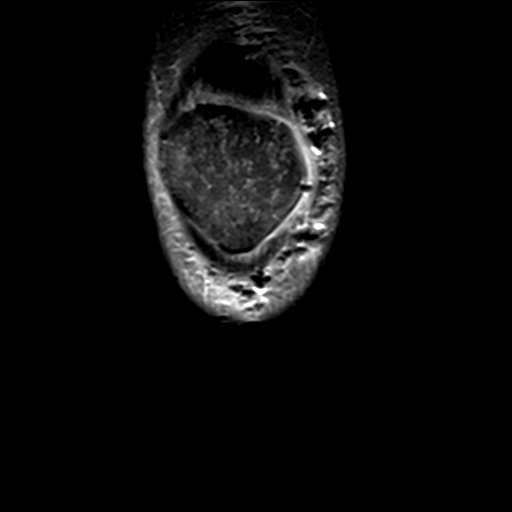
[im 10/28]
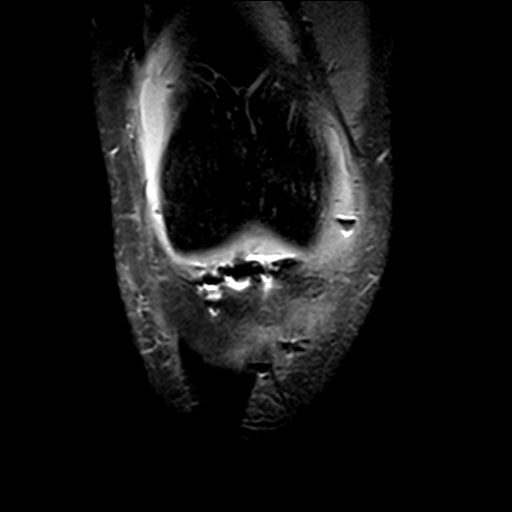
[im 14/28]
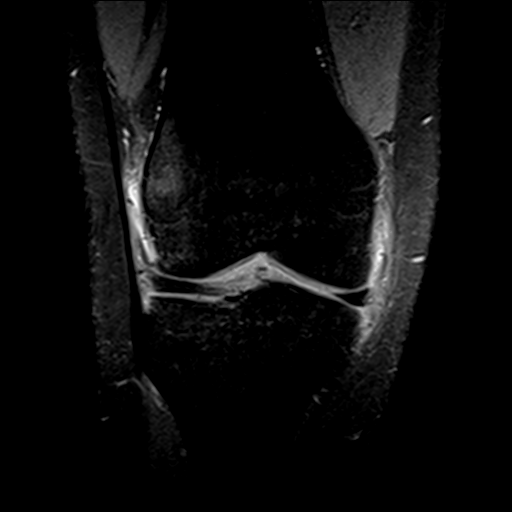
[im 19/28]
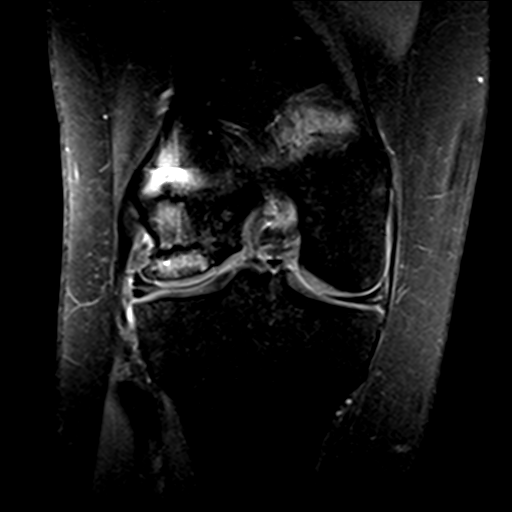
[im 23/28]
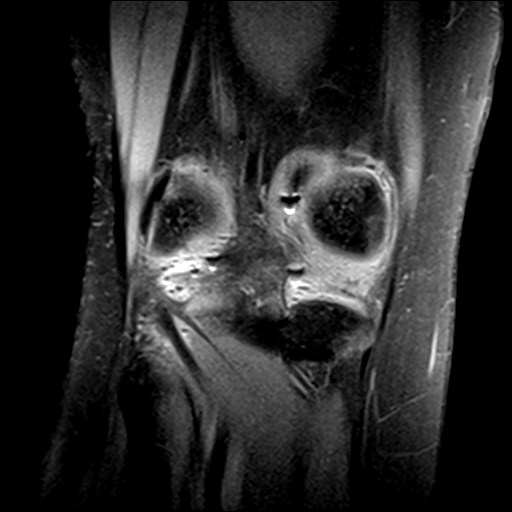
[im 28/28]
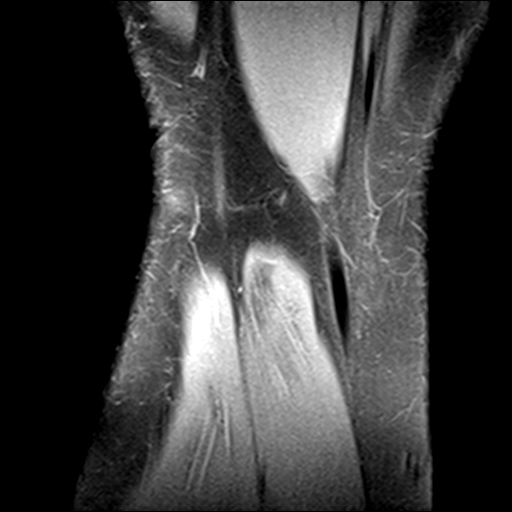

[Series 9: PD fat-sat · coronal · 2.3mm · 0.29mm/px · 3 of 11 slices shown (3 of 3)]
[im 1/11]
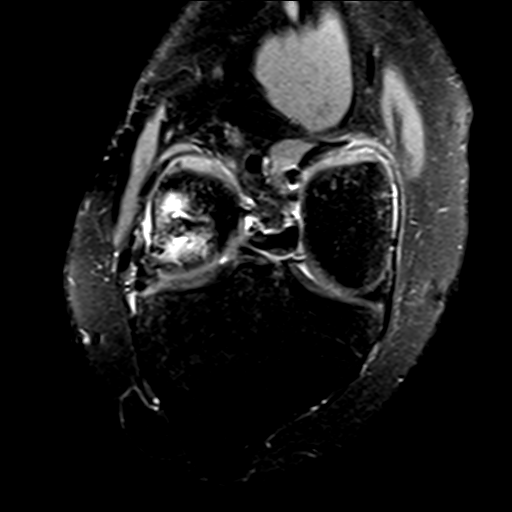
[im 6/11]
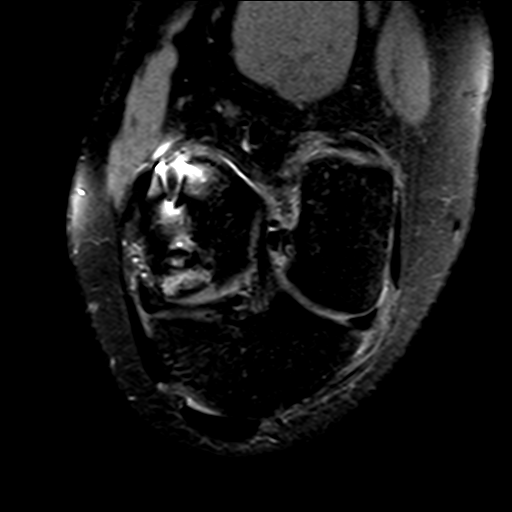
[im 11/11]
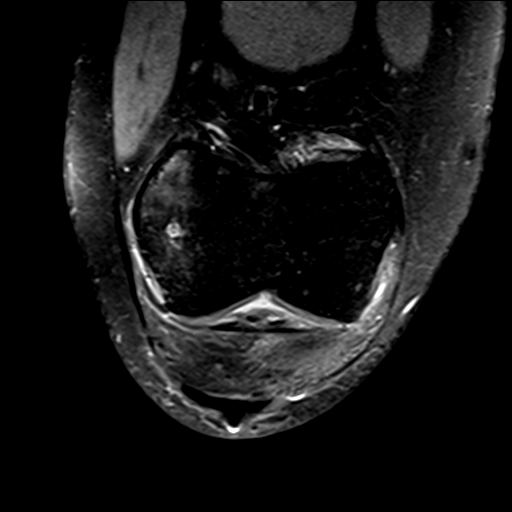

[21 of 40 positions shown; findings below may reference images not displayed]

FINDINGS: MENISCI

Medial: Horizontal nondisplaced tear of the body/posterior horn of
the medial meniscus.

Lateral: Chronic degenerative tear of the body of the lateral
meniscus extending into the anterior and posterior horns, unchanged.

LIGAMENTS

Cruciates: ACL and PCL are intact.

Collaterals: Small amount of fluid about the femoral attachment of
the medial collateral ligament concerning for grade 1 sprain.
Lateral collateral ligament complex is intact.

CARTILAGE

Patellofemoral:  No full-thickness defect

Medial:  No chondral defect.

Lateral:  Postsurgical changes for prior osteochondral defect.

JOINT: Small joint effusion. Normal ROBEL HADAN. No plical
thickening.

POPLITEAL FOSSA: Popliteus tendon is intact. No Baker's cyst.

EXTENSOR MECHANISM: Intact quadriceps tendon. Intact patellar
tendon. Intact lateral patellar retinaculum. Intact medial patellar
retinaculum. Intact MPFL.

BONES: Edema and postsurgical changes in the lateral femoral condyle
for prior osteochondral repair. No evidence of acute fracture or
dislocation.

Other: No fluid collection or hematoma. Muscles are normal.
IMPRESSION: 1. Horizontal nondisplaced tear of the body/posterior horn of the
medial meniscus.

2.  Grade 1 sprain of the medial collateral ligament without tear.

3.  Small joint effusion.

4. Postsurgical changes for prior osteochondral defect surgery in
the lateral femoral condyle without evidence of complications.

5.  Cruciate ligaments are intact.

6.  No evidence of acute fracture.

## 2022-02-16 NOTE — Therapy (Signed)
[place] ?[facility] ?[address] ?[place], [place], [address] ?Phone: [phone]   Fax:  [phone] ? ?Physical Therapy Treatment ? ?Patient Details  ?Name: Angelica Norman ?MRN: [id number] ?Date of Birth: 1991-02-03 ?Referring Provider (PT): Valinda Party MD ? ? ?Encounter Date: 02/16/2022 ? ? PT End of Session - 02/16/22 0809   ? ? Visit Number 23   ? Number of Visits 32   ? Date for PT Re-Evaluation 03/13/22   ? Authorization Type [facility]   ? Authorization Time Period PN sent on 12/22/2021 at 10th visit   ? PT Start Time 0800   ? PT Stop Time 0845   ? PT Time Calculation (min) 45 min   ? Activity Tolerance Patient tolerated treatment well   ? Behavior During Therapy [facility] for tasks assessed/performed   ? ?  ?  ? ?  ? ? ?Past Medical History:  ?Diagnosis Date  ? Abnormal Pap smear of cervix 07/2015  ? Neg/Pos HR HPV--in [place]--no colpo/no treatment  ? Allergy   ? Eczema   ? Family history of adverse reaction to anesthesia   ? "it's hard for my dad to stay asleep during surgery"  ? Thyroid nodule 07/2018  ? ? ?Past Surgical History:  ?Procedure Laterality Date  ? HARVEST BONE GRAFT Right 10/16/2021  ? Procedure: HARVEST ILIAC BONE GRAFT;  Surgeon: Meredith Pel, MD;  Location: [place];  Service: Orthopedics;  Laterality: Right;  ? 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 Procedure: RIGHT KNEE OPEN OSTEOCHONDRAL DEFECT ALLOGRAFT;  Surgeon: Meredith Pel, MD;  Location: [place];  Service: Orthopedics;  Laterality: Right;  ? TYMPANOSTOMY TUBE PLACEMENT Bilateral   ? ? ?There were no vitals filed for this visit. ? ? Subjective Assessment - 02/16/22 0857   ? ? Subjective Pt stataing she went for an MRI this morning.   ? Patient Stated Goals To get back to exercise and get back to work.   ? Currently in Pain? No/denies   ? ?  ?  ? ?  ? ? ? ? ? [facility] PT Assessment - 02/16/22 0001   ? ?  ? Assessment  ? Medical Diagnosis M25.561, G89.29    ? Referring Provider (PT) Valinda Party MD   ? Onset Date/Surgical Date 10/16/21   ?  ? AROM  ? Right Knee Flexion 135   ? ?  ?  ? ?  ? ? ? ? ? ? ? ? ? ? ? ? ? ? ? ? [facility] Adult PT Treatment/Exercise - 02/16/22 0001   ? ?  ? Neuro Re-ed   ? Neuro Re-ed Details  see standing activities   ?  ? Knee/Hip Exercises: Aerobic  ? Recumbent Bike Lvl 5 5 mins   ?  ? Knee/Hip Exercises: Plyometrics  ? Bilateral Jumping Limitations off 4 inch platform with focus on load absorption   ? Other Plyometric Exercises Running in place at wall 45 sec x 3   ?  ? Knee/Hip Exercises: Standing  ? Step Down Limitations lateral step downs with left heel from 8 inch step   ? SLS with Vectors SLS using Y vectors x 10 using sliding disc   ? Other Standing Knee Exercises Standing on Airex: SLS on Rt wood chop using 5 # on BATCA, lawnmower cranks 5# on BATCA each x 15, Standing on Airex SLR on Rt, chest press with 10# in each UE x 15, Agility ladder: drills focusing on SLS  landing and lateral stepping, gentle jumping out and in,   ? Other Standing Knee Exercises lunges 2x10 alteranating leading leg   ? ?  ?  ? ?  ? ? ? ? ? ? ? ? ? ? ? ? PT Short Term Goals - 01/12/22 0820   ? ?  ? PT SHORT TERM GOAL #1  ? Title be independent in initial HEP   ? Status Achieved   ?  ? PT SHORT TERM GOAL #2  ? Title report a 30% reduction in frequency and intensity R knee pain with daily activity.   ? Status Achieved   ? ?  ?  ? ?  ? ? ? ? PT Long Term Goals - 02/04/22 1558   ? ?  ? PT LONG TERM GOAL #1  ? Title be independent in advanced HEP   ? Status On-going   ?  ? PT LONG TERM GOAL #2  ? Title report a 70% reduction in frequency and intensity of R knee pain with daily tasks and after weight training   ? Status Achieved   ?  ? PT LONG TERM GOAL #3  ? Title Increase R knee ROM to at least 0 to 125 to allow her to perform work activities.   ? Status Achieved   ?  ? PT LONG TERM GOAL #4  ? Title improve FOTO to > or = to 74 to improve funtion   ? Status  Achieved   ?  ? PT LONG TERM GOAL #5  ? Title Increase R knee strength to 5/5 to allow pt to return to ability to perform leisure and work activities.   ? Status Achieved   ? ?  ?  ? ?  ? ? ? ? ? ? ? ? Plan - 02/16/22 0858   ? ? Clinical Impression Statement Pt is currently back to work with no restrictions but reporting being careful with squatting and dynamic balance activities. Pt awaiting MRI results to rule out meniscus tear. Continue with 1x/ week to progression of LTG's to maximize pt's function.   ? Examination-Activity Limitations Locomotion Level;Squat;Stairs;Stand   ? Examination-Participation Restrictions Occupation;Community Activity   ? Stability/Clinical Decision Making Stable/Uncomplicated   ? Rehab Potential Excellent   ? PT Frequency 1x / week   ? PT Duration 8 weeks   ? PT Treatment/Interventions ADLs/Self Care Home Management;Aquatic Therapy;Cryotherapy;Electrical Stimulation;Iontophoresis 4mg /ml Dexamethasone;Moist Heat;Gait training;Stair training;Functional mobility training;Therapeutic activities;Therapeutic exercise;Balance training;Neuromuscular re-education;Patient/family education;Manual techniques;Scar mobilization;Passive range of motion;Dry needling;Taping;Vasopneumatic Device;Joint Manipulations   ? PT Next Visit Plan 1x/week, dynamic balance control, early plyometrics.   ? PT Home Exercise Plan congtinue with lunges, squats, SLS activities, step ups/downs   ? Consulted and Agree with Plan of Care Patient   ? ?  ?  ? ?  ? ? ?Patient will benefit from skilled therapeutic intervention in order to improve the following deficits and impairments:  Difficulty walking, Decreased range of motion, Increased muscle spasms, Pain, Decreased balance, Decreased strength ? ?Visit Diagnosis: ?Chronic pain of right knee ? ?Difficulty in walking, not elsewhere classified ? ?Muscle weakness (generalized) ? ? ? ? ?Problem List ?Patient Active Problem List  ? Diagnosis Date Noted  ? Osteochondritis  dissecans   ? Thyroid nodule 07/17/2019  ? Eczema 07/19/2017  ? Allergic rhinitis 07/19/2017  ? ? ?Oretha Caprice, PT, MPT ?02/16/2022, 9:01 AM ? ?[place] ?[facility] ?[address] ?[place], [place], [address] ?Phone: [phone]   Fax:  [phone] ? ?Name:  [place] ?MRN: [id number] ?Date of Birth: [birth year] ? ? ? ?

## 2022-02-18 ENCOUNTER — Encounter: Payer: No Typology Code available for payment source | Admitting: Rehabilitative and Restorative Service Providers"

## 2022-02-20 ENCOUNTER — Ambulatory Visit (INDEPENDENT_AMBULATORY_CARE_PROVIDER_SITE_OTHER): Payer: Worker's Compensation | Admitting: Orthopedic Surgery

## 2022-02-20 ENCOUNTER — Encounter: Payer: Self-pay | Admitting: Orthopedic Surgery

## 2022-02-20 ENCOUNTER — Other Ambulatory Visit: Payer: Self-pay

## 2022-02-20 DIAGNOSIS — S83241D Other tear of medial meniscus, current injury, right knee, subsequent encounter: Secondary | ICD-10-CM

## 2022-02-20 NOTE — Progress Notes (Signed)
? ?Office Visit Note ?  ?Patient: Angelica Norman           ?Date of Birth: 1991-11-18           ?MRN: [id number] ?Visit Date: 02/20/2022 ?Requested by: [place], Betty G, MD ?[phone] ?[place],  [place] [address] ?PCP: [place], Betty G, MD ? ?Subjective: ?Chief Complaint  ?Patient presents with  ? Right Knee - Follow-up  ?  Scan review  ? ? ?HPI: Angelica Norman is a 31 year old patient here to review MRI scan of her right knee.  She sustained a hyperflexion injury at work on February 9.  MRI scan for her surgery done very late last years compared to the new MRI scan.  New scan shows good incorporation of the osteochondral allograft on that lateral side but she does have a new nondisplaced inferior posterior medial meniscal tear.  She does report some instability and catching.  ACL PCL intact.  Stairs are difficult for her.  She wants to get back to power lifting but I do not think that is a great idea anytime in the near future.  She has been doing rehab with strengthening on her own along with some dynamic work. ?             ?ROS: All systems reviewed are negative as they relate to the chief complaint within the history of present illness.  Patient denies  fevers or chills. ? ? ?Assessment & Plan: ?Visit Diagnoses:  ?1. Acute medial meniscus tear of right knee, subsequent encounter   ? ? ?Plan: Impression is acute medial meniscal tear with inferior articular surface tear reaching the inferior surface.  ACL is intact.  Mild knee effusion today with some posterior medial joint line tenderness.  Flap does not appear unstable.  Plan is 8-week return for clinical recheck.  We will see if this calms down on its own.  Advised her against any significant loadbearing with the knee flexed past 90 degrees.  Okay to do quad and hamstring strengthening otherwise as long as its not involving flexion greater than 90 degrees. ? ?Follow-Up Instructions: Return in about 8 weeks (around 04/17/2022).  ? ?Orders:  ?No  orders of the defined types were placed in this encounter. ? ?No orders of the defined types were placed in this encounter. ? ? ? ? Procedures: ?No procedures performed ? ? ?Clinical Data: ?No additional findings. ? ?Objective: ?Vital Signs: There were no vitals taken for this visit. ? ?Physical Exam:  ? ?Constitutional: Patient appears well-developed ?HEENT:  ?Head: Normocephalic ?Eyes:EOM are normal ?Neck: Normal range of motion ?Cardiovascular: Normal rate ?Pulmonary/chest: Effort normal ?Neurologic: Patient is alert ?Skin: Skin is warm ?Psychiatric: Patient has normal mood and affect ? ? ?Ortho Exam: Ortho exam demonstrates trace effusion right knee with excellent range of motion.  No coarse grinding or crepitus is present.  McMurray compression testing equivocal on the right negative on the left.  There is some posterior medial joint line tenderness to direct palpation.  No tenderness over the quad tendon or patellar tendon. ? ?Specialty Comments:  ?No specialty comments available. ? ?Imaging: ?No results found. ? ? ?PMFS History: ?Patient Active Problem List  ? Diagnosis Date Noted  ? Osteochondritis dissecans   ? Thyroid nodule 07/17/2019  ? Eczema 07/19/2017  ? Allergic rhinitis 07/19/2017  ? ?Past Medical History:  ?Diagnosis Date  ? Abnormal Pap smear of cervix 07/2015  ? Neg/Pos HR HPV--in [place]--no colpo/no treatment  ? Allergy   ? Eczema   ?  Family history of adverse reaction to anesthesia   ? "it's hard for my dad to stay asleep during surgery"  ? Thyroid nodule 07/2018  ?  ?Family History  ?Problem Relation Age of Onset  ? Arthritis Mother   ? Hyperlipidemia Mother   ? Diabetes Father   ? Cancer Father   ?     prostate  ? Thyroid disease Father   ?     hypothyroid  ? Heart disease Maternal Grandmother   ? Hyperlipidemia Maternal Grandmother   ? Stroke Maternal Grandmother   ? Stroke Paternal Grandmother   ? Hypertension Paternal Grandmother   ? Cancer Paternal Grandfather 30  ?     Dec colon cancer   ?  ?Past Surgical History:  ?Procedure Laterality Date  ? HARVEST BONE GRAFT Right 10/16/2021  ? Procedure: HARVEST ILIAC BONE GRAFT;  Surgeon: Cammy Copa, MD;  Location: [facility] OR;  Service: Orthopedics;  Laterality: Right;  ? 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 Procedure: RIGHT KNEE OPEN OSTEOCHONDRAL DEFECT ALLOGRAFT;  Surgeon: Cammy Copa, MD;  Location: [facility] OR;  Service: Orthopedics;  Laterality: Right;  ? TYMPANOSTOMY TUBE PLACEMENT Bilateral   ? ?Social History  ? ?Occupational History  ? Not on file  ?Tobacco Use  ? Smoking status: Never  ? Smokeless tobacco: Never  ?Vaping Use  ? Vaping Use: Never used  ?Substance and Sexual Activity  ? Alcohol use: Yes  ?  Alcohol/week: 3.0 standard drinks  ?  Types: 3 Cans of beer per week  ? Drug use: No  ? Sexual activity: Yes  ?  Partners: Male  ?  Birth control/protection: Implant  ?  Comment: Nexplanon inserted 10-26-14/Lt.arm  ? ? ? ? ? ?

## 2022-02-23 ENCOUNTER — Encounter: Payer: Self-pay | Admitting: Physical Therapy

## 2022-02-23 ENCOUNTER — Other Ambulatory Visit: Payer: Self-pay

## 2022-02-23 ENCOUNTER — Ambulatory Visit: Payer: No Typology Code available for payment source | Admitting: Physical Therapy

## 2022-02-23 DIAGNOSIS — G8929 Other chronic pain: Secondary | ICD-10-CM

## 2022-02-23 DIAGNOSIS — R262 Difficulty in walking, not elsewhere classified: Secondary | ICD-10-CM

## 2022-02-23 DIAGNOSIS — M25561 Pain in right knee: Secondary | ICD-10-CM

## 2022-02-23 DIAGNOSIS — M6281 Muscle weakness (generalized): Secondary | ICD-10-CM

## 2022-02-23 NOTE — Therapy (Signed)
[place] [facility] ?[address] ?[place], [place], [address] ?Phone: [phone]   Fax:  [phone] ? ?Physical Therapy Treatment ? ?Patient Details  ?Name: Angelica Norman ?MRN: [id number] ?Date of Birth: 12/04/1991 ?Referring Provider (PT): Danton Sewer MD ? ? ?Encounter Date: 02/23/2022 ? ? PT End of Session - 02/23/22 0810   ? ? Visit Number 24   ? Number of Visits 32   ? Date for PT Re-Evaluation 03/13/22   ? Authorization Type [facility]   ? Authorization Time Period PN sent on 12/22/2021 at 10th visit   ? PT Start Time [phone]   ? PT Stop Time 0845   ? PT Time Calculation (min) 42 min   ? Activity Tolerance Patient tolerated treatment well   ? Behavior During Therapy [facility] for tasks assessed/performed   ? ?  ?  ? ?  ? ? ?Past Medical History:  ?Diagnosis Date  ? Abnormal Pap smear of cervix 07/2015  ? Neg/Pos HR HPV--in [place]--no colpo/no treatment  ? Allergy   ? Eczema   ? Family history of adverse reaction to anesthesia   ? "it's hard for my dad to stay asleep during surgery"  ? Thyroid nodule 07/2018  ? ? ?Past Surgical History:  ?Procedure Laterality Date  ? HARVEST BONE GRAFT Right 10/16/2021  ? Procedure: HARVEST ILIAC BONE GRAFT;  Surgeon: Cammy Copa, MD;  Location: [facility] OR;  Service: Orthopedics;  Laterality: Right;  ? 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 Procedure: RIGHT KNEE OPEN OSTEOCHONDRAL DEFECT ALLOGRAFT;  Surgeon: Cammy Copa, MD;  Location: [facility] OR;  Service: Orthopedics;  Laterality: Right;  ? TYMPANOSTOMY TUBE PLACEMENT Bilateral   ? ? ?There were no vitals filed for this visit. ? ? Subjective Assessment - 02/23/22 0811   ? ? Subjective Pt with results of her MRI with new dx of medial meniscus tear, nondisplaced.   ? Patient Stated Goals To get back to exercise and get back to work.   ? Currently in Pain? Yes   ? Pain Score 3    ? Pain Location Knee   ? Pain Orientation Right;Medial   ? Pain  Descriptors / Indicators Aching;Sore   ? Pain Type Acute pain   ? Pain Onset More than a month ago   ? ?  ?  ? ?  ? ? ? ? ? [facility] PT Assessment - 02/23/22 0001   ? ?  ? Assessment  ? Medical Diagnosis M25.561, G89.29   ? Referring Provider (PT) Danton Sewer MD   ? Onset Date/Surgical Date 10/16/21   ?  ? AROM  ? Right Knee Flexion 90   ? ?  ?  ? ?  ? ? ? ? ? ? ? ? ? ? ? ? ? ? ? ? [facility] Adult PT Treatment/Exercise - 02/23/22 0001   ? ?  ? Neuro Re-ed   ? Neuro Re-ed Details  Single leg stance on BOSU ball 1 minute x 2 on Rt LE, SLS on balance Beam 1 minute x 2, tandum stance on beam with calf raises x 15,   ?  ? Knee/Hip Exercises: Aerobic  ? Recumbent Bike Lvl 5 5 mins   ?  ? Knee/Hip Exercises: Machines for Strengthening  ? Cybex Leg Press 150# 3x15, bilateral LE's, Rt LE only: 75#, 2x10   ? ?  ?  ? ?  ? ? ? ? ? ? ? ? ? ? ? ? PT Short  Term Goals - 01/12/22 0820   ? ?  ? PT SHORT TERM GOAL #1  ? Title be independent in initial HEP   ? Status Achieved   ?  ? PT SHORT TERM GOAL #2  ? Title report a 30% reduction in frequency and intensity R knee pain with daily activity.   ? Status Achieved   ? ?  ?  ? ?  ? ? ? ? PT Long Term Goals - 02/04/22 1558   ? ?  ? PT LONG TERM GOAL #1  ? Title be independent in advanced HEP   ? Status On-going   ?  ? PT LONG TERM GOAL #2  ? Title report a 70% reduction in frequency and intensity of R knee pain with daily tasks and after weight training   ? Status Achieved   ?  ? PT LONG TERM GOAL #3  ? Title Increase R knee ROM to at least 0 to 125 to allow her to perform work activities.   ? Status Achieved   ?  ? PT LONG TERM GOAL #4  ? Title improve FOTO to > or = to 74 to improve funtion   ? Status Achieved   ?  ? PT LONG TERM GOAL #5  ? Title Increase R knee strength to 5/5 to allow pt to return to ability to perform leisure and work activities.   ? Status Achieved   ? ?  ?  ? ?  ? ? ? ? ? ? ? ? Plan - 02/23/22 8676   ? ? Clinical Impression Statement Pt arriving with results of her  MRI on 3/13/ 2023 revealed acute medial meniscus tear of right knee. Pt with new orders to limit Rt knee flexion to 90 degrees with holds on heavy lifting. Pt tolerated exercises well with restrictions based on new dx and orders from Dr. August Saucer. 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 We discussed utilizing other empoyees for help to decrease further injury to pt's Rt knee. Treatment focusing today on single leg stance with dynamic concepts. Continue skilled PT to maximize pt's function.   ? Examination-Activity Limitations Locomotion Level;Squat;Stairs;Stand   ? Examination-Participation Restrictions Occupation;Community Activity   ? Stability/Clinical Decision Making Stable/Uncomplicated   ? Rehab Potential Excellent   ? PT Frequency 1x / week   ? PT Duration 8 weeks   ? PT Treatment/Interventions ADLs/Self Care Home Management;Aquatic Therapy;Cryotherapy;Electrical Stimulation;Iontophoresis 4mg /ml Dexamethasone;Moist Heat;Gait training;Stair training;Functional mobility training;Therapeutic activities;Therapeutic exercise;Balance training;Neuromuscular re-education;Patient/family education;Manual techniques;Scar mobilization;Passive range of motion;Dry needling;Taping;Vasopneumatic Device;Joint Manipulations   ? PT Next Visit Plan 1x/week, dynamic balance control, early plyometrics.   ? PT Home Exercise Plan congtinue with lunges, squats, SLS activities, step ups/downs   ? Consulted and Agree with Plan of Care Patient   ? ?  ?  ? ?  ? ? ?Patient will benefit from skilled therapeutic intervention in order to improve the following deficits and impairments:  Difficulty walking, Decreased range of motion, Increased muscle spasms, Pain, Decreased balance, Decreased strength ? ?Visit Diagnosis: ?Chronic pain of right knee ? ?Difficulty in walking, not elsewhere classified ? ?Muscle weakness  (generalized) ? ? ? ? ?Problem List ?Patient Active Problem List  ? Diagnosis Date Noted  ? Osteochondritis dissecans   ? Thyroid nodule 07/17/2019  ? Eczema 07/19/2017  ? Allergic rhinitis 07/19/2017  ? ? ?  Sharmon LeydenJennifer R Kelley Polinsky, PT, MPT ?02/23/2022, 12:51 PM ? ?[place] [facility] ?[address] ?[place], [place], [address] ?Phone: [phone]   Fax:  [phone] ? ?Name: Angelica Norman ?MRN: [id number] ?Date of Birth: [birth year] ? ? ? ?

## 2022-02-25 ENCOUNTER — Encounter: Payer: No Typology Code available for payment source | Admitting: Rehabilitative and Restorative Service Providers"

## 2022-03-02 ENCOUNTER — Ambulatory Visit: Payer: No Typology Code available for payment source | Admitting: Physical Therapy

## 2022-03-02 ENCOUNTER — Other Ambulatory Visit: Payer: Self-pay

## 2022-03-02 ENCOUNTER — Encounter: Payer: Self-pay | Admitting: Physical Therapy

## 2022-03-02 DIAGNOSIS — R262 Difficulty in walking, not elsewhere classified: Secondary | ICD-10-CM

## 2022-03-02 DIAGNOSIS — G8929 Other chronic pain: Secondary | ICD-10-CM

## 2022-03-02 DIAGNOSIS — M6281 Muscle weakness (generalized): Secondary | ICD-10-CM

## 2022-03-02 DIAGNOSIS — R252 Cramp and spasm: Secondary | ICD-10-CM

## 2022-03-02 DIAGNOSIS — M25561 Pain in right knee: Secondary | ICD-10-CM

## 2022-03-02 NOTE — Therapy (Addendum)
[place] [facility] ?[address] ?[place], [place], [address] ?Phone: [phone]   Fax:  [phone] ? ?Physical Therapy Treatment ? ?Patient Details  ?Name: Angelica Norman ?MRN: [id number] ?Date of Birth: 1990/12/28 ?Referring Provider (PT): Danton Sewer MD ? ? ?Encounter Date: 03/02/2022 ? ? PT End of Session - 03/02/22 0806   ? ? Visit Number 25   ? Number of Visits 32   ? Date for PT Re-Evaluation 04/20/2022  ? Authorization Type [facility]   ? Authorization Time Period PN sent on 12/22/2021 at 10th visit, Recert sent on 03/02/2022 until 04/20/2022  ? PT Start Time 0802   ? PT Stop Time 0845   ? PT Time Calculation (min) 43 min   ? Activity Tolerance Patient tolerated treatment well   ? Behavior During Therapy [facility] for tasks assessed/performed   ? ?  ?  ? ?  ? ? ?Past Medical History:  ?Diagnosis Date  ? Abnormal Pap smear of cervix 07/2015  ? Neg/Pos HR HPV--in [place]--no colpo/no treatment  ? Allergy   ? Eczema   ? Family history of adverse reaction to anesthesia   ? "it's hard for my dad to stay asleep during surgery"  ? Thyroid nodule 07/2018  ? ? ?Past Surgical History:  ?Procedure Laterality Date  ? HARVEST BONE GRAFT Right 10/16/2021  ? Procedure: HARVEST ILIAC BONE GRAFT;  Surgeon: Cammy Copa, MD;  Location: [facility] OR;  Service: Orthopedics;  Laterality: Right;  ? 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 Procedure: RIGHT KNEE OPEN OSTEOCHONDRAL DEFECT ALLOGRAFT;  Surgeon: Cammy Copa, MD;  Location: [facility] OR;  Service: Orthopedics;  Laterality: Right;  ? TYMPANOSTOMY TUBE PLACEMENT Bilateral   ? ? ?There were no vitals filed for this visit. ? ? Subjective Assessment - 03/02/22 0812   ? ? Subjective Pt stating overall improvements in pain in her Rt knee. Pt still reporting pain with transfering her pt's at work that require more assistance.   ? Pertinent History unremarkable   ? Limitations Walking;Other (comment)   ? How  long can you stand comfortably? 20-30 min   ? How long can you walk comfortably? 10 min   ? Patient Stated Goals To get back to exercise and get back to work.   ? Currently in Pain? Yes   ? Pain Score 2    ? Pain Location Knee   ? Pain Orientation Right;Medial   ? Pain Descriptors / Indicators Aching;Sore   ? Pain Type Acute pain   ? Pain Onset More than a month ago   ? ?  ?  ? ?  ? ? ? ? ? [facility] PT Assessment - 03/02/22 0001   ? ?  ? Assessment  ? Medical Diagnosis M25.561, G89.29   ? Referring Provider (PT) Danton Sewer MD   ? Onset Date/Surgical Date 10/16/21   ?  ? AROM  ? Right Knee Flexion 90   limited to 90 degrees for functional activities  ? ?  ?  ? ?  ? ? ? ? ? ? ? ? ? ? ? ? ? ? ? ? [facility] Adult PT Treatment/Exercise - 03/02/22 0001   ? ?  ? Neuro Re-ed   ? Neuro Re-ed Details  SLS: vectors using sliding disc x 20, Step downs on 8 inch step with Rt meidal knee instbilty noted when compared to left LE x 20, walking lunges, static lunges x 20 each   ?  ?  Knee/Hip Exercises: Machines for Strengthening  ? Cybex Leg Press 150# 3x15, bilateral LE's, Rt LE only: 75#, 2x10, calf raises 50# x 15 Rt LE only   ? ?  ?  ? ?  ? ? ? ? ? ? ? ? ? ? ? ? PT Short Term Goals - 03/02/22 0829   ? ?  ? PT SHORT TERM GOAL #1  ? Status Achieved   ?  ? PT SHORT TERM GOAL #2  ? Title report a 30% reduction in frequency and intensity R knee pain with daily activity.   ? Status Achieved   ? ?  ?  ? ?  ? ? ? ? 03/02/22 0835  ?PT LONG TERM GOAL #1  ?Title be independent in advanced HEP  ?Status On-going  ?Target Date 04/20/22  ?PT LONG TERM GOAL #2  ?Title report a 70% reduction in frequency and intensity of R knee pain with daily tasks and after weight training  ?Status Achieved  ?PT LONG TERM GOAL #3  ?Title Increase R knee ROM to at least 0 to 125 to allow her to perform work activities.  ?Status Achieved  ?PT LONG TERM GOAL #4  ?Title improve FOTO to > or = to 74 to improve funtion  ?Status Achieved  ?PT LONG TERM GOAL #5  ?Title  Increase R knee strength to 5/5 to allow pt to return to ability to perform leisure and work activities.  ?Status Achieved  ?PT LONG TERM GOAL #6  ?Title Pt will be able to report no pain with step downs descending 1 flight of stairs.  ?Target Date 04/20/22  ?PT LONG TERM GOAL #7  ?Title Pt will be able to report no pain with work related transfers of mod to max assist pt's on her neuro rehab unit.  ?Status New  ?Target Date 04/20/22  ? ? ? ? ? ? ? Plan - 03/02/22 0817   ? ? Clinical Impression Statement Pt arriving still reporting pain with mod to max assist transfers at work. Pt focusing on neuromuscular re-education with more single leg stance activities. Pt reporting instability with step downs and squatting. Pt tolerating exercises well. Plan to discuss recertificaiton next visit. Conitnue to progress toward returning to PLOF and LTG's set.   ? Examination-Activity Limitations Locomotion Level;Squat;Stairs;Stand   ? Stability/Clinical Decision Making Stable/Uncomplicated   ? Rehab Potential Excellent   ? PT Frequency 1x / week   ? PT Duration 8 weeks   ? PT Treatment/Interventions ADLs/Self Care Home Management;Aquatic Therapy;Cryotherapy;Electrical Stimulation;Iontophoresis 4mg /ml Dexamethasone;Moist Heat;Gait training;Stair training;Functional mobility training;Therapeutic activities;Therapeutic exercise;Balance training;Neuromuscular re-education;Patient/family education;Manual techniques;Scar mobilization;Passive range of motion;Dry needling;Taping;Vasopneumatic Device;Joint Manipulations   ? PT Next Visit Plan 1x/week, dynamic balance control, early plyometrics.   ? PT Home Exercise Plan congtinue with lunges, squats, SLS activities, step ups/downs   ? Consulted and Agree with Plan of Care Patient   ? ?  ?  ? ?  ? ? ?Patient will benefit from skilled therapeutic intervention in order to improve the following deficits and impairments:  Difficulty walking, Decreased range of motion, Increased muscle spasms,  Pain, Decreased balance, Decreased strength ? ?Visit Diagnosis: ?Chronic pain of right knee ? ?Difficulty in walking, not elsewhere classified ? ?Muscle weakness (generalized) ? ?Cramp and spasm ? ? ? ? ?Problem List ?Patient Active Problem List  ? Diagnosis Date Noted  ? Osteochondritis dissecans   ? Thyroid nodule 07/17/2019  ? Eczema 07/19/2017  ? Allergic rhinitis 07/19/2017  ? ? ?07/21/2017, PT,  MPT ?03/02/2022, 8:48 AM ? ?[place] [facility] ?[address] ?[place], [place], [address] ?Phone: [phone]   Fax:  [phone] ? ?Name: Jeannine KittenStephanie Michaela Coots ?MRN: [id number] ?Date of Birth: 1991-01-27 ? ? ? ?

## 2022-03-04 ENCOUNTER — Encounter: Payer: No Typology Code available for payment source | Admitting: Rehabilitative and Restorative Service Providers"

## 2022-03-16 ENCOUNTER — Encounter: Payer: Self-pay | Admitting: Physical Therapy

## 2022-03-17 NOTE — Addendum Note (Signed)
Addended by: Narda Amber R on: 03/17/2022 01:01 PM ? ? Modules accepted: Orders ? ?

## 2022-04-01 ENCOUNTER — Encounter: Payer: Self-pay | Admitting: Physical Therapy

## 2022-04-01 ENCOUNTER — Ambulatory Visit: Payer: Self-pay | Admitting: Physical Therapy

## 2022-04-01 DIAGNOSIS — M6281 Muscle weakness (generalized): Secondary | ICD-10-CM | POA: Diagnosis not present

## 2022-04-01 DIAGNOSIS — R252 Cramp and spasm: Secondary | ICD-10-CM

## 2022-04-01 DIAGNOSIS — G8929 Other chronic pain: Secondary | ICD-10-CM

## 2022-04-01 DIAGNOSIS — R262 Difficulty in walking, not elsewhere classified: Secondary | ICD-10-CM

## 2022-04-01 DIAGNOSIS — M25561 Pain in right knee: Secondary | ICD-10-CM | POA: Diagnosis not present

## 2022-04-01 NOTE — Therapy (Addendum)
[facility] [address] [place], [place], [address] Phone: [phone]   Fax:  [phone]  Physical Therapy Treatment/Discharge Summary  Patient Details  Name: Angelica Norman MRN: [id number] Date of Birth: Jul 05, 1991 Referring Provider (PT): Valinda Party MD   Encounter Date: 04/01/2022   PT End of Session - 04/01/22 1151     Visit Number 67    Authorization Type [place] [employer]    Authorization Time Period PN sent on 12/22/2021 at 10th visit, approved with insurance for 12 more visits  from 03/01/2022 until 07/06/2022    Authorization - Visit Number 2    Authorization - Number of Visits 12    PT Start Time 8937    PT Stop Time 1230    PT Time Calculation (min) 42 min    Activity Tolerance Patient tolerated treatment well    Behavior During Therapy [facility] for tasks assessed/performed             Past Medical History:  Diagnosis Date   Abnormal Pap smear of cervix 07/2015   Neg/Pos HR HPV--in [place]--no colpo/no treatment   Allergy    Eczema    Family history of adverse reaction to anesthesia    "it's hard for my dad to stay asleep during surgery"   Thyroid nodule 07/2018    Past Surgical History:  Procedure Laterality Date   HARVEST BONE GRAFT Right 10/16/2021   Procedure: HARVEST ILIAC BONE GRAFT;  Surgeon: Meredith Pel, MD;  Location: [place];  Service: Orthopedics;  Laterality: Right;   OSTEOCHONDRAL DEFECT REPAIR/RECONSTRUCTION Right 10/16/2021   Procedure: RIGHT KNEE OPEN OSTEOCHONDRAL DEFECT ALLOGRAFT;  Surgeon: Meredith Pel, MD;  Location: [place];  Service: Orthopedics;  Laterality: Right;   TYMPANOSTOMY TUBE PLACEMENT Bilateral     There were no vitals filed for this visit.   Subjective Assessment - 04/01/22 1235     Subjective Pt stating she still experiences 2-3/10 pain with Rt knee flexion. No pain reported at rest only when she standing after prolonged sitting or not moving.     Limitations Walking;Other (comment)    How long can you stand comfortably? 20-30 min    How long can you walk comfortably? 10 min    Patient Stated Goals To get back to exercise and get back to work.    Currently in Pain? Yes    Pain Score 3     Pain Location Knee    Pain Orientation Right    Pain Descriptors / Indicators Aching    Pain Type Surgical pain    Pain Onset More than a month ago                               [facility] Adult PT Treatment/Exercise - 04/01/22 0001       Neuro Re-ed    Neuro Re-ed Details  SLS with cone taps set up around Airex mat x 10 revolutions, Step downs with 5 # weight on Rt LE, walking lunges, lateral squats with Level 3 theraband x 20, squats on BOSU ball with Level 3 theraband pulled in all direction around Rt knee, Y balance: Rt: 97.6%, Left 99.0%      Knee/Hip Exercises: Machines for Strengthening   Cybex Leg Press 168# bilateral LE's x 25, Rt LE only 100# x 20      Knee/Hip Exercises: Standing   Forward Step Up Limitations Power step up with 5# ankle  weight on Rt LE x 15                       PT Short Term Goals - 03/02/22 0829       PT SHORT TERM GOAL #1   Status Achieved      PT SHORT TERM GOAL #2   Title report a 30% reduction in frequency and intensity R knee pain with daily activity.    Status Achieved               PT Long Term Goals - 04/01/22 1246       PT LONG TERM GOAL #1   Title be independent in advanced HEP    Period Weeks    Status On-going    Target Date 04/20/22      PT LONG TERM GOAL #2   Title report a 70% reduction in frequency and intensity of R knee pain with daily tasks and after weight training    Baseline Pt weight training within her precuations    Period Weeks    Status Achieved    Target Date 06/09/21      PT LONG TERM GOAL #3   Title Increase R knee ROM to at least 0 to 125 to allow her to perform work activities.    Status Achieved      PT LONG TERM GOAL #4    Title improve FOTO to > or = to 74 to improve funtion    Baseline 49% at eval, 73% on 12/22/2021, 84% on 02/04/2022,    Status Achieved      PT LONG TERM GOAL #5   Title Increase R knee strength to 5/5 to allow pt to return to ability to perform leisure and work activities.    Status Achieved      PT LONG TERM GOAL #6   Title Pt will be able to report no pain with step downs descending 1 flight of stairs.    Status On-going      PT LONG TERM GOAL #7   Title Pt will be able to report no pain with work related transfers of mod to max assist pt's on her neuro rehab unit.    Status On-going                   Plan - 04/01/22 1241     Clinical Impression Statement Pt was approved for 12 additional visits from 03/01/2022 to 07/06/2022. Pt still reporting pain of 2-3/10 in her Rt knee with flexion based activities. Pt tolerating all exercises well and continued to improve in strength and dynamic balance. Next visit to focus on more plyometrics, jumping and hoping. Continue skilled PT to maximie function.    Examination-Activity Limitations Locomotion Level;Squat;Stairs;Stand    Examination-Participation Restrictions Occupation;Community Activity    Stability/Clinical Decision Making Stable/Uncomplicated    Rehab Potential Excellent    PT Frequency 1x / week    PT Duration 8 weeks    PT Treatment/Interventions ADLs/Self Care Home Management;Aquatic Therapy;Cryotherapy;Electrical Stimulation;Iontophoresis 31m/ml Dexamethasone;Moist Heat;Gait training;Stair training;Functional mobility training;Therapeutic activities;Therapeutic exercise;Balance training;Neuromuscular re-education;Patient/family education;Manual techniques;Scar mobilization;Passive range of motion;Dry needling;Taping;Vasopneumatic Device;Joint Manipulations    PT Next Visit Plan dynamic balance control, early plyometrics.    PT Home Exercise Plan congtinue with lunges, squats, SLS activities, step ups/downs    Consulted and  Agree with Plan of Care Patient             Patient will benefit from skilled therapeutic  intervention in order to improve the following deficits and impairments:  Difficulty walking, Decreased range of motion, Increased muscle spasms, Pain, Decreased balance, Decreased strength  Visit Diagnosis: Chronic pain of right knee  Difficulty in walking, not elsewhere classified  Muscle weakness (generalized)  Cramp and spasm     Problem List Patient Active Problem List   Diagnosis Date Noted   Osteochondritis dissecans    Thyroid nodule 07/17/2019   Eczema 07/19/2017   Allergic rhinitis 07/19/2017    Oretha Caprice, PT MPT 04/01/2022, 12:47 PM  [facility] [address] [place], [place], [address] Phone: [phone]   Fax:  [phone]  Name: Angelica Norman MRN: [id number] Date of Birth: 1991-03-17     PHYSICAL THERAPY DISCHARGE SUMMARY  Visits from Start of Care: 26  Current functional level related to goals / functional outcomes: See above   Remaining deficits: See above   Education / Equipment: HEP   Patient agrees to discharge. Patient goals were partially met. Patient is being discharged due to a change in medical status.   Laureen Abrahams, PT, DPT 06/05/22 1:59 PM [facility] [address] [place], [place], [address] Phone: [phone]   Fax:  [phone]

## 2022-04-08 ENCOUNTER — Ambulatory Visit (INDEPENDENT_AMBULATORY_CARE_PROVIDER_SITE_OTHER): Payer: Worker's Compensation | Admitting: Orthopedic Surgery

## 2022-04-08 ENCOUNTER — Encounter: Payer: Self-pay | Admitting: Orthopedic Surgery

## 2022-04-08 DIAGNOSIS — S83241D Other tear of medial meniscus, current injury, right knee, subsequent encounter: Secondary | ICD-10-CM | POA: Diagnosis not present

## 2022-04-13 ENCOUNTER — Encounter: Payer: Self-pay | Admitting: Physical Therapy

## 2022-04-13 ENCOUNTER — Ambulatory Visit (INDEPENDENT_AMBULATORY_CARE_PROVIDER_SITE_OTHER): Payer: Worker's Compensation | Admitting: Physical Therapy

## 2022-04-13 DIAGNOSIS — G8929 Other chronic pain: Secondary | ICD-10-CM

## 2022-04-13 DIAGNOSIS — M6281 Muscle weakness (generalized): Secondary | ICD-10-CM

## 2022-04-13 DIAGNOSIS — R262 Difficulty in walking, not elsewhere classified: Secondary | ICD-10-CM

## 2022-04-13 DIAGNOSIS — M25561 Pain in right knee: Secondary | ICD-10-CM

## 2022-04-13 NOTE — Progress Notes (Signed)
? ?Office Visit Note ?  ?Patient: Angelica Norman           ?Date of Birth: Apr 15, 1991           ?MRN: [id number] ?Visit Date: 04/08/2022 ?Requested by: Martinique, Betty G, MD ?[address] ?[place],  [place] [address] ?PCP: Martinique, Betty G, MD ? ?Subjective: ?Chief Complaint  ?Patient presents with  ? Right Knee - Routine Post Op  ? ? ?HPI: Angelica Norman is a 31 year old patient here for follow-up of right knee hyperflexion injury at work 01/15/2022.  Has a meniscal tear on MRI scan.  The lateral open osteochondral defect allograft looks good on MRI scan.  Still has pain on the medial aspect of her knee with every day activities.  Has some clicking as well.  She has been working as an Warden/ranger.  Has been doing therapy once a week.  Has been doing strengthening but not too much weighted movement. ?             ?ROS: All systems reviewed are negative as they relate to the chief complaint within the history of present illness.  Patient denies  fevers or chills. ? ? ?Assessment & Plan: ?Visit Diagnoses:  ?1. Acute medial meniscus tear of right knee, subsequent encounter   ? ? ?Plan: Impression is right knee medial meniscal tear plan is right knee arthroscopy and meniscal debridement.  This looks like it is not the type of tear that can be repaired.  Anticipate more than 50% meniscal volume remaining after resection.  Risk and benefits of the procedure are discussed including not limited to infection nerve vessel damage incomplete pain relief as well as a period of stiffness.  Overall Angelica Norman is a very healthy and fit person.  I think she would recover quickly from this.  Patient understands risk benefits and wishes to proceed.  All questions answered ? ?Follow-Up Instructions: No follow-ups on file.  ? ?Orders:  ?No orders of the defined types were placed in this encounter. ? ?No orders of the defined types were placed in this encounter. ? ? ? ? Procedures: ?No procedures performed ? ? ?Clinical  Data: ?No additional findings. ? ?Objective: ?Vital Signs: There were no vitals taken for this visit. ? ?Physical Exam:  ? ?Constitutional: Patient appears well-developed ?HEENT:  ?Head: Normocephalic ?Eyes:EOM are normal ?Neck: Normal range of motion ?Cardiovascular: Normal rate ?Pulmonary/chest: Effort normal ?Neurologic: Patient is alert ?Skin: Skin is warm ?Psychiatric: Patient has normal mood and affect ? ? ?Ortho Exam: Ortho exam demonstrates full active and passive range of motion of that right knee.  No definite effusion.  There is medial joint line tenderness with positive Murray compression testing.  No clicking or grinding in the knee.  Extensor mechanism intact.  Quad strength excellent. ? ?Specialty Comments:  ?No specialty comments available. ? ?Imaging: ?No results found. ? ? ?PMFS History: ?Patient Active Problem List  ? Diagnosis Date Noted  ? Osteochondritis dissecans   ? Thyroid nodule 07/17/2019  ? Eczema 07/19/2017  ? Allergic rhinitis 07/19/2017  ? ?Past Medical History:  ?Diagnosis Date  ? Abnormal Pap smear of cervix 07/2015  ? Neg/Pos HR HPV--in [place]--no colpo/no treatment  ? Allergy   ? Eczema   ? Family history of adverse reaction to anesthesia   ? "it's hard for my dad to stay asleep during surgery"  ? Thyroid nodule 07/2018  ?  ?Family History  ?Problem Relation Age of Onset  ? Arthritis Mother   ?  Hyperlipidemia Mother   ? Diabetes Father   ? Cancer Father   ?     prostate  ? Thyroid disease Father   ?     hypothyroid  ? Heart disease Maternal Grandmother   ? Hyperlipidemia Maternal Grandmother   ? Stroke Maternal Grandmother   ? Stroke Paternal Grandmother   ? Hypertension Paternal Grandmother   ? Cancer Paternal Grandfather 83  ?     Dec colon cancer  ?  ?Past Surgical History:  ?Procedure Laterality Date  ? HARVEST BONE GRAFT Right 10/16/2021  ? Procedure: HARVEST ILIAC BONE GRAFT;  Surgeon: Meredith Pel, MD;  Location: [facility];  Service: Orthopedics;  Laterality: Right;  ?  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 Procedure: RIGHT KNEE OPEN OSTEOCHONDRAL DEFECT ALLOGRAFT;  Surgeon: Meredith Pel, MD;  Location: [facility];  Service: Orthopedics;  Laterality: Right;  ? TYMPANOSTOMY TUBE PLACEMENT Bilateral   ? ?Social History  ? ?Occupational History  ? Not on file  ?Tobacco Use  ? Smoking status: Never  ? Smokeless tobacco: Never  ?Vaping Use  ? Vaping Use: Never used  ?Substance and Sexual Activity  ? Alcohol use: Yes  ?  Alcohol/week: 3.0 standard drinks  ?  Types: 3 Cans of beer per week  ? Drug use: No  ? Sexual activity: Yes  ?  Partners: Male  ?  Birth control/protection: Implant  ?  Comment: Nexplanon inserted 10-26-14/Lt.arm  ? ? ? ? ? ?

## 2022-04-13 NOTE — Therapy (Signed)
[facility] ?[address] ?[place], [place], [address] ?Phone: [phone]   Fax:  [phone] ? ?Patient Details  ?Name: Angelica Norman ?MRN: [id number] ?Date of Birth: [birth year] ?Referring Provider:  Meredith Pel, MD ? ?Encounter Date: 04/13/2022 ? ?Pt arrived to visit reporting plan is now surgical intervention for torn meniscus.  Discussed current progress and plan, and pt and PT in agreement to hold PT until after surgery if indicated.  No charge for today's visit. ? ? ?Laureen Abrahams, PT, DPT ?04/13/22 8:08 AM ? ? ?[facility] ?[address] ?[place], [place], [address] ?Phone: [phone]   Fax:  [phone] ?

## 2022-04-20 ENCOUNTER — Encounter: Payer: Self-pay | Admitting: Physical Therapy

## 2022-04-27 ENCOUNTER — Encounter: Payer: Self-pay | Admitting: Physical Therapy

## 2022-05-11 ENCOUNTER — Encounter: Payer: Self-pay | Admitting: Family Medicine

## 2022-05-11 ENCOUNTER — Telehealth: Payer: Self-pay

## 2022-05-11 ENCOUNTER — Ambulatory Visit (INDEPENDENT_AMBULATORY_CARE_PROVIDER_SITE_OTHER): Payer: Self-pay | Admitting: Family Medicine

## 2022-05-11 VITALS — BP 118/70 | HR 90 | Temp 98.8°F | Resp 16 | Ht 64.0 in | Wt 155.1 lb

## 2022-05-11 DIAGNOSIS — J069 Acute upper respiratory infection, unspecified: Secondary | ICD-10-CM

## 2022-05-11 DIAGNOSIS — J029 Acute pharyngitis, unspecified: Secondary | ICD-10-CM

## 2022-05-11 LAB — POC COVID19 BINAXNOW: SARS Coronavirus 2 Ag: NEGATIVE

## 2022-05-11 LAB — POCT RAPID STREP A (OFFICE): Rapid Strep A Screen: NEGATIVE

## 2022-05-11 NOTE — Progress Notes (Signed)
ACUTE VISIT Chief Complaint  Patient presents with   Sore Throat    Started Friday night, with post nasal drip & sore throat. Ear pain as well, but that has gotten a little better as the day goes on. Patient does have a surgery scheduled for Thursday.   Nasal Congestion   Otalgia   HPI: Angelica Norman is a 31 y.o. female with history of seasonal allergies and eczema here today complaining of respiratory symptoms as described above.  URI  This is a new problem. The current episode started in the past 7 days. The problem has been gradually improving. There has been no fever. Associated symptoms include congestion, ear pain, joint pain (Right knee, not related to cc), rhinorrhea, sneezing and a sore throat. Pertinent negatives include no abdominal pain, chest pain, coughing, diarrhea, dysuria, headaches, nausea, neck pain, rash, sinus pain, swollen glands, vomiting or wheezing. She has tried increased fluids for the symptoms. The treatment provided mild relief.  Right earache. No changes in hearing or ear drainage. She has tried Vit C and zinc supplement, Airborne. No known sick contact but she works in the hospital with sick pts.  Review of Systems  HENT:  Positive for congestion, ear pain, rhinorrhea, sneezing and sore throat. Negative for sinus pain.   Respiratory:  Negative for cough and wheezing.   Cardiovascular:  Negative for chest pain.  Gastrointestinal:  Negative for abdominal pain, diarrhea, nausea and vomiting.  Genitourinary:  Negative for dysuria.  Musculoskeletal:  Positive for joint pain (Right knee, not related to cc). Negative for neck pain.  Skin:  Negative for rash.  Neurological:  Negative for headaches.  Rest see pertinent positives and negatives per HPI.  Current Outpatient Medications on File Prior to Visit  Medication Sig Dispense Refill   cetirizine (ZYRTEC) 10 MG tablet Take 10 mg by mouth daily.     fluticasone (FLONASE) 50 MCG/ACT nasal  spray USE 1 SPRAY INTO EACH NOSTRIL DAILY. (Patient taking differently: Place 1 spray into both nostrils daily as needed for allergies.) 16 g 2   Ketotifen Fumarate (ALLERGY EYE DROPS OP) Place 1 drop into both eyes daily as needed (allergies).     levonorgestrel (MIRENA) 20 MCG/24HR IUD 1 each by Intrauterine route once.     Melatonin 5 MG CAPS Take 5 mg by mouth at bedtime.     Multiple Vitamin (MULTIVITAMIN WITH MINERALS) TABS tablet Take 1 tablet by mouth daily.     senna (SENOKOT) 8.6 MG tablet Take 1 tablet by mouth daily as needed for constipation.     triamcinolone cream (KENALOG) 0.1 % Apply 1 application topically daily as needed. 45 g 2   No current facility-administered medications on file prior to visit.   Past Medical History:  Diagnosis Date   Abnormal Pap smear of cervix 07/2015   Neg/Pos HR HPV--in [place]--no colpo/no treatment   Allergy    Eczema    Family history of adverse reaction to anesthesia    "it's hard for my dad to stay asleep during surgery"   Thyroid nodule 07/2018   No Known Allergies  Social History   Socioeconomic History   Marital status: Married    Spouse name: Not on file   Number of children: Not on file   Years of education: Not on file   Highest education level: Not on file  Occupational History   Not on file  Tobacco Use   Smoking status: Never   Smokeless tobacco: Never  Vaping Use   Vaping Use: Never used  Substance and Sexual Activity   Alcohol use: Yes    Alcohol/week: 3.0 standard drinks    Types: 3 Cans of beer per week   Drug use: No   Sexual activity: Yes    Partners: Male    Birth control/protection: Implant    Comment: Nexplanon inserted 10-26-14/Lt.arm  Other Topics Concern   Not on file  Social History Narrative   Not on file   Social Determinants of Health   Financial Resource Strain: Not on file  Food Insecurity: Not on file  Transportation Needs: Not on file  Physical Activity: Not on file  Stress: Not  on file  Social Connections: Not on file   Vitals:   05/11/22 1449  BP: 118/70  Pulse: 90  Resp: 16  Temp: 98.8 F (37.1 C)  SpO2: 97%   Body mass index is 26.63 kg/m.  Physical Exam Vitals and nursing note reviewed.  Constitutional:      General: She is not in acute distress.    Appearance: She is well-developed. She is not ill-appearing.  HENT:     Head: Normocephalic and atraumatic.     Right Ear: Ear canal and external ear normal. Tympanic membrane is scarred (chronic).     Left Ear: Ear canal and external ear normal. A middle ear effusion is present.     Nose: No rhinorrhea.     Right Sinus: No maxillary sinus tenderness or frontal sinus tenderness.     Left Sinus: No maxillary sinus tenderness or frontal sinus tenderness.     Mouth/Throat:     Mouth: Mucous membranes are moist.     Pharynx: Posterior oropharyngeal erythema (Mild) present. No pharyngeal swelling, oropharyngeal exudate or uvula swelling.  Eyes:     Conjunctiva/sclera: Conjunctivae normal.  Cardiovascular:     Rate and Rhythm: Normal rate and regular rhythm.     Heart sounds: No murmur heard. Pulmonary:     Effort: Pulmonary effort is normal. No respiratory distress.     Breath sounds: Normal breath sounds. No stridor.  Lymphadenopathy:     Head:     Right side of head: No submandibular adenopathy.     Left side of head: No submandibular adenopathy.     Cervical: Cervical adenopathy (< 1 cm bilateral.) present.  Skin:    General: Skin is warm.     Findings: No erythema or rash.  Neurological:     General: No focal deficit present.     Mental Status: She is alert and oriented to person, place, and time.  Psychiatric:     Comments: Well groomed, good eye contact.   ASSESSMENT AND PLAN:  Angelica Norman was seen today for sore throat, nasal congestion and otalgia.  Diagnoses and all orders for this visit:  URI, acute  Sore throat -     POC COVID-19 -     POC Rapid Strep A  Symptoms suggests a  viral etiology. COVID 19 and rapid strep test negative. Symptoms are improving, we agreed with no ordering strep Cx at this time. I do not think abx is needed. Instructed to monitor for signs of complications, including new onset of fever among some. Because she is planning on having right knee arthroscopy, recommend just Tylenol 500 mg 3-4 times per day if needed and adequate fluid intake. F/U as needed.  Return if symptoms worsen or fail to improve.  Angelica Norman G. [place], MD  [facility]. [place] office.

## 2022-05-11 NOTE — Patient Instructions (Addendum)
A few things to remember from today's visit:  Sore throat - Plan: POC COVID-19, POC Rapid Strep A  URI, acute  If you need refills please call your pharmacy. Do not use My Chart to request refills or for acute issues that need immediate attention.   Seems viral infection,self limited.  Because you are having knee surgery, my only recommendation is Tylenol and fluids. Monitor for new symptoms including fever.  Please be sure medication list is accurate. If a new problem present, please set up appointment sooner than planned today.

## 2022-05-11 NOTE — Telephone Encounter (Signed)
--  Caller states she has nasal congestion, sore throat and right ear pain. She is scheduled for surgery on Thursday. can not find her thermometer but does not think she has a fever. symptoms started on Friday night.   05/11/2022 7:06:24 AM See PCP within 24 Hours Stringer, RN, Dwana Curd  Comments User: Art Buff, RN Date/Time Lamount Cohen Time): 05/11/2022 7:06:39 AM please call pt back regarding appt  Referrals REFERRED TO PCP OFFICE  Pt has appt with PCP today at 3p

## 2022-05-14 ENCOUNTER — Other Ambulatory Visit: Payer: Self-pay | Admitting: Surgical

## 2022-05-14 ENCOUNTER — Encounter: Payer: Self-pay | Admitting: Orthopedic Surgery

## 2022-05-14 DIAGNOSIS — S83241D Other tear of medial meniscus, current injury, right knee, subsequent encounter: Secondary | ICD-10-CM | POA: Diagnosis not present

## 2022-05-14 MED ORDER — ASPIRIN 81 MG PO CHEW: 81.0000 mg | CHEWABLE_TABLET | Freq: Two times a day (BID) | ORAL | 0 refills | Status: AC

## 2022-05-14 MED ORDER — OXYCODONE-ACETAMINOPHEN 5-325 MG PO TABS
1.0000 | ORAL_TABLET | Freq: Four times a day (QID) | ORAL | 0 refills | Status: DC | PRN
Start: 2022-05-14 — End: 2022-12-21

## 2022-05-14 MED ORDER — CELECOXIB 100 MG PO CAPS: 100.0000 mg | ORAL_CAPSULE | Freq: Two times a day (BID) | ORAL | 0 refills | Status: DC

## 2022-05-22 ENCOUNTER — Ambulatory Visit (INDEPENDENT_AMBULATORY_CARE_PROVIDER_SITE_OTHER): Payer: Worker's Compensation | Admitting: Surgical

## 2022-05-22 ENCOUNTER — Encounter: Payer: Self-pay | Admitting: Surgical

## 2022-05-22 DIAGNOSIS — Z9889 Other specified postprocedural states: Secondary | ICD-10-CM

## 2022-05-22 NOTE — Progress Notes (Signed)
Post-Op Visit Note   Patient: Angelica Norman           Date of Birth: Dec 28, 1990           MRN: [id number] Visit Date: 05/22/2022 PCP: [place], Betty G, MD   Assessment & Plan:  Chief Complaint:  Chief Complaint  Patient presents with   Right Knee - Routine Post Op   Visit Diagnoses:  1. Status post arthroscopy of right knee     Plan: Patient is a 31 year old female who presents s/p right knee meniscal debridement on 05/14/2022.  States that she is doing very well and she has really had no pain as long as she does not overdo it.  Symptoms are vastly improved compared with prior to surgery.  No fevers or chills.  No calf pain, chest pain, shortness of breath.  She has been able to ascend and descend stairs without much discomfort.  She did overdo it 1 day but as long as she watches how active she is, her knee pain is very well controlled.  Taking aspirin and Celebrex.  Not taking any opioid medication.  On exam, patient has 0 degrees extension and 120 degrees of knee flexion.  Incisions are healing well with sutures intact.  Sutures removed and replaced with Steri-Strips.  No surrounding erythema or expressible drainage from the incisions.  No calf tenderness.  Negative Homans' sign.  Patient is able to perform straight leg raise without extensor lag with 5/5 quad strength.  She has intact ankle dorsiflexion and plantarflexion.  Ambulates without significant antalgia.  Mild effusion noted.  Plan is to continue with range of motion and strengthening exercises.  Okay for nonloadbearing quadricep strengthening exercises at the gym.  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  Reevaluation by Dr. August Saucer at that time.  Did discuss her arthroscopy pictures today and recommended that in the future she may want to avoid maxing out her weight on power lifting due to the wear and tear that was  evident on arthroscopy.  She understands and agrees with this but still intends on being active and doing some lighter weight lifting.  Follow-up in 4 weeks.  Call in the meantime with any questions.  Follow-Up Instructions: No follow-ups on file.   Orders:  No orders of the defined types were placed in this encounter.  No orders of the defined types were placed in this encounter.   Imaging: No results found.  PMFS History: Patient Active Problem List   Diagnosis Date Noted   Osteochondritis dissecans    Thyroid nodule 07/17/2019   Eczema 07/19/2017   Allergic rhinitis 07/19/2017   Past Medical History:  Diagnosis Date   Abnormal Pap smear of cervix 07/2015   Neg/Pos HR HPV--in [place]--no colpo/no treatment   Allergy    Eczema    Family history of adverse reaction to anesthesia    "it's hard for my dad to stay asleep during surgery"   Thyroid nodule 07/2018    Family History  Problem Relation Age of Onset   Arthritis Mother    Hyperlipidemia Mother    Diabetes Father    Cancer Father        prostate   Thyroid disease Father        hypothyroid   Heart disease Maternal Grandmother    Hyperlipidemia Maternal Grandmother    Stroke Maternal Grandmother  Stroke Paternal Grandmother    Hypertension Paternal Grandmother    Cancer Paternal Grandfather 65       Dec colon cancer    Past Surgical History:  Procedure Laterality Date   HARVEST BONE GRAFT Right 10/16/2021   Procedure: HARVEST ILIAC BONE GRAFT;  Surgeon: Cammy Copa, MD;  Location: [facility] OR;  Service: Orthopedics;  Laterality: Right;   OSTEOCHONDRAL DEFECT REPAIR/RECONSTRUCTION Right 10/16/2021   Procedure: RIGHT KNEE OPEN OSTEOCHONDRAL DEFECT ALLOGRAFT;  Surgeon: Cammy Copa, MD;  Location: [facility] OR;  Service: Orthopedics;  Laterality: Right;   TYMPANOSTOMY TUBE PLACEMENT Bilateral    Social History   Occupational History   Not on file  Tobacco Use   Smoking status: Never   Smokeless  tobacco: Never  Vaping Use   Vaping Use: Never used  Substance and Sexual Activity   Alcohol use: Yes    Alcohol/week: 3.0 standard drinks of alcohol    Types: 3 Cans of beer per week   Drug use: No   Sexual activity: Yes    Partners: Male    Birth control/protection: Implant    Comment: Nexplanon inserted 10-26-14/Lt.arm

## 2022-06-22 ENCOUNTER — Ambulatory Visit (INDEPENDENT_AMBULATORY_CARE_PROVIDER_SITE_OTHER): Payer: Worker's Compensation | Admitting: Orthopedic Surgery

## 2022-06-22 ENCOUNTER — Telehealth: Payer: Self-pay

## 2022-06-22 DIAGNOSIS — Z9889 Other specified postprocedural states: Secondary | ICD-10-CM

## 2022-06-22 NOTE — Telephone Encounter (Signed)
Auth needed from w/c for PT

## 2022-06-23 ENCOUNTER — Telehealth: Payer: Self-pay | Admitting: Orthopedic Surgery

## 2022-06-23 NOTE — Telephone Encounter (Signed)
Received medical records release form from patient/ Forwarding to [employer] today 

## 2022-06-24 ENCOUNTER — Encounter: Payer: Self-pay | Admitting: Orthopedic Surgery

## 2022-06-24 NOTE — Progress Notes (Signed)
Post-Op Visit Note   Patient: Angelica Norman           Date of Birth: July 10, 1991           MRN: [id number] Visit Date: 06/22/2022 PCP: [place], Betty G, MD   Assessment & Plan:  Chief Complaint:  Chief Complaint  Patient presents with   Right Knee - Routine Post Op    right knee meniscal debridement on 05/14/2022   Visit Diagnoses:  1. Status post arthroscopy of right knee     Plan: Angelica Norman is a 31 year old patient who underwent right knee meniscal debridement on 05/14/2022.  Feels well overall.  Does report some increased pain with prolonged sitting.  She has been doing some yoga class.  Having some mild weakness in that right leg.  On examination she has no effusion.  About 1/2 cm of atrophy right thigh versus left.  Range of motion is full.  No joint line tenderness.  Plan at this time is okay to return to work on 06/23/2022 with only minimal to moderate assist with patient's for 3 weeks then regular duty.  Start physical therapy upstairs with [place] 1 time a week for 4 to 6 weeks for strengthening with return office visit in 8 weeks for final rating and release.  Follow-Up Instructions: Return in about 8 weeks (around 08/17/2022).   Orders:  Orders Placed This Encounter  Procedures   Ambulatory referral to Physical Therapy   No orders of the defined types were placed in this encounter.   Imaging: No results found.  PMFS History: Patient Active Problem List   Diagnosis Date Noted   Osteochondritis dissecans    Thyroid nodule 07/17/2019   Eczema 07/19/2017   Allergic rhinitis 07/19/2017   Past Medical History:  Diagnosis Date   Abnormal Pap smear of cervix 07/2015   Neg/Pos HR HPV--in [place]--no colpo/no treatment   Allergy    Eczema    Family history of adverse reaction to anesthesia    "it's hard for my dad to stay asleep during surgery"   Thyroid nodule 07/2018    Family History  Problem Relation Age of Onset   Arthritis Mother     Hyperlipidemia Mother    Diabetes Father    Cancer Father        prostate   Thyroid disease Father        hypothyroid   Heart disease Maternal Grandmother    Hyperlipidemia Maternal Grandmother    Stroke Maternal Grandmother    Stroke Paternal Grandmother    Hypertension Paternal Grandmother    Cancer Paternal Grandfather 65       Dec colon cancer    Past Surgical History:  Procedure Laterality Date   HARVEST BONE GRAFT Right 10/16/2021   Procedure: HARVEST ILIAC BONE GRAFT;  Surgeon: Cammy Copa, MD;  Location: [facility] OR;  Service: Orthopedics;  Laterality: Right;   OSTEOCHONDRAL DEFECT REPAIR/RECONSTRUCTION Right 10/16/2021   Procedure: RIGHT KNEE OPEN OSTEOCHONDRAL DEFECT ALLOGRAFT;  Surgeon: Cammy Copa, MD;  Location: [facility] OR;  Service: Orthopedics;  Laterality: Right;   TYMPANOSTOMY TUBE PLACEMENT Bilateral    Social History   Occupational History   Not on file  Tobacco Use   Smoking status: Never   Smokeless tobacco: Never  Vaping Use   Vaping Use: Never used  Substance and Sexual Activity   Alcohol use: Yes    Alcohol/week: 3.0 standard drinks of alcohol    Types: 3 Cans of beer per week  Drug use: No   Sexual activity: Yes    Partners: Male    Birth control/protection: Implant    Comment: Nexplanon inserted 10-26-14/Lt.arm

## 2022-06-24 NOTE — Therapy (Signed)
OUTPATIENT PHYSICAL THERAPY LOWER EXTREMITY EVALUATION   Patient Name: Angelica Norman MRN: [id number] DOB:1991-08-15, 31 y.o., female Today's Date: 06/25/2022   PT End of Session - 06/25/22 1432     Visit Number 1    Number of Visits 6    Date for PT Re-Evaluation 08/06/22    Authorization Type WC- 6 visits approved    PT Start Time 1430    PT Stop Time 1505    PT Time Calculation (min) 35 min    Activity Tolerance Patient tolerated treatment well    Behavior During Therapy [facility] for tasks assessed/performed             Past Medical History:  Diagnosis Date   Abnormal Pap smear of cervix 07/2015   Neg/Pos HR HPV--in [place]--no colpo/no treatment   Allergy    Eczema    Family history of adverse reaction to anesthesia    "it's hard for my dad to stay asleep during surgery"   Thyroid nodule 07/2018   Past Surgical History:  Procedure Laterality Date   HARVEST BONE GRAFT Right 10/16/2021   Procedure: HARVEST ILIAC BONE GRAFT;  Surgeon: Cammy Copa, MD;  Location: [facility] OR;  Service: Orthopedics;  Laterality: Right;   OSTEOCHONDRAL DEFECT REPAIR/RECONSTRUCTION Right 10/16/2021   Procedure: RIGHT KNEE OPEN OSTEOCHONDRAL DEFECT ALLOGRAFT;  Surgeon: Cammy Copa, MD;  Location: [facility] OR;  Service: Orthopedics;  Laterality: Right;   TYMPANOSTOMY TUBE PLACEMENT Bilateral    Patient Active Problem List   Diagnosis Date Noted   Osteochondritis dissecans    Thyroid nodule 07/17/2019   Eczema 07/19/2017   Allergic rhinitis 07/19/2017    PCP: [place], Betty  REFERRING PROVIDER: Cammy Copa, MD   REFERRING DIAG: [phone] (ICD-10-CM) - Status post arthroscopy of right knee   THERAPY DIAG:  Chronic pain of right knee - Plan: PT plan of care cert/re-cert  Muscle weakness (generalized) - Plan: PT plan of care cert/re-cert  Rationale for Evaluation and Treatment Rehabilitation  ONSET DATE: 05/14/22  SUBJECTIVE:   SUBJECTIVE STATEMENT: Pt is a  31 y/o female who presents to OPPT s/p right knee meniscal debridement on 05/14/2022.  Overall she reports she is doing well and c/o increased tightness and occasional pain.    PERTINENT HISTORY: Osteochondral defect with repair in Nov 2022  PAIN:  Are you having pain? Yes: NPRS scale: 0, up to 4/10 Pain location: Rt knee Pain description: sharp Aggravating factors: transitional movements; pivot or twisting activity Relieving factors: improves as she moves  PRECAUTIONS: Other: limited to mod A patients at work  WEIGHT BEARING RESTRICTIONS No  FALLS:  Has patient fallen in last 6 months? 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)  PLOF: Independent and Leisure: was power lifting - likely will not return  PATIENT GOALS improve strengthening   OBJECTIVE:   DIAGNOSTIC FINDINGS: MRI  PATIENT SURVEYS:  06/25/22: FOTO 64 (predicted 64)  COGNITION: Overall cognitive status: Within functional limits for tasks assessed    EDEMA:  None noted  POSTURE: No Significant postural limitations  LOWER EXTREMITY ROM:  Active ROM Right eval Left eval  Knee flexion 135   Knee extension 5 (hyperextension)    (Blank rows = not tested)  LOWER EXTREMITY MMT:  MMT Right eval Left eval  Knee flexion    Knee extension 85.55#  106.3#   (Blank rows = not tested)   FUNCTIONAL TESTS:  06/25/22:  Y balance test composite score: Rt 93.6% / Lt 96.4%  GAIT: Amb independently without difficulty  TODAY'S TREATMENT: 06/25/22 Discussed current gym program - recommended incorporating more single limb activities to isolate RLE including but not limited to: lateral lunges, curtsey lunges, RDL.  Pt has excellent understanding of strength training at this time.   PATIENT EDUCATION:  Education details:  gym exercises Person educated: Patient Education method: Explanation Education comprehension: verbalized understanding   HOME EXERCISE PROGRAM: See above - discussed gym program deferfed handouts at this time  ASSESSMENT:  CLINICAL IMPRESSION: Patient is a 31 y.o. female who was seen today for physical therapy evaluation and treatment for right knee meniscal debridement on 05/14/2022 . She demonstrates RLE strength deficits affecting functional mobility and return to work at 100%. Will benefit from PT to address deficits.   OBJECTIVE IMPAIRMENTS decreased strength.   ACTIVITY LIMITATIONS lifting, standing, squatting, and locomotion level  PARTICIPATION LIMITATIONS: community activity and occupation  PERSONAL FACTORS Past/current experiences are also affecting patient's functional outcome.   REHAB POTENTIAL: Excellent  CLINICAL DECISION MAKING: Stable/uncomplicated  EVALUATION COMPLEXITY: Low   GOALS: Goals reviewed with patient? Yes  LONG TERM GOALS: Target date: 08/06/2022  Independent with final HEP Goal status: INITIAL  2.  FOTO score at least maintained Goal status: INITIAL  3.  Y balance composite score on Rt improved to at least 95% for improved strength and function Goal status: INIITAL  4.  Report pain < 1/10 with standing and walking for improved function Goal status: INITIAL  5.  Rt knee extension strength improved to at least 90% of Lt knee for improved strength Goal status: INITIAL    PLAN: PT FREQUENCY: 1x/week  PT DURATION: 6 weeks  PLANNED INTERVENTIONS: Therapeutic exercises, Therapeutic activity, Neuromuscular re-education, Patient/Family education, Self Care, Joint mobilization, Dry Needling, Electrical stimulation, Cryotherapy, Moist heat, Taping, Vasopneumatic device, Manual therapy, and Re-evaluation  PLAN FOR NEXT SESSION: progress RLE strength   Clarita Crane, PT, DPT 06/25/22 3:42 PM

## 2022-06-25 ENCOUNTER — Ambulatory Visit (INDEPENDENT_AMBULATORY_CARE_PROVIDER_SITE_OTHER): Payer: Worker's Compensation | Admitting: Physical Therapy

## 2022-06-25 ENCOUNTER — Other Ambulatory Visit: Payer: Self-pay

## 2022-06-25 ENCOUNTER — Encounter: Payer: Self-pay | Admitting: Physical Therapy

## 2022-06-25 DIAGNOSIS — G8929 Other chronic pain: Secondary | ICD-10-CM

## 2022-06-25 DIAGNOSIS — M6281 Muscle weakness (generalized): Secondary | ICD-10-CM

## 2022-06-25 DIAGNOSIS — M25561 Pain in right knee: Secondary | ICD-10-CM | POA: Diagnosis not present

## 2022-07-02 ENCOUNTER — Encounter: Payer: Self-pay | Admitting: Physical Therapy

## 2022-07-08 ENCOUNTER — Encounter: Payer: Self-pay | Admitting: Physical Therapy

## 2022-07-08 ENCOUNTER — Ambulatory Visit (INDEPENDENT_AMBULATORY_CARE_PROVIDER_SITE_OTHER): Payer: Worker's Compensation | Admitting: Physical Therapy

## 2022-07-08 DIAGNOSIS — M6281 Muscle weakness (generalized): Secondary | ICD-10-CM

## 2022-07-08 DIAGNOSIS — R262 Difficulty in walking, not elsewhere classified: Secondary | ICD-10-CM | POA: Diagnosis not present

## 2022-07-08 DIAGNOSIS — M25561 Pain in right knee: Secondary | ICD-10-CM | POA: Diagnosis not present

## 2022-07-08 DIAGNOSIS — G8929 Other chronic pain: Secondary | ICD-10-CM | POA: Diagnosis not present

## 2022-07-08 NOTE — Therapy (Signed)
OUTPATIENT PHYSICAL THERAPY TREATMENT NOTE   Patient Name: Angelica Norman MRN: [id number] DOB:30-Apr-1991, 31 y.o., female Today's Date: 07/08/2022   END OF SESSION:   PT End of Session - 07/08/22 1429     Visit Number 2    Number of Visits 6    Date for PT Re-Evaluation 08/06/22    Authorization Type WC- 6 visits approved    PT Start Time 1428    PT Stop Time 1507    PT Time Calculation (min) 39 min    Activity Tolerance Patient tolerated treatment well    Behavior During Therapy [facility] for tasks assessed/performed             Past Medical History:  Diagnosis Date   Abnormal Pap smear of cervix 07/2015   Neg/Pos HR HPV--in [place]--no colpo/no treatment   Allergy    Eczema    Family history of adverse reaction to anesthesia    "it's hard for my dad to stay asleep during surgery"   Thyroid nodule 07/2018   Past Surgical History:  Procedure Laterality Date   HARVEST BONE GRAFT Right 10/16/2021   Procedure: HARVEST ILIAC BONE GRAFT;  Surgeon: Cammy Copa, MD;  Location: [facility] OR;  Service: Orthopedics;  Laterality: Right;   OSTEOCHONDRAL DEFECT REPAIR/RECONSTRUCTION Right 10/16/2021   Procedure: RIGHT KNEE OPEN OSTEOCHONDRAL DEFECT ALLOGRAFT;  Surgeon: Cammy Copa, MD;  Location: [facility] OR;  Service: Orthopedics;  Laterality: Right;   TYMPANOSTOMY TUBE PLACEMENT Bilateral    Patient Active Problem List   Diagnosis Date Noted   Osteochondritis dissecans    Thyroid nodule 07/17/2019   Eczema 07/19/2017   Allergic rhinitis 07/19/2017     THERAPY DIAG:  Chronic pain of right knee  Muscle weakness (generalized)  Difficulty in walking, not elsewhere classified  PCP: [place], Betty  REFERRING PROVIDER: Cammy Copa, MD   REFERRING DIAG: [phone] (ICD-10-CM) - Status post arthroscopy of right knee   THERAPY DIAG:  Chronic pain of right knee - Plan: PT plan of care cert/re-cert  Muscle weakness (generalized) - Plan: PT plan of care  cert/re-cert  Rationale for Evaluation and Treatment Rehabilitation  ONSET DATE: 05/14/22  SUBJECTIVE:   SUBJECTIVE STATEMENT: Still having some mild pain when getting up after sitting but resolves eventually (up to 30 min).  Has had no episodes today. Doing more single leg activities   PERTINENT HISTORY: Osteochondral defect with repair in Nov 2022  PAIN:  Are you having pain? Yes: NPRS scale: 0, up to 4/10 Pain location: Rt knee Pain description: sharp Aggravating factors: transitional movements; pivot or twisting activity Relieving factors: improves as she moves  PRECAUTIONS: Other: limited to mod A patients at work  PATIENT GOALS improve strengthening   OBJECTIVE:  PATIENT SURVEYS:  06/25/22: FOTO 64 (predicted 64)  LOWER EXTREMITY ROM:  Active ROM Right eval Left eval  Knee flexion 135   Knee extension 5 (hyperextension)    (Blank rows = not tested)  LOWER EXTREMITY MMT:  MMT Right eval Left eval  Knee flexion    Knee extension 85.55# 106.3#   (Blank rows = not tested)   FUNCTIONAL TESTS:  06/25/22: Y balance test composite score: Rt 93.6% / Lt 96.4%  GAIT: Amb independently without difficulty  TODAY'S TREATMENT: 07/08/22 Therex Recumbent bike L17 x 5 min Shuttle jumping bil feet 75# 3x20 sec; then alt Lt/Rt 37# x20 sec, 43# 2x20 sec Walking lunges with trunk rotation and 15# KB 30' x 2 Blaze Pods for quick lateral  shuffle and direction changes 4 x 30 sec cycles with 30 sec rest Skaters 3x20 sec Squat jumps 3x20 sec Skipping 25' x 4 8" box jump 3x10 18" box jump x10 RLE jump onto 2" step x 10 reps    06/25/22 Discussed current gym program - recommended incorporating more single limb activities to isolate RLE including but not limited to: lateral lunges, curtsey lunges, RDL.  Pt has excellent understanding of strength training at this time.   PATIENT EDUCATION:  Education details: gym exercises Person educated: Patient Education method:  Explanation Education comprehension: verbalized understanding   HOME EXERCISE PROGRAM: See above - discussed gym program deferfed handouts at this time  ASSESSMENT:  CLINICAL IMPRESSION: Mild compensations noted with bil LE exercises.  Recommend isolating RLE as much as possible with exercises.  Plyometrics and lateral movements initiated today with positive response.  Will continue to benefit from PT to maximize function.   OBJECTIVE IMPAIRMENTS decreased strength.   ACTIVITY LIMITATIONS lifting, standing, squatting, and locomotion level  PARTICIPATION LIMITATIONS: community activity and occupation  PERSONAL FACTORS Past/current experiences are also affecting patient's functional outcome.   REHAB POTENTIAL: Excellent  CLINICAL DECISION MAKING: Stable/uncomplicated  EVALUATION COMPLEXITY: Low   GOALS: Goals reviewed with patient? Yes  LONG TERM GOALS: Target date: 08/06/2022  Independent with final HEP Goal status: INITIAL  2.  FOTO score at least maintained Goal status: INITIAL  3.  Y balance composite score on Rt improved to at least 95% for improved strength and function Goal status: INIITAL  4.  Report pain < 1/10 with standing and walking for improved function Goal status: INITIAL  5.  Rt knee extension strength improved to at least 90% of Lt knee for improved strength Goal status: INITIAL    PLAN: PT FREQUENCY: 1x/week  PT DURATION: 6 weeks  PLANNED INTERVENTIONS: Therapeutic exercises, Therapeutic activity, Neuromuscular re-education, Patient/Family education, Self Care, Joint mobilization, Dry Needling, Electrical stimulation, Cryotherapy, Moist heat, Taping, Vasopneumatic device, Manual therapy, and Re-evaluation  PLAN FOR NEXT SESSION: progress RLE strength, plyometrics, single limb activities    Clarita Crane, PT, DPT 07/08/22 3:09 PM

## 2022-07-09 ENCOUNTER — Other Ambulatory Visit: Payer: Self-pay | Admitting: Surgical

## 2022-07-16 ENCOUNTER — Ambulatory Visit (INDEPENDENT_AMBULATORY_CARE_PROVIDER_SITE_OTHER): Payer: Worker's Compensation | Admitting: Physical Therapy

## 2022-07-16 ENCOUNTER — Encounter: Payer: Self-pay | Admitting: Physical Therapy

## 2022-07-16 DIAGNOSIS — G8929 Other chronic pain: Secondary | ICD-10-CM

## 2022-07-16 DIAGNOSIS — R262 Difficulty in walking, not elsewhere classified: Secondary | ICD-10-CM | POA: Diagnosis not present

## 2022-07-16 DIAGNOSIS — M25561 Pain in right knee: Secondary | ICD-10-CM | POA: Diagnosis not present

## 2022-07-16 DIAGNOSIS — M6281 Muscle weakness (generalized): Secondary | ICD-10-CM | POA: Diagnosis not present

## 2022-07-16 DIAGNOSIS — R252 Cramp and spasm: Secondary | ICD-10-CM | POA: Diagnosis not present

## 2022-07-16 NOTE — Therapy (Signed)
OUTPATIENT PHYSICAL THERAPY TREATMENT NOTE   Patient Name: Angelica Norman MRN: [id number] DOB:05-28-1991, 31 y.o., female Today's Date: 07/16/2022   END OF SESSION:   PT End of Session - 07/16/22 1303     Visit Number 3    Number of Visits 6    Date for PT Re-Evaluation 08/06/22    Authorization Type WC- 6 visits approved    PT Start Time 1301    PT Stop Time 1335    PT Time Calculation (min) 34 min    Activity Tolerance Patient tolerated treatment well    Behavior During Therapy [facility] for tasks assessed/performed              Past Medical History:  Diagnosis Date   Abnormal Pap smear of cervix 07/2015   Neg/Pos HR HPV--in [place]--no colpo/no treatment   Allergy    Eczema    Family history of adverse reaction to anesthesia    "it's hard for my dad to stay asleep during surgery"   Thyroid nodule 07/2018   Past Surgical History:  Procedure Laterality Date   HARVEST BONE GRAFT Right 10/16/2021   Procedure: HARVEST ILIAC BONE GRAFT;  Surgeon: Cammy Copa, MD;  Location: [facility] OR;  Service: Orthopedics;  Laterality: Right;   OSTEOCHONDRAL DEFECT REPAIR/RECONSTRUCTION Right 10/16/2021   Procedure: RIGHT KNEE OPEN OSTEOCHONDRAL DEFECT ALLOGRAFT;  Surgeon: Cammy Copa, MD;  Location: [facility] OR;  Service: Orthopedics;  Laterality: Right;   TYMPANOSTOMY TUBE PLACEMENT Bilateral    Patient Active Problem List   Diagnosis Date Noted   Osteochondritis dissecans    Thyroid nodule 07/17/2019   Eczema 07/19/2017   Allergic rhinitis 07/19/2017     THERAPY DIAG:  Chronic pain of right knee  Muscle weakness (generalized)  Difficulty in walking, not elsewhere classified  Cramp and spasm  PCP: [place], Betty  REFERRING PROVIDER: Cammy Copa, MD   REFERRING DIAG: [phone] (ICD-10-CM) - Status post arthroscopy of right knee   Rationale for Evaluation and Treatment Rehabilitation  ONSET DATE: 05/14/22  SUBJECTIVE:   SUBJECTIVE  STATEMENT: Doing more plyometrics and no increase in pain; still having pain after prolonged sitting that lasts about 30 min.  PERTINENT HISTORY: Osteochondral defect with repair in Nov 2022  PAIN:  Are you having pain? Yes: NPRS scale: 0, up to 4/10 Pain location: Rt knee Pain description: sharp Aggravating factors: transitional movements; pivot or twisting activity Relieving factors: improves as she moves  PRECAUTIONS: Other: limited to mod A patients at work  PATIENT GOALS improve strengthening   OBJECTIVE:  PATIENT SURVEYS:  06/25/22: FOTO 64 (predicted 64)  LOWER EXTREMITY ROM:  Active ROM Right eval Left eval  Knee flexion 135   Knee extension 5 (hyperextension)    (Blank rows = not tested)  LOWER EXTREMITY MMT:  MMT Right eval Left eval  Knee flexion    Knee extension 85.55# 106.3#   (Blank rows = not tested)   FUNCTIONAL TESTS:  06/25/22: Y balance test composite score: Rt 93.6% / Lt 96.4%  GAIT: Amb independently without difficulty  TODAY'S TREATMENT: 07/16/22 Therex Recumbent bike L17 x 5 min Shuttle jumping bil feet 75# 3x20 sec; then alt Lt/Rt 37# 2x30 sec; RLE only 31# 2x30 sec Blaze Pods for quick lateral shuffle and direction changes 4 x 30 sec cycles with 30 sec rest Walking lunges with trunk rotation and 20# KB 30' x 2 Jumping lunges 3 sets; x 5 bil Forward/backward jogging then jogging with 180 deg pivot; with direction  changes with RLE; delayed with slight hesitation noted   07/08/22 Therex Recumbent bike L17 x 5 min Shuttle jumping bil feet 75# 3x20 sec; then alt Lt/Rt 37# x20 sec, 43# 2x20 sec Walking lunges with trunk rotation and 15# KB 30' x 2 Blaze Pods for quick lateral shuffle and direction changes 4 x 30 sec cycles with 30 sec rest Skaters 3x20 sec Squat jumps 3x20 sec Skipping 25' x 4 8" box jump 3x10 18" box jump x10 RLE jump onto 2" step x 10 reps    06/25/22 Discussed current gym program - recommended incorporating  more single limb activities to isolate RLE including but not limited to: lateral lunges, curtsey lunges, RDL.  Pt has excellent understanding of strength training at this time.   PATIENT EDUCATION:  Education details: gym exercises Person educated: Patient Education method: Explanation Education comprehension: verbalized understanding   HOME EXERCISE PROGRAM: See above - discussed gym program deferfed handouts at this time  ASSESSMENT:  CLINICAL IMPRESSION: Continues to demonstrate good tolerance to activity with c/o fluid and pressure with activities but no pain.  Overall doing well.  May consider d/c next visit depending on progress and symptoms.   OBJECTIVE IMPAIRMENTS decreased strength.   ACTIVITY LIMITATIONS lifting, standing, squatting, and locomotion level  PARTICIPATION LIMITATIONS: community activity and occupation  PERSONAL FACTORS Past/current experiences are also affecting patient's functional outcome.   REHAB POTENTIAL: Excellent  CLINICAL DECISION MAKING: Stable/uncomplicated  EVALUATION COMPLEXITY: Low   GOALS: Goals reviewed with patient? Yes  LONG TERM GOALS: Target date: 08/06/2022  Independent with final HEP Goal status: INITIAL  2.  FOTO score at least maintained Goal status: INITIAL  3.  Y balance composite score on Rt improved to at least 95% for improved strength and function Goal status: INIITAL  4.  Report pain < 1/10 with standing and walking for improved function Goal status: INITIAL  5.  Rt knee extension strength improved to at least 90% of Lt knee for improved strength Goal status: INITIAL    PLAN: PT FREQUENCY: 1x/week  PT DURATION: 6 weeks  PLANNED INTERVENTIONS: Therapeutic exercises, Therapeutic activity, Neuromuscular re-education, Patient/Family education, Self Care, Joint mobilization, Dry Needling, Electrical stimulation, Cryotherapy, Moist heat, Taping, Vasopneumatic device, Manual therapy, and Re-evaluation  PLAN  FOR NEXT SESSION: consider quad DN, progress RLE strength, plyometrics, single limb activities    Clarita Crane, PT, DPT 07/16/22 1:39 PM

## 2022-07-21 ENCOUNTER — Encounter: Payer: Self-pay | Admitting: Family Medicine

## 2022-07-21 DIAGNOSIS — H539 Unspecified visual disturbance: Secondary | ICD-10-CM

## 2022-07-23 ENCOUNTER — Encounter: Payer: Self-pay | Admitting: Physical Therapy

## 2022-07-23 ENCOUNTER — Ambulatory Visit (INDEPENDENT_AMBULATORY_CARE_PROVIDER_SITE_OTHER): Payer: Self-pay | Admitting: Physical Therapy

## 2022-07-23 DIAGNOSIS — M6281 Muscle weakness (generalized): Secondary | ICD-10-CM

## 2022-07-23 DIAGNOSIS — R262 Difficulty in walking, not elsewhere classified: Secondary | ICD-10-CM

## 2022-07-23 DIAGNOSIS — R252 Cramp and spasm: Secondary | ICD-10-CM

## 2022-07-23 DIAGNOSIS — M25561 Pain in right knee: Secondary | ICD-10-CM

## 2022-07-23 DIAGNOSIS — G8929 Other chronic pain: Secondary | ICD-10-CM

## 2022-07-23 NOTE — Therapy (Signed)
OUTPATIENT PHYSICAL THERAPY TREATMENT NOTE DISCHARGE SUMMARY   Patient Name: Angelica Norman MRN: [id number] DOB:1991/03/05, 31 y.o., female Today's Date: 07/23/2022   END OF SESSION:   PT End of Session - 07/23/22 1344     Visit Number 4    Number of Visits 6    Date for PT Re-Evaluation 08/06/22    Authorization Type WC- 6 visits approved    PT Start Time 3614    PT Stop Time 1420    PT Time Calculation (min) 36 min    Activity Tolerance Patient tolerated treatment well    Behavior During Therapy [facility] for tasks assessed/performed               Past Medical History:  Diagnosis Date   Abnormal Pap smear of cervix 07/2015   Neg/Pos HR HPV--in [place]--no colpo/no treatment   Allergy    Eczema    Family history of adverse reaction to anesthesia    "it's hard for my dad to stay asleep during surgery"   Thyroid nodule 07/2018   Past Surgical History:  Procedure Laterality Date   HARVEST BONE GRAFT Right 10/16/2021   Procedure: HARVEST ILIAC BONE GRAFT;  Surgeon: Meredith Pel, MD;  Location: [place];  Service: Orthopedics;  Laterality: Right;   OSTEOCHONDRAL DEFECT REPAIR/RECONSTRUCTION Right 10/16/2021   Procedure: RIGHT KNEE OPEN OSTEOCHONDRAL DEFECT ALLOGRAFT;  Surgeon: Meredith Pel, MD;  Location: [place];  Service: Orthopedics;  Laterality: Right;   TYMPANOSTOMY TUBE PLACEMENT Bilateral    Patient Active Problem List   Diagnosis Date Noted   Osteochondritis dissecans    Thyroid nodule 07/17/2019   Eczema 07/19/2017   Allergic rhinitis 07/19/2017     THERAPY DIAG:  Chronic pain of right knee  Difficulty in walking, not elsewhere classified  Muscle weakness (generalized)  Cramp and spasm  PCP: [place], Betty  REFERRING PROVIDER: Meredith Pel, MD   REFERRING DIAG: [phone] (ICD-10-CM) - Status post arthroscopy of right knee   Rationale for Evaluation and Treatment Rehabilitation  ONSET DATE: 05/14/22  SUBJECTIVE:    SUBJECTIVE STATEMENT: Pain is less frequent and less duration at this time, reports 0-1/10  PERTINENT HISTORY: Osteochondral defect with repair in Nov 2022  PAIN:  Are you having pain? 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  PATIENT GOALS improve strengthening   OBJECTIVE:  PATIENT SURVEYS:  06/25/22: FOTO 64 (predicted 64) 07/23/22: FOTO 97  LOWER EXTREMITY ROM:  Active ROM Right eval Left eval  Knee flexion 135   Knee extension 5 (hyperextension)    (Blank rows = not tested)  LOWER EXTREMITY MMT:  MMT Right eval Left eval Right   Knee flexion     Knee extension 85.55# 106.3# 106.05#   (Blank rows = not tested)   FUNCTIONAL TESTS:  06/25/22: Y balance test composite score: Rt 93.6% / Lt 96.4% 07/23/22: Y balance test composite score: Rt 101.9% / Lt 102%  GAIT: Amb independently without difficulty  TODAY'S TREATMENT: 07/23/22 Physical Performance Test: Y balance test performed - see results above  Manual: STM with compression to Rt medial quad; skilled palpation and monitoring of soft tissue during DN  Trigger Point Dry-Needling  Treatment instructions: Expect mild to moderate muscle soreness. S/S of pneumothorax if dry needled over a lung field, and to seek immediate medical attention should they  occur. Patient verbalized understanding of these instructions and education.  Patient Consent Given: Yes Education handout provided: Previously provided Muscles treated: Rt medial quad Electrical stimulation performed: Yes Parameters:  to tolerance x 5 min, freq 80, up to 4 mA Treatment response/outcome: twitch responses noted with decreased tenderness after  Therex: Dynamometry measurements as noted above    07/16/22 Therex Recumbent bike L17 x 5  min Shuttle jumping bil feet 75# 3x20 sec; then alt Lt/Rt 37# 2x30 sec; RLE only 31# 2x30 sec Blaze Pods for quick lateral shuffle and direction changes 4 x 30 sec cycles with 30 sec rest Walking lunges with trunk rotation and 20# KB 30' x 2 Jumping lunges 3 sets; x 5 bil Forward/backward jogging then jogging with 180 deg pivot; with direction changes with RLE; delayed with slight hesitation noted   07/08/22 Therex Recumbent bike L17 x 5 min Shuttle jumping bil feet 75# 3x20 sec; then alt Lt/Rt 37# x20 sec, 43# 2x20 sec Walking lunges with trunk rotation and 15# KB 30' x 2 Blaze Pods for quick lateral shuffle and direction changes 4 x 30 sec cycles with 30 sec rest Skaters 3x20 sec Squat jumps 3x20 sec Skipping 25' x 4 8" box jump 3x10 18" box jump x10 RLE jump onto 2" step x 10 reps    06/25/22 Discussed current gym program - recommended incorporating more single limb activities to isolate RLE including but not limited to: lateral lunges, curtsey lunges, RDL.  Pt has excellent understanding of strength training at this time.   PATIENT EDUCATION:  Education details: gym exercises Person educated: Patient Education method: Explanation Education comprehension: verbalized understanding   HOME EXERCISE PROGRAM: See above - discussed gym program deferfed handouts at this time  ASSESSMENT:  CLINICAL IMPRESSION: Pt has met all goals and is ready for d/c today from PT.  Will d/c PT at this time.  OBJECTIVE IMPAIRMENTS decreased strength.   ACTIVITY LIMITATIONS lifting, standing, squatting, and locomotion level  PARTICIPATION LIMITATIONS: community activity and occupation  PERSONAL FACTORS Past/current experiences are also affecting patient's functional outcome.   REHAB POTENTIAL: Excellent  CLINICAL DECISION MAKING: Stable/uncomplicated  EVALUATION COMPLEXITY: Low   GOALS: Goals reviewed with patient? Yes  LONG TERM GOALS: Target date: 08/06/2022  Independent with  final HEP Goal status: MET 07/23/22  2.  FOTO score at least maintained Goal status: MET 07/23/22  3.  Y balance composite score on Rt improved to at least 95% for improved strength and function Goal status: MET 07/23/22  4.  Report pain < 1/10 with standing and walking for improved function Goal status: MET 07/23/22  5.  Rt knee extension strength improved to at least 90% of Lt knee for improved strength Goal status: MET 07/23/22    PLAN: PT FREQUENCY: 1x/week  PT DURATION: 6 weeks  PLANNED INTERVENTIONS: Therapeutic exercises, Therapeutic activity, Neuromuscular re-education, Patient/Family education, Self Care, Joint mobilization, Dry Needling, Electrical stimulation, Cryotherapy, Moist heat, Taping, Vasopneumatic device, Manual therapy, and Re-evaluation  PLAN FOR NEXT SESSION: D/C pt TODAY   Laureen Abrahams, PT, DPT 07/23/22 2:26 PM    PHYSICAL THERAPY DISCHARGE SUMMARY  Visits from Start of Care: 4  Current functional level related to goals / functional outcomes: See above   Remaining deficits: N/a   Education / Equipment: HEP, DN   Patient agrees to discharge. Patient goals were met. Patient is being discharged due to meeting the stated rehab goals.  Laureen Abrahams, PT, DPT 07/23/22 2:26 PM  [facility] [address] [place], [place], [address] Phone: [phone]   Fax:  [phone]

## 2022-08-19 ENCOUNTER — Ambulatory Visit (INDEPENDENT_AMBULATORY_CARE_PROVIDER_SITE_OTHER): Payer: Worker's Compensation | Admitting: Orthopedic Surgery

## 2022-08-19 DIAGNOSIS — Z9889 Other specified postprocedural states: Secondary | ICD-10-CM | POA: Diagnosis not present

## 2022-08-25 ENCOUNTER — Encounter: Payer: Self-pay | Admitting: Orthopedic Surgery

## 2022-08-25 NOTE — Progress Notes (Signed)
Post-Op Visit Note   Patient: Angelica Norman           Date of Birth: 10-Dec-1990           MRN: [id number] Visit Date: 08/19/2022 PCP: [place], Betty G, MD   Assessment & Plan:  Chief Complaint:  Chief Complaint  Patient presents with   Right Knee - Follow-up   Visit Diagnoses:  1. Status post arthroscopy of right knee     Plan: Angelica Norman is a 31 year old patient who is about 3 months out right knee meniscal debridement.  Patient is working.  She is doing yoga.  Doing weight lifting but with less weight.  Has some occasional morning pain.  Would like to get back to rowing.  Not taking any medication.  On examination she has good quad strength with good range of motion and no joint line tenderness.  No effusion in the right knee.  Impression is patient is doing well following her meniscal debridement.  She still needs to be careful moving forward with the lack of part of her cushion as well as allograft in that knee.  She is rated at 10% of the right knee and released to activity as tolerated.  Strongly discouraged her from heavy weight lifting as well as running.  From a work perspective she is okay to resume regular duty.  Follow-Up Instructions: No follow-ups on file.   Orders:  No orders of the defined types were placed in this encounter.  No orders of the defined types were placed in this encounter.   Imaging: No results found.  PMFS History: Patient Active Problem List   Diagnosis Date Noted   Osteochondritis dissecans    Thyroid nodule 07/17/2019   Eczema 07/19/2017   Allergic rhinitis 07/19/2017   Past Medical History:  Diagnosis Date   Abnormal Pap smear of cervix 07/2015   Neg/Pos HR HPV--in [place]--no colpo/no treatment   Allergy    Eczema    Family history of adverse reaction to anesthesia    "it's hard for my dad to stay asleep during surgery"   Thyroid nodule 07/2018    Family History  Problem Relation Age of Onset   Arthritis  Mother    Hyperlipidemia Mother    Diabetes Father    Cancer Father        prostate   Thyroid disease Father        hypothyroid   Heart disease Maternal Grandmother    Hyperlipidemia Maternal Grandmother    Stroke Maternal Grandmother    Stroke Paternal Grandmother    Hypertension Paternal Grandmother    Cancer Paternal Grandfather 65       Dec colon cancer    Past Surgical History:  Procedure Laterality Date   HARVEST BONE GRAFT Right 10/16/2021   Procedure: HARVEST ILIAC BONE GRAFT;  Surgeon: Meredith Pel, MD;  Location: [place];  Service: Orthopedics;  Laterality: Right;   OSTEOCHONDRAL DEFECT REPAIR/RECONSTRUCTION Right 10/16/2021   Procedure: RIGHT KNEE OPEN OSTEOCHONDRAL DEFECT ALLOGRAFT;  Surgeon: Meredith Pel, MD;  Location: [place];  Service: Orthopedics;  Laterality: Right;   TYMPANOSTOMY TUBE PLACEMENT Bilateral    Social History   Occupational History   Not on file  Tobacco Use   Smoking status: Never   Smokeless tobacco: Never  Vaping Use   Vaping Use: Never used  Substance and Sexual Activity   Alcohol use: Yes    Alcohol/week: 3.0 standard drinks of alcohol    Types: 3 Cans  of beer per week   Drug use: No   Sexual activity: Yes    Partners: Male    Birth control/protection: Implant    Comment: Nexplanon inserted 10-26-14/Lt.arm

## 2022-12-18 NOTE — Progress Notes (Unsigned)
HPI: Angelica Norman is a 32 y.o. female, who is here today for her routine physical. Last CPE: 11/24/21  She exercises three to four times per week, engaging in weightlifting and yoga. She maintains a healthy diet, consuming vegetables daily, and sleeps an average of seven to eight hours per night. She denies smoking and consuming excessive alcohol.  Immunization History  Administered Date(s) Administered   Tdap 07/07/2016   Health Maintenance  Topic Date Due   PAP-Cervical Cytology Screening  08/10/2021   PAP SMEAR-Modifier  08/10/2021   INFLUENZA VACCINE  03/07/2023 (Originally 07/07/2022)   DTaP/Tdap/Td (2 - Td or Tdap) 07/07/2026   Hepatitis C Screening  Completed   HIV Screening  Completed   HPV VACCINES  Aged Out   Her IUD placed at the end of 2020 and has not been able to see a gynecologist regularly. She experiences light menstrual periods, with the last one occurring approximately 10 days ago.  She reports experiencing wheezing for the past two to three months, occurring two to three times per week. She notices the wheezing after exercise and sometimes throughout the day. She has a family history of asthma but has never been diagnosed with it herself.   She has been taking Zyrtec and Flonase as needed for allergies.  The wheezing is not accompanied by cough or difficulty breathing.  She needs biometric screening done today for insurance purposes.  Review of Systems  Constitutional:  Negative for activity change, appetite change and fever.  HENT:  Negative for hearing loss, mouth sores, sore throat and trouble swallowing.   Eyes:  Negative for redness and visual disturbance.  Respiratory:  Positive for wheezing. Negative for cough and shortness of breath.   Cardiovascular:  Negative for chest pain and leg swelling.  Gastrointestinal:  Negative for abdominal pain, nausea and vomiting.       No changes in bowel habits.  Endocrine: Negative for cold  intolerance, heat intolerance, polydipsia, polyphagia and polyuria.  Genitourinary:  Negative for decreased urine volume, dysuria, hematuria, vaginal bleeding and vaginal discharge.  Musculoskeletal:  Negative for gait problem and myalgias.  Skin:  Negative for color change and rash.  Allergic/Immunologic: Positive for environmental allergies.  Neurological:  Negative for seizures, syncope, weakness and headaches.  Hematological:  Negative for adenopathy. Does not bruise/bleed easily.  Psychiatric/Behavioral:  Negative for confusion. The patient is not nervous/anxious.   All other systems reviewed and are negative.  Current Outpatient Medications on File Prior to Visit  Medication Sig Dispense Refill   cetirizine (ZYRTEC) 10 MG tablet Take 10 mg by mouth daily.     fluticasone (FLONASE) 50 MCG/ACT nasal spray USE 1 SPRAY INTO EACH NOSTRIL DAILY. (Patient taking differently: Place 1 spray into both nostrils daily as needed for allergies.) 16 g 2   GLUCOSAMINE-CHONDROITIN PO Take by mouth.     Ketotifen Fumarate (ALLERGY EYE DROPS OP) Place 1 drop into both eyes daily as needed (allergies).     levonorgestrel (MIRENA) 20 MCG/24HR IUD 1 each by Intrauterine route once.     Melatonin 5 MG CAPS Take 5 mg by mouth at bedtime.     Multiple Vitamin (MULTIVITAMIN WITH MINERALS) TABS tablet Take 1 tablet by mouth daily.     triamcinolone cream (KENALOG) 0.1 % Apply 1 application topically daily as needed. 45 g 2   No current facility-administered medications on file prior to visit.   Past Medical History:  Diagnosis Date   Abnormal Pap smear of cervix 07/2015  Neg/Pos HR HPV--in [place]--no colpo/no treatment   Allergy    Eczema    Family history of adverse reaction to anesthesia    "it's hard for my dad to stay asleep during surgery"   Thyroid nodule 07/2018   Past Surgical History:  Procedure Laterality Date   HARVEST BONE GRAFT Right 10/16/2021   Procedure: HARVEST ILIAC BONE GRAFT;   Surgeon: Cammy Copa, MD;  Location: [facility] OR;  Service: Orthopedics;  Laterality: Right;   OSTEOCHONDRAL DEFECT REPAIR/RECONSTRUCTION Right 10/16/2021   Procedure: RIGHT KNEE OPEN OSTEOCHONDRAL DEFECT ALLOGRAFT;  Surgeon: Cammy Copa, MD;  Location: [facility] OR;  Service: Orthopedics;  Laterality: Right;   TYMPANOSTOMY TUBE PLACEMENT Bilateral    No Known Allergies  Family History  Problem Relation Age of Onset   Arthritis Mother    Hyperlipidemia Mother    Diabetes Father    Cancer Father        prostate   Thyroid disease Father        hypothyroid   Heart disease Maternal Grandmother    Hyperlipidemia Maternal Grandmother    Stroke Maternal Grandmother    Stroke Paternal Grandmother    Hypertension Paternal Grandmother    Cancer Paternal Grandfather 19       Dec colon cancer   Social History   Socioeconomic History   Marital status: Married    Spouse name: Not on file   Number of children: Not on file   Years of education: Not on file   Highest education level: Not on file  Occupational History   Not on file  Tobacco Use   Smoking status: Never   Smokeless tobacco: Never  Vaping Use   Vaping Use: Never used  Substance and Sexual Activity   Alcohol use: Yes    Alcohol/week: 3.0 standard drinks of alcohol    Types: 3 Cans of beer per week   Drug use: No   Sexual activity: Yes    Partners: Male    Birth control/protection: Implant    Comment: Nexplanon inserted 10-26-14/Lt.arm  Other Topics Concern   Not on file  Social History Narrative   Not on file   Social Determinants of Health   Financial Resource Strain: Not on file  Food Insecurity: Not on file  Transportation Needs: Not on file  Physical Activity: Not on file  Stress: Not on file  Social Connections: Not on file   Vitals:   12/21/22 1228  BP: 120/70  Pulse: 90  Resp: 12  Temp: 98.2 F (36.8 C)  SpO2: 98%   Body mass index is 26.65 kg/m.  Wt Readings from Last 3 Encounters:   12/21/22 155 lb 4 oz (70.4 kg)  05/11/22 155 lb 2 oz (70.4 kg)  11/24/21 145 lb (65.8 kg)   Physical Exam Vitals and nursing note reviewed. Exam conducted with a chaperone present.  Constitutional:      General: She is not in acute distress.    Appearance: She is well-developed.  HENT:     Head: Normocephalic and atraumatic.     Right Ear: Ear canal and external ear normal. Tympanic membrane is scarred.     Left Ear: Tympanic membrane, ear canal and external ear normal.     Mouth/Throat:     Mouth: Mucous membranes are moist.     Pharynx: Oropharynx is clear. Uvula midline.  Eyes:     Extraocular Movements: Extraocular movements intact.     Conjunctiva/sclera: Conjunctivae normal.     Pupils: Pupils  are equal, round, and reactive to light.  Neck:     Thyroid: No thyroid mass.  Cardiovascular:     Rate and Rhythm: Normal rate and regular rhythm.     Pulses:          Dorsalis pedis pulses are 2+ on the right side and 2+ on the left side.     Heart sounds: No murmur heard. Pulmonary:     Effort: Pulmonary effort is normal. No respiratory distress.     Breath sounds: Normal breath sounds.  Abdominal:     Palpations: Abdomen is soft. There is no hepatomegaly or mass.     Tenderness: There is no abdominal tenderness.  Genitourinary:    Exam position: Lithotomy position.     Labia:        Right: No rash, tenderness or lesion.        Left: No rash, tenderness or lesion.      Vagina: No signs of injury and foreign body. No vaginal discharge, erythema, tenderness, bleeding or lesions.     Cervix: No cervical motion tenderness, discharge, friability, lesion, erythema or cervical bleeding.     Uterus: Not enlarged and not tender.      Adnexa:        Right: No mass, tenderness or fullness.         Left: No mass, tenderness or fullness.       Comments: IUD strings visible. Pap smear collected. Musculoskeletal:     Right lower leg: No edema.     Left lower leg: No edema.      Comments: No major deformity or signs of synovitis appreciated.   Lymphadenopathy:     Cervical: No cervical adenopathy.     Upper Body:     Right upper body: No supraclavicular adenopathy.     Left upper body: No supraclavicular adenopathy.  Skin:    General: Skin is warm.     Findings: No erythema or rash.  Neurological:     General: No focal deficit present.     Mental Status: She is alert and oriented to person, place, and time.     Cranial Nerves: No cranial nerve deficit.     Coordination: Coordination normal.     Gait: Gait normal.     Deep Tendon Reflexes:     Reflex Scores:      Bicep reflexes are 2+ on the right side and 2+ on the left side.      Patellar reflexes are 2+ on the right side and 2+ on the left side. Psychiatric:        Mood and Affect: Mood and affect normal.   ASSESSMENT AND PLAN: Angelica Norman was here today annual physical examination.  Orders Placed This Encounter  Procedures   DG Chest 2 View   Basic metabolic panel   Lipid panel   Lab Results  Component Value Date   CHOL 166 12/21/2022   HDL 53.60 12/21/2022   LDLCALC 99 12/21/2022   TRIG 68.0 12/21/2022   CHOLHDL 3 12/21/2022   Lab Results  Component Value Date   CREATININE 0.65 12/21/2022   BUN 14 12/21/2022   NA 137 12/21/2022   K 3.8 12/21/2022   CL 101 12/21/2022   CO2 28 12/21/2022   Routine general medical examination at a health care facility Assessment & Plan: We discussed the importance of regular physical activity and healthy diet for prevention of chronic illness and/or complications. Preventive guidelines reviewed. Vaccination up  to date. Pap collected today. She is planning on establishing with gyn but the time IUD needs to be changed. Next CPE in a year.   Screening for lipoid disorders -     Lipid panel; Future  Screening for endocrine, metabolic and immunity disorder -     Basic metabolic panel; Future  Screening for cervical cancer -      Cytology - PAP  Wheezing Assessment & Plan: We discussed possible etiologies. ? Asthma. Options: Pulmonology referral, PFT's,or empiric treatment for possible asthma, she prefers to start with the latter one. Flovent 44 mcg 2 puff bid. Swish after use. Albuterol inh 2 puff qid prn. CXR ordered. F/U in 3 months, before if needed.  Orders: -     Fluticasone Propionate HFA; Inhale 2 puffs into the lungs 2 (two) times daily.  Dispense: 1 each; Refill: 3 -     Albuterol Sulfate HFA; Inhale 2 puffs into the lungs every 6 (six) hours as needed for wheezing or shortness of breath.  Dispense: 8 g; Refill: 0 -     DG Chest 2 View; Future  Encounter for biometric screening -     Basic metabolic panel; Future -     Lipid panel; Future  Return in 3 months (on 03/22/2023) for wheezing.  Lareta Bruneau G. [place], MD  [facility]. [place] office.

## 2022-12-21 ENCOUNTER — Ambulatory Visit (INDEPENDENT_AMBULATORY_CARE_PROVIDER_SITE_OTHER): Payer: 59 | Admitting: Family Medicine

## 2022-12-21 ENCOUNTER — Encounter: Payer: Self-pay | Admitting: Family Medicine

## 2022-12-21 ENCOUNTER — Other Ambulatory Visit (HOSPITAL_COMMUNITY)
Admission: RE | Admit: 2022-12-21 | Discharge: 2022-12-21 | Disposition: A | Discharge status: Home / Self Care | Payer: 59 | Source: Ambulatory Visit | Attending: Family Medicine | Admitting: Family Medicine

## 2022-12-21 VITALS — BP 120/70 | HR 90 | Temp 98.2°F | Resp 12 | Ht 64.0 in | Wt 155.2 lb

## 2022-12-21 DIAGNOSIS — Z124 Encounter for screening for malignant neoplasm of cervix: Secondary | ICD-10-CM | POA: Insufficient documentation

## 2022-12-21 DIAGNOSIS — R062 Wheezing: Secondary | ICD-10-CM | POA: Diagnosis not present

## 2022-12-21 DIAGNOSIS — Z008 Encounter for other general examination: Secondary | ICD-10-CM

## 2022-12-21 DIAGNOSIS — Z1329 Encounter for screening for other suspected endocrine disorder: Secondary | ICD-10-CM | POA: Diagnosis not present

## 2022-12-21 DIAGNOSIS — Z0001 Encounter for general adult medical examination with abnormal findings: Secondary | ICD-10-CM | POA: Diagnosis not present

## 2022-12-21 DIAGNOSIS — Z130 Encounter for screening for diseases of the blood and blood-forming organs and certain disorders involving the immune mechanism: Secondary | ICD-10-CM

## 2022-12-21 DIAGNOSIS — Z13228 Encounter for screening for other metabolic disorders: Secondary | ICD-10-CM

## 2022-12-21 DIAGNOSIS — Z0000 Encounter for general adult medical examination without abnormal findings: Secondary | ICD-10-CM | POA: Insufficient documentation

## 2022-12-21 DIAGNOSIS — Z13220 Encounter for screening for lipoid disorders: Secondary | ICD-10-CM

## 2022-12-21 LAB — BASIC METABOLIC PANEL
BUN: 14 mg/dL (ref 6–23)
CO2: 28 mEq/L (ref 19–32)
Calcium: 9.5 mg/dL (ref 8.4–10.5)
Chloride: 101 mEq/L (ref 96–112)
Creatinine, Ser: 0.65 mg/dL (ref 0.40–1.20)
GFR: 118.99 mL/min (ref >60.00)
Glucose, Bld: 87 mg/dL (ref 70–99)
Potassium: 3.8 mEq/L (ref 3.5–5.1)
Sodium: 137 mEq/L (ref 135–145)

## 2022-12-21 LAB — LIPID PANEL
Cholesterol: 166 mg/dL (ref 0–200)
HDL: 53.6 mg/dL (ref >39.00)
LDL Cholesterol: 99 mg/dL (ref 0–99)
NonHDL: 112.88
Total CHOL/HDL Ratio: 3
Triglycerides: 68 mg/dL (ref 0.0–149.0)
VLDL: 13.6 mg/dL (ref 0.0–40.0)

## 2022-12-21 MED ORDER — FLUTICASONE PROPIONATE HFA 44 MCG/ACT IN AERO: 2.0000 | INHALATION_SPRAY | Freq: Two times a day (BID) | RESPIRATORY_TRACT | 3 refills | Status: AC

## 2022-12-21 MED ORDER — ALBUTEROL SULFATE HFA 108 (90 BASE) MCG/ACT IN AERS: 2.0000 | INHALATION_SPRAY | Freq: Four times a day (QID) | RESPIRATORY_TRACT | 0 refills | Status: AC | PRN

## 2022-12-21 NOTE — Assessment & Plan Note (Signed)
We discussed possible etiologies. ? Asthma. Options: Pulmonology referral, PFT's,or empiric treatment for possible asthma, she prefers to start with the latter one. Flovent 44 mcg 2 puff bid. Swish after use. Albuterol inh 2 puff qid prn. CXR ordered. F/U in 3 months, before if needed.

## 2022-12-21 NOTE — Assessment & Plan Note (Signed)
We discussed the importance of regular physical activity and healthy diet for prevention of chronic illness and/or complications. Preventive guidelines reviewed. Vaccination up to date. Pap collected today. She is planning on establishing with gyn but the time IUD needs to be changed. Next CPE in a year.

## 2022-12-21 NOTE — Patient Instructions (Addendum)
A few things to remember from today's visit:  Routine general medical examination at a health care facility  Screening for lipoid disorders - Plan: Lipid panel  Screening for endocrine, metabolic and immunity disorder - Plan: Basic metabolic panel  Screening for cervical cancer - Plan: PAP [[facility]]  Wheezing - Plan: DG Chest 2 View  Encounter for biometric screening For possible asthma, Flovent and Albuterol inh added today. Swish after using Flovent.  If you need refills for medications you take chronically, please call your pharmacy. Do not use My Chart to request refills or for acute issues that need immediate attention. If you send a my chart message, it may take a few days to be addressed, specially if I am not in the office.  Please be sure medication list is accurate. If a new problem present, please set up appointment sooner than planned today. Preventive Care 30-30 Years Old, Female Preventive care refers to lifestyle choices and visits with your health care provider that can promote health and wellness. Preventive care visits are also called wellness exams. What can I expect for my preventive care visit? Counseling During your preventive care visit, your health care provider may ask about your: Medical history, including: Past medical problems. Family medical history. Pregnancy history. Current health, including: Menstrual cycle. Method of birth control. Emotional well-being. Home life and relationship well-being. Sexual activity and sexual health. Lifestyle, including: Alcohol, nicotine or tobacco, and drug use. Access to firearms. Diet, exercise, and sleep habits. Work and work Statistician. Sunscreen use. Safety issues such as seatbelt and bike helmet use. Physical exam Your health care provider may check your: Height and weight. These may be used to calculate your BMI (body mass index). BMI is a measurement that tells if you are at a healthy  weight. Waist circumference. This measures the distance around your waistline. This measurement also tells if you are at a healthy weight and may help predict your risk of certain diseases, such as type 2 diabetes and high blood pressure. Heart rate and blood pressure. Body temperature. Skin for abnormal spots. What immunizations do I need?  Vaccines are usually given at various ages, according to a schedule. Your health care provider will recommend vaccines for you based on your age, medical history, and lifestyle or other factors, such as travel or where you work. What tests do I need? Screening Your health care provider may recommend screening tests for certain conditions. This may include: Pelvic exam and Pap test. Lipid and cholesterol levels. Diabetes screening. This is done by checking your blood sugar (glucose) after you have not eaten for a while (fasting). Hepatitis B test. Hepatitis C test. HIV (human immunodeficiency virus) test. STI (sexually transmitted infection) testing, if you are at risk. BRCA-related cancer screening. This may be done if you have a family history of breast, ovarian, tubal, or peritoneal cancers. Talk with your health care provider about your test results, treatment options, and if necessary, the need for more tests. Follow these instructions at home: Eating and drinking  Eat a healthy diet that includes fresh fruits and vegetables, whole grains, lean protein, and low-fat dairy products. Take vitamin and mineral supplements as recommended by your health care provider. Do not drink alcohol if: Your health care provider tells you not to drink. You are pregnant, may be pregnant, or are planning to become pregnant. If you drink alcohol: Limit how much you have to 0-1 drink a day. Know how much alcohol is in your drink. In the  [place] one drink equals one 12 oz bottle of beer (355 mL), one 5 oz glass of wine (148 mL), or one 1 oz glass of hard liquor (44  mL). Lifestyle Brush your teeth every morning and night with fluoride toothpaste. Floss one time each day. Exercise for at least 30 minutes 5 or more days each week. Do not use any products that contain nicotine or tobacco. These products include cigarettes, chewing tobacco, and vaping devices, such as e-cigarettes. If you need help quitting, ask your health care provider. Do not use drugs. If you are sexually active, practice safe sex. Use a condom or other form of protection to prevent STIs. If you do not wish to become pregnant, use a form of birth control. If you plan to become pregnant, see your health care provider for a prepregnancy visit. Find healthy ways to manage stress, such as: Meditation, yoga, or listening to music. Journaling. Talking to a trusted person. Spending time with friends and family. Minimize exposure to UV radiation to reduce your risk of skin cancer. Safety Always wear your seat belt while driving or riding in a vehicle. Do not drive: If you have been drinking alcohol. Do not ride with someone who has been drinking. If you have been using any mind-altering substances or drugs. While texting. When you are tired or distracted. Wear a helmet and other protective equipment during sports activities. If you have firearms in your house, make sure you follow all gun safety procedures. Seek help if you have been physically or sexually abused. What's next? Go to your health care provider once a year for an annual wellness visit. Ask your health care provider how often you should have your eyes and teeth checked. Stay up to date on all vaccines. This information is not intended to replace advice given to you by your health care provider. Make sure you discuss any questions you have with your health care provider. Document Revised: 05/21/2021 Document Reviewed: 05/21/2021 Elsevier Patient Education  [place].

## 2022-12-23 LAB — CYTOLOGY - PAP: Diagnosis: NEGATIVE

## 2023-02-25 ENCOUNTER — Encounter: Payer: Self-pay | Admitting: Family Medicine

## 2023-04-07 ENCOUNTER — Encounter: Payer: Self-pay | Admitting: Family Medicine

## 2023-04-07 DIAGNOSIS — L309 Dermatitis, unspecified: Secondary | ICD-10-CM

## 2023-04-09 MED ORDER — TRIAMCINOLONE ACETONIDE 0.1 % EX CREA: 1.0000 | TOPICAL_CREAM | Freq: Every day | CUTANEOUS | 2 refills | Status: AC | PRN
